# Patient Record
Sex: Male | Born: 1950
Health system: Southern US, Community
[De-identification: ages and names within clinical notes are randomized; demographics above are authoritative.]

## PROBLEM LIST (undated history)

## (undated) DIAGNOSIS — E785 Hyperlipidemia, unspecified: Secondary | ICD-10-CM

## (undated) DIAGNOSIS — I4430 Unspecified atrioventricular block: Secondary | ICD-10-CM

## (undated) DIAGNOSIS — D696 Thrombocytopenia, unspecified: Secondary | ICD-10-CM

## (undated) DIAGNOSIS — Z720 Tobacco use: Secondary | ICD-10-CM

## (undated) DIAGNOSIS — F329 Major depressive disorder, single episode, unspecified: Secondary | ICD-10-CM

## (undated) DIAGNOSIS — G4733 Obstructive sleep apnea (adult) (pediatric): Secondary | ICD-10-CM

## (undated) DIAGNOSIS — F32A Depression, unspecified: Secondary | ICD-10-CM

## (undated) DIAGNOSIS — G4730 Sleep apnea, unspecified: Secondary | ICD-10-CM

## (undated) DIAGNOSIS — R001 Bradycardia, unspecified: Secondary | ICD-10-CM

## (undated) DIAGNOSIS — Z5189 Encounter for other specified aftercare: Secondary | ICD-10-CM

## (undated) DIAGNOSIS — F17200 Nicotine dependence, unspecified, uncomplicated: Secondary | ICD-10-CM

## (undated) DIAGNOSIS — J449 Chronic obstructive pulmonary disease, unspecified: Secondary | ICD-10-CM

## (undated) DIAGNOSIS — R000 Tachycardia, unspecified: Secondary | ICD-10-CM

## (undated) DIAGNOSIS — R6882 Decreased libido: Secondary | ICD-10-CM

## (undated) DIAGNOSIS — I10 Essential (primary) hypertension: Secondary | ICD-10-CM

## (undated) DIAGNOSIS — I2510 Atherosclerotic heart disease of native coronary artery without angina pectoris: Secondary | ICD-10-CM

## (undated) HISTORY — DX: Obstructive sleep apnea (adult) (pediatric): G47.33

## (undated) HISTORY — DX: Unspecified atrioventricular block: I44.30

## (undated) HISTORY — PX: HIP SURGERY: SHX245

## (undated) HISTORY — DX: Tachycardia, unspecified: R00.0

## (undated) HISTORY — DX: Hyperlipidemia, unspecified: E78.5

## (undated) HISTORY — DX: Sleep apnea, unspecified: G47.30

## (undated) HISTORY — DX: Encounter for other specified aftercare: Z51.89

## (undated) HISTORY — DX: Decreased libido: R68.82

## (undated) HISTORY — DX: Nicotine dependence, unspecified, uncomplicated: F17.200

## (undated) HISTORY — DX: Tobacco use: Z72.0

## (undated) HISTORY — DX: Morbid (severe) obesity due to excess calories: E66.01

## (undated) HISTORY — DX: Bradycardia, unspecified: R00.1

## (undated) HISTORY — DX: Essential (primary) hypertension: I10

## (undated) HISTORY — DX: Depression, unspecified: F32.A

## (undated) HISTORY — PX: OTHER SURGICAL HISTORY: SHX169

## (undated) HISTORY — DX: Major depressive disorder, single episode, unspecified: F32.9

---
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
## 2000-10-01 ENCOUNTER — Emergency Department (HOSPITAL_COMMUNITY): Admission: EM | Admit: 2000-10-01 | Discharge: 2000-10-01 | Payer: Self-pay | Admitting: Emergency Medicine

## 2006-05-22 HISTORY — PX: COLONOSCOPY: SHX174

## 2007-03-13 ENCOUNTER — Ambulatory Visit: Payer: Self-pay | Admitting: Oncology

## 2007-03-20 LAB — CBC WITH DIFFERENTIAL/PLATELET
BASO%: 0 % (ref 0.0–2.0)
EOS%: 1.7 % (ref 0.0–7.0)
HGB: 13.7 g/dL (ref 13.0–17.1)
MCH: 32 pg (ref 28.0–33.4)
MCHC: 34.2 g/dL (ref 32.0–35.9)
RBC: 4.28 10*6/uL (ref 4.20–5.71)
RDW: 14.2 % (ref 11.2–14.6)
lymph#: 3.2 10*3/uL (ref 0.9–3.3)

## 2007-03-20 LAB — CHCC SMEAR

## 2007-04-01 LAB — COMPREHENSIVE METABOLIC PANEL
ALT: 35 U/L (ref 0–53)
AST: 22 U/L (ref 0–37)
Albumin: 4.3 g/dL (ref 3.5–5.2)
Alkaline Phosphatase: 43 U/L (ref 39–117)
Calcium: 9.4 mg/dL (ref 8.4–10.5)
Chloride: 104 mEq/L (ref 96–112)
Potassium: 4.2 mEq/L (ref 3.5–5.3)
Sodium: 144 mEq/L (ref 135–145)

## 2007-05-14 ENCOUNTER — Ambulatory Visit: Payer: Self-pay | Admitting: Oncology

## 2007-05-16 ENCOUNTER — Ambulatory Visit (HOSPITAL_COMMUNITY): Admission: RE | Admit: 2007-05-16 | Discharge: 2007-05-16 | Payer: Self-pay | Admitting: Oncology

## 2007-05-16 LAB — CBC WITH DIFFERENTIAL/PLATELET
Basophils Absolute: 0 10*3/uL (ref 0.0–0.1)
Eosinophils Absolute: 0.2 10*3/uL (ref 0.0–0.5)
HCT: 42 % (ref 38.7–49.9)
HGB: 14.3 g/dL (ref 13.0–17.1)
LYMPH%: 47.9 % (ref 14.0–48.0)
MCV: 94.6 fL (ref 81.6–98.0)
MONO%: 5.3 % (ref 0.0–13.0)
NEUT#: 3 10*3/uL (ref 1.5–6.5)
NEUT%: 43.2 % (ref 40.0–75.0)
Platelets: 719 10*3/uL — ABNORMAL HIGH (ref 145–400)

## 2007-05-29 LAB — CBC WITH DIFFERENTIAL/PLATELET
BASO%: 0.5 % (ref 0.0–2.0)
HCT: 41.1 % (ref 38.7–49.9)
HGB: 14.1 g/dL (ref 13.0–17.1)
MCHC: 34.4 g/dL (ref 32.0–35.9)
MONO#: 0.4 10*3/uL (ref 0.1–0.9)
NEUT%: 45.7 % (ref 40.0–75.0)
WBC: 5.5 10*3/uL (ref 4.0–10.0)
lymph#: 2.5 10*3/uL (ref 0.9–3.3)

## 2007-05-29 LAB — COMPREHENSIVE METABOLIC PANEL
ALT: 27 U/L (ref 0–53)
Albumin: 4.6 g/dL (ref 3.5–5.2)
CO2: 25 mEq/L (ref 19–32)
Calcium: 9.7 mg/dL (ref 8.4–10.5)
Chloride: 107 mEq/L (ref 96–112)
Creatinine, Ser: 1.55 mg/dL — ABNORMAL HIGH (ref 0.40–1.50)
Potassium: 4.6 mEq/L (ref 3.5–5.3)
Total Protein: 7 g/dL (ref 6.0–8.3)

## 2007-05-29 LAB — LACTATE DEHYDROGENASE: LDH: 203 U/L (ref 94–250)

## 2007-06-12 LAB — COMPREHENSIVE METABOLIC PANEL
ALT: 32 U/L (ref 0–53)
AST: 19 U/L (ref 0–37)
Albumin: 4.3 g/dL (ref 3.5–5.2)
BUN: 22 mg/dL (ref 6–23)
Calcium: 9.7 mg/dL (ref 8.4–10.5)
Chloride: 107 mEq/L (ref 96–112)
Potassium: 4.5 mEq/L (ref 3.5–5.3)
Total Protein: 6.7 g/dL (ref 6.0–8.3)

## 2007-06-12 LAB — CBC WITH DIFFERENTIAL/PLATELET
BASO%: 0.6 % (ref 0.0–2.0)
Basophils Absolute: 0 10*3/uL (ref 0.0–0.1)
EOS%: 2.2 % (ref 0.0–7.0)
HGB: 13.8 g/dL (ref 13.0–17.1)
MCH: 32.3 pg (ref 28.0–33.4)
NEUT#: 3.4 10*3/uL (ref 1.5–6.5)
RDW: 14.4 % (ref 11.2–14.6)
lymph#: 3.1 10*3/uL (ref 0.9–3.3)

## 2007-06-19 LAB — CBC WITH DIFFERENTIAL/PLATELET
Basophils Absolute: 0 10*3/uL (ref 0.0–0.1)
EOS%: 2.2 % (ref 0.0–7.0)
HCT: 39.4 % (ref 38.7–49.9)
HGB: 13.5 g/dL (ref 13.0–17.1)
MCH: 32.3 pg (ref 28.0–33.4)
MCV: 94.1 fL (ref 81.6–98.0)
MONO%: 6.6 % (ref 0.0–13.0)
NEUT%: 38.5 % — ABNORMAL LOW (ref 40.0–75.0)

## 2007-06-24 ENCOUNTER — Ambulatory Visit: Payer: Self-pay | Admitting: Oncology

## 2007-06-26 LAB — CBC WITH DIFFERENTIAL/PLATELET
Basophils Absolute: 0.1 10*3/uL (ref 0.0–0.1)
EOS%: 2 % (ref 0.0–7.0)
HGB: 14.3 g/dL (ref 13.0–17.1)
LYMPH%: 48.3 % — ABNORMAL HIGH (ref 14.0–48.0)
MCH: 32.9 pg (ref 28.0–33.4)
MCV: 94.8 fL (ref 81.6–98.0)
MONO%: 6 % (ref 0.0–13.0)
RDW: 14.9 % — ABNORMAL HIGH (ref 11.2–14.6)

## 2007-06-26 LAB — COMPREHENSIVE METABOLIC PANEL
AST: 21 U/L (ref 0–37)
Albumin: 4.5 g/dL (ref 3.5–5.2)
Alkaline Phosphatase: 45 U/L (ref 39–117)
BUN: 23 mg/dL (ref 6–23)
Creatinine, Ser: 1.47 mg/dL (ref 0.40–1.50)
Potassium: 4.3 mEq/L (ref 3.5–5.3)
Total Bilirubin: 0.3 mg/dL (ref 0.3–1.2)

## 2007-07-10 LAB — CBC WITH DIFFERENTIAL/PLATELET
Basophils Absolute: 0 10*3/uL (ref 0.0–0.1)
EOS%: 2.1 % (ref 0.0–7.0)
Eosinophils Absolute: 0.1 10*3/uL (ref 0.0–0.5)
HGB: 13.9 g/dL (ref 13.0–17.1)
LYMPH%: 48.2 % — ABNORMAL HIGH (ref 14.0–48.0)
MCH: 33 pg (ref 28.0–33.4)
MCV: 95.9 fL (ref 81.6–98.0)
MONO%: 6.4 % (ref 0.0–13.0)
Platelets: 662 10*3/uL — ABNORMAL HIGH (ref 145–400)
RDW: 15.6 % — ABNORMAL HIGH (ref 11.2–14.6)

## 2007-07-17 LAB — CBC WITH DIFFERENTIAL/PLATELET
BASO%: 0.3 % (ref 0.0–2.0)
EOS%: 1.5 % (ref 0.0–7.0)
Eosinophils Absolute: 0.1 10*3/uL (ref 0.0–0.5)
LYMPH%: 47.1 % (ref 14.0–48.0)
MCH: 33.5 pg — ABNORMAL HIGH (ref 28.0–33.4)
MCHC: 34.8 g/dL (ref 32.0–35.9)
MCV: 96.3 fL (ref 81.6–98.0)
MONO%: 7.2 % (ref 0.0–13.0)
NEUT#: 2.9 10*3/uL (ref 1.5–6.5)
Platelets: 643 10*3/uL — ABNORMAL HIGH (ref 145–400)
RBC: 4.12 10*6/uL — ABNORMAL LOW (ref 4.20–5.71)
RDW: 15.5 % — ABNORMAL HIGH (ref 11.2–14.6)

## 2007-07-24 LAB — CBC WITH DIFFERENTIAL/PLATELET
BASO%: 0.5 % (ref 0.0–2.0)
Eosinophils Absolute: 0.1 10*3/uL (ref 0.0–0.5)
LYMPH%: 44.9 % (ref 14.0–48.0)
MCHC: 35.1 g/dL (ref 32.0–35.9)
MONO#: 0.4 10*3/uL (ref 0.1–0.9)
NEUT#: 2.8 10*3/uL (ref 1.5–6.5)
Platelets: 697 10*3/uL — ABNORMAL HIGH (ref 145–400)
RBC: 4.19 10*6/uL — ABNORMAL LOW (ref 4.20–5.71)
RDW: 15.8 % — ABNORMAL HIGH (ref 11.2–14.6)
WBC: 6.2 10*3/uL (ref 4.0–10.0)
lymph#: 2.8 10*3/uL (ref 0.9–3.3)

## 2007-08-04 ENCOUNTER — Ambulatory Visit: Payer: Self-pay | Admitting: Oncology

## 2007-08-07 LAB — COMPREHENSIVE METABOLIC PANEL
ALT: 27 U/L (ref 0–53)
BUN: 20 mg/dL (ref 6–23)
CO2: 24 mEq/L (ref 19–32)
Calcium: 9.4 mg/dL (ref 8.4–10.5)
Chloride: 107 mEq/L (ref 96–112)
Creatinine, Ser: 1.45 mg/dL (ref 0.40–1.50)
Glucose, Bld: 91 mg/dL (ref 70–99)

## 2007-08-07 LAB — CBC WITH DIFFERENTIAL/PLATELET
Basophils Absolute: 0 10*3/uL (ref 0.0–0.1)
Eosinophils Absolute: 0.1 10*3/uL (ref 0.0–0.5)
HCT: 40.5 % (ref 38.7–49.9)
HGB: 14 g/dL (ref 13.0–17.1)
MONO#: 0.3 10*3/uL (ref 0.1–0.9)
NEUT%: 42.3 % (ref 40.0–75.0)
WBC: 6.5 10*3/uL (ref 4.0–10.0)
lymph#: 3.3 10*3/uL (ref 0.9–3.3)

## 2007-08-07 LAB — LACTATE DEHYDROGENASE: LDH: 186 U/L (ref 94–250)

## 2007-08-14 LAB — CBC WITH DIFFERENTIAL/PLATELET
EOS%: 1.8 % (ref 0.0–7.0)
LYMPH%: 43.4 % (ref 14.0–48.0)
MCH: 33.1 pg (ref 28.0–33.4)
MCHC: 34.7 g/dL (ref 32.0–35.9)
MCV: 95.3 fL (ref 81.6–98.0)
MONO%: 5.4 % (ref 0.0–13.0)
Platelets: 716 10*3/uL — ABNORMAL HIGH (ref 145–400)
RBC: 4.22 10*6/uL (ref 4.20–5.71)
RDW: 15 % — ABNORMAL HIGH (ref 11.2–14.6)

## 2007-09-04 LAB — CBC WITH DIFFERENTIAL/PLATELET
BASO%: 0.3 % (ref 0.0–2.0)
EOS%: 2.6 % (ref 0.0–7.0)
MCH: 32.7 pg (ref 28.0–33.4)
MCHC: 34.1 g/dL (ref 32.0–35.9)
MCV: 95.9 fL (ref 81.6–98.0)
MONO%: 7 % (ref 0.0–13.0)
RBC: 4.09 10*6/uL — ABNORMAL LOW (ref 4.20–5.71)
RDW: 14.4 % (ref 11.2–14.6)
lymph#: 3.2 10*3/uL (ref 0.9–3.3)

## 2007-09-04 LAB — COMPREHENSIVE METABOLIC PANEL
ALT: 28 U/L (ref 0–53)
AST: 25 U/L (ref 0–37)
Albumin: 4.4 g/dL (ref 3.5–5.2)
Alkaline Phosphatase: 45 U/L (ref 39–117)
Calcium: 9.5 mg/dL (ref 8.4–10.5)
Chloride: 107 mEq/L (ref 96–112)
Creatinine, Ser: 1.42 mg/dL (ref 0.40–1.50)
Potassium: 4.1 mEq/L (ref 3.5–5.3)

## 2007-09-11 LAB — CBC WITH DIFFERENTIAL/PLATELET
BASO%: 0.3 % (ref 0.0–2.0)
EOS%: 2.3 % (ref 0.0–7.0)
LYMPH%: 43.7 % (ref 14.0–48.0)
MCHC: 34.1 g/dL (ref 32.0–35.9)
MONO#: 0.3 10*3/uL (ref 0.1–0.9)
Platelets: 614 10*3/uL — ABNORMAL HIGH (ref 145–400)
RBC: 3.99 10*6/uL — ABNORMAL LOW (ref 4.20–5.71)
WBC: 7 10*3/uL (ref 4.0–10.0)
lymph#: 3.1 10*3/uL (ref 0.9–3.3)

## 2007-09-15 ENCOUNTER — Ambulatory Visit: Payer: Self-pay | Admitting: Oncology

## 2007-09-18 LAB — CBC WITH DIFFERENTIAL/PLATELET
BASO%: 1.5 % (ref 0.0–2.0)
HCT: 39 % (ref 38.7–49.9)
MCHC: 34.9 g/dL (ref 32.0–35.9)
MONO#: 0.4 10*3/uL (ref 0.1–0.9)
NEUT%: 41.3 % (ref 40.0–75.0)
RBC: 4.08 10*6/uL — ABNORMAL LOW (ref 4.20–5.71)
RDW: 12.9 % (ref 11.2–14.6)
WBC: 7.5 10*3/uL (ref 4.0–10.0)
lymph#: 3.7 10*3/uL — ABNORMAL HIGH (ref 0.9–3.3)

## 2007-09-25 LAB — CBC WITH DIFFERENTIAL/PLATELET
BASO%: 0.5 % (ref 0.0–2.0)
EOS%: 2.2 % (ref 0.0–7.0)
MCH: 32.9 pg (ref 28.0–33.4)
MCHC: 34.4 g/dL (ref 32.0–35.9)
MONO%: 6.3 % (ref 0.0–13.0)
NEUT%: 46 % (ref 40.0–75.0)
RDW: 14 % (ref 11.2–14.6)
lymph#: 3 10*3/uL (ref 0.9–3.3)

## 2007-10-02 LAB — CBC WITH DIFFERENTIAL/PLATELET
BASO%: 0.3 % (ref 0.0–2.0)
Basophils Absolute: 0 10*3/uL (ref 0.0–0.1)
EOS%: 2.6 % (ref 0.0–7.0)
HGB: 13.8 g/dL (ref 13.0–17.1)
MCH: 32.7 pg (ref 28.0–33.4)
RBC: 4.2 10*6/uL (ref 4.20–5.71)
RDW: 13.8 % (ref 11.2–14.6)
lymph#: 3.3 10*3/uL (ref 0.9–3.3)

## 2007-10-02 LAB — COMPREHENSIVE METABOLIC PANEL
ALT: 22 U/L (ref 0–53)
AST: 18 U/L (ref 0–37)
Albumin: 4.4 g/dL (ref 3.5–5.2)
Alkaline Phosphatase: 47 U/L (ref 39–117)
BUN: 23 mg/dL (ref 6–23)
Calcium: 9.5 mg/dL (ref 8.4–10.5)
Chloride: 108 mEq/L (ref 96–112)
Potassium: 4.1 mEq/L (ref 3.5–5.3)
Sodium: 144 mEq/L (ref 135–145)
Total Protein: 6.9 g/dL (ref 6.0–8.3)

## 2007-10-16 LAB — CBC WITH DIFFERENTIAL/PLATELET
Basophils Absolute: 0 10*3/uL (ref 0.0–0.1)
Eosinophils Absolute: 0.2 10*3/uL (ref 0.0–0.5)
HGB: 13.5 g/dL (ref 13.0–17.1)
LYMPH%: 44.7 % (ref 14.0–48.0)
MCV: 95 fL (ref 81.6–98.0)
MONO%: 7.8 % (ref 0.0–13.0)
NEUT#: 3.8 10*3/uL (ref 1.5–6.5)
Platelets: 590 10*3/uL — ABNORMAL HIGH (ref 145–400)

## 2007-10-27 ENCOUNTER — Ambulatory Visit: Payer: Self-pay | Admitting: Oncology

## 2007-10-30 LAB — CBC WITH DIFFERENTIAL/PLATELET
BASO%: 1 % (ref 0.0–2.0)
LYMPH%: 41.1 % (ref 14.0–48.0)
MCHC: 33.5 g/dL (ref 32.0–35.9)
MCV: 94.9 fL (ref 81.6–98.0)
MONO%: 6.1 % (ref 0.0–13.0)
Platelets: 497 10*3/uL — ABNORMAL HIGH (ref 145–400)
RBC: 4.2 10*6/uL (ref 4.20–5.71)

## 2007-11-20 LAB — CBC WITH DIFFERENTIAL/PLATELET
Basophils Absolute: 0 10*3/uL (ref 0.0–0.1)
Eosinophils Absolute: 0.3 10*3/uL (ref 0.0–0.5)
HCT: 37.9 % — ABNORMAL LOW (ref 38.7–49.9)
HGB: 12.8 g/dL — ABNORMAL LOW (ref 13.0–17.1)
MCV: 95.4 fL (ref 81.6–98.0)
MONO%: 6 % (ref 0.0–13.0)
NEUT#: 3.9 10*3/uL (ref 1.5–6.5)
RDW: 13.9 % (ref 11.2–14.6)
lymph#: 3.3 10*3/uL (ref 0.9–3.3)

## 2007-12-08 ENCOUNTER — Ambulatory Visit: Payer: Self-pay | Admitting: Oncology

## 2008-01-01 LAB — CBC WITH DIFFERENTIAL/PLATELET
Eosinophils Absolute: 0.1 10*3/uL (ref 0.0–0.5)
HCT: 39 % (ref 38.7–49.9)
LYMPH%: 51.2 % — ABNORMAL HIGH (ref 14.0–48.0)
MCHC: 33.9 g/dL (ref 32.0–35.9)
MCV: 94.1 fL (ref 81.6–98.0)
MONO#: 0.5 10*3/uL (ref 0.1–0.9)
MONO%: 6.9 % (ref 0.0–13.0)
NEUT#: 2.7 10*3/uL (ref 1.5–6.5)
NEUT%: 39.6 % — ABNORMAL LOW (ref 40.0–75.0)
Platelets: 563 10*3/uL — ABNORMAL HIGH (ref 145–400)
WBC: 6.7 10*3/uL (ref 4.0–10.0)

## 2008-01-01 LAB — COMPREHENSIVE METABOLIC PANEL
CO2: 26 mEq/L (ref 19–32)
Creatinine, Ser: 1.39 mg/dL (ref 0.40–1.50)
Glucose, Bld: 89 mg/dL (ref 70–99)
Total Bilirubin: 0.4 mg/dL (ref 0.3–1.2)

## 2008-01-22 LAB — CBC WITH DIFFERENTIAL/PLATELET
Basophils Absolute: 0 10*3/uL (ref 0.0–0.1)
Eosinophils Absolute: 0.2 10*3/uL (ref 0.0–0.5)
HGB: 13.3 g/dL (ref 13.0–17.1)
LYMPH%: 46.6 % (ref 14.0–48.0)
MCV: 95.3 fL (ref 81.6–98.0)
MONO#: 0.5 10*3/uL (ref 0.1–0.9)
NEUT#: 3.1 10*3/uL (ref 1.5–6.5)
Platelets: 551 10*3/uL — ABNORMAL HIGH (ref 145–400)
RBC: 4.14 10*6/uL — ABNORMAL LOW (ref 4.20–5.71)
WBC: 7.2 10*3/uL (ref 4.0–10.0)

## 2008-02-10 ENCOUNTER — Ambulatory Visit: Payer: Self-pay | Admitting: Oncology

## 2008-02-12 LAB — CBC WITH DIFFERENTIAL/PLATELET
BASO%: 0.3 % (ref 0.0–2.0)
EOS%: 2.3 % (ref 0.0–7.0)
LYMPH%: 48.5 % — ABNORMAL HIGH (ref 14.0–48.0)
MCH: 32.3 pg (ref 28.0–33.4)
MCHC: 34.2 g/dL (ref 32.0–35.9)
MCV: 94.4 fL (ref 81.6–98.0)
MONO#: 0.4 10*3/uL (ref 0.1–0.9)
MONO%: 5.6 % (ref 0.0–13.0)
NEUT%: 43.3 % (ref 40.0–75.0)
Platelets: 585 10*3/uL — ABNORMAL HIGH (ref 145–400)
RBC: 4.12 10*6/uL — ABNORMAL LOW (ref 4.20–5.71)
WBC: 6.7 10*3/uL (ref 4.0–10.0)

## 2008-03-08 LAB — CBC WITH DIFFERENTIAL/PLATELET
BASO%: 0.6 % (ref 0.0–2.0)
EOS%: 1.7 % (ref 0.0–7.0)
MCH: 32.1 pg (ref 28.0–33.4)
MCHC: 33.8 g/dL (ref 32.0–35.9)
MONO#: 0.5 10*3/uL (ref 0.1–0.9)
RBC: 4.26 10*6/uL (ref 4.20–5.71)
RDW: 14.5 % (ref 11.2–14.6)
WBC: 7.1 10*3/uL (ref 4.0–10.0)
lymph#: 3.1 10*3/uL (ref 0.9–3.3)

## 2008-03-08 LAB — COMPREHENSIVE METABOLIC PANEL
AST: 17 U/L (ref 0–37)
Albumin: 4.4 g/dL (ref 3.5–5.2)
Alkaline Phosphatase: 44 U/L (ref 39–117)
BUN: 20 mg/dL (ref 6–23)
Creatinine, Ser: 1.24 mg/dL (ref 0.40–1.50)
Glucose, Bld: 87 mg/dL (ref 70–99)
Potassium: 4.1 mEq/L (ref 3.5–5.3)
Total Bilirubin: 0.3 mg/dL (ref 0.3–1.2)

## 2008-03-30 ENCOUNTER — Ambulatory Visit: Payer: Self-pay | Admitting: Oncology

## 2008-04-05 LAB — CBC WITH DIFFERENTIAL/PLATELET
Eosinophils Absolute: 0.1 10*3/uL (ref 0.0–0.5)
HCT: 39.8 % (ref 38.7–49.9)
LYMPH%: 48.5 % — ABNORMAL HIGH (ref 14.0–48.0)
MCV: 94.5 fL (ref 81.6–98.0)
MONO%: 5.1 % (ref 0.0–13.0)
NEUT#: 3 10*3/uL (ref 1.5–6.5)
NEUT%: 44.6 % (ref 40.0–75.0)
Platelets: 616 10*3/uL — ABNORMAL HIGH (ref 145–400)
RBC: 4.21 10*6/uL (ref 4.20–5.71)

## 2008-04-14 ENCOUNTER — Encounter: Payer: Self-pay | Admitting: Family Medicine

## 2008-05-05 LAB — CBC WITH DIFFERENTIAL/PLATELET
Basophils Absolute: 0 10*3/uL (ref 0.0–0.1)
Eosinophils Absolute: 0.2 10*3/uL (ref 0.0–0.5)
HGB: 13 g/dL (ref 13.0–17.1)
LYMPH%: 41 % (ref 14.0–48.0)
MCV: 94.6 fL (ref 81.6–98.0)
MONO%: 7.7 % (ref 0.0–13.0)
NEUT#: 3.3 10*3/uL (ref 1.5–6.5)
Platelets: 554 10*3/uL — ABNORMAL HIGH (ref 145–400)
RDW: 14.3 % (ref 11.2–14.6)

## 2008-06-01 ENCOUNTER — Ambulatory Visit: Payer: Self-pay | Admitting: Oncology

## 2008-06-03 ENCOUNTER — Encounter: Payer: Self-pay | Admitting: Family Medicine

## 2008-06-03 LAB — CBC WITH DIFFERENTIAL/PLATELET
Basophils Absolute: 0 10*3/uL (ref 0.0–0.1)
EOS%: 2.5 % (ref 0.0–7.0)
HCT: 40.4 % (ref 38.4–49.9)
HGB: 13.6 g/dL (ref 13.0–17.1)
LYMPH%: 45.6 % (ref 14.0–49.0)
MCH: 30.6 pg (ref 27.2–33.4)
MCV: 91 fL (ref 79.3–98.0)
MONO%: 6.9 % (ref 0.0–14.0)
NEUT%: 44.8 % (ref 39.0–75.0)
Platelets: 497 10*3/uL — ABNORMAL HIGH (ref 140–400)

## 2008-06-03 LAB — COMPREHENSIVE METABOLIC PANEL
Alkaline Phosphatase: 53 U/L (ref 39–117)
BUN: 18 mg/dL (ref 6–23)
Creatinine, Ser: 1.31 mg/dL (ref 0.40–1.50)
Glucose, Bld: 67 mg/dL — ABNORMAL LOW (ref 70–99)
Total Bilirubin: 0.3 mg/dL (ref 0.3–1.2)

## 2008-07-22 ENCOUNTER — Ambulatory Visit: Payer: Self-pay | Admitting: Oncology

## 2008-07-26 LAB — CBC WITH DIFFERENTIAL/PLATELET
Basophils Absolute: 0 10*3/uL (ref 0.0–0.1)
EOS%: 2.7 % (ref 0.0–7.0)
HCT: 39.5 % (ref 38.4–49.9)
HGB: 13.2 g/dL (ref 13.0–17.1)
MCH: 31.4 pg (ref 27.2–33.4)
MONO#: 0.3 10*3/uL (ref 0.1–0.9)
NEUT%: 50 % (ref 39.0–75.0)
lymph#: 2.9 10*3/uL (ref 0.9–3.3)

## 2008-08-31 ENCOUNTER — Ambulatory Visit: Payer: Self-pay | Admitting: Oncology

## 2008-09-02 ENCOUNTER — Encounter: Payer: Self-pay | Admitting: Family Medicine

## 2008-09-02 LAB — COMPREHENSIVE METABOLIC PANEL
ALT: 31 U/L (ref 0–53)
CO2: 26 mEq/L (ref 19–32)
Creatinine, Ser: 1.31 mg/dL (ref 0.40–1.50)
Total Bilirubin: 0.3 mg/dL (ref 0.3–1.2)

## 2008-09-02 LAB — CBC WITH DIFFERENTIAL/PLATELET
BASO%: 0.6 % (ref 0.0–2.0)
EOS%: 1.8 % (ref 0.0–7.0)
HCT: 38.1 % — ABNORMAL LOW (ref 38.4–49.9)
LYMPH%: 40.2 % (ref 14.0–49.0)
MCH: 31.6 pg (ref 27.2–33.4)
MCHC: 33.8 g/dL (ref 32.0–36.0)
MCV: 93.6 fL (ref 79.3–98.0)
MONO%: 4.1 % (ref 0.0–14.0)
NEUT%: 53.3 % (ref 39.0–75.0)
Platelets: 451 10*3/uL — ABNORMAL HIGH (ref 140–400)

## 2008-09-02 LAB — LACTATE DEHYDROGENASE: LDH: 190 U/L (ref 94–250)

## 2008-10-04 LAB — CBC WITH DIFFERENTIAL/PLATELET
BASO%: 3.1 % — ABNORMAL HIGH (ref 0.0–2.0)
EOS%: 1.9 % (ref 0.0–7.0)
HCT: 39.5 % (ref 38.4–49.9)
LYMPH%: 41.3 % (ref 14.0–49.0)
MCH: 32 pg (ref 27.2–33.4)
MCHC: 34.1 g/dL (ref 32.0–36.0)
MCV: 93.8 fL (ref 79.3–98.0)
MONO#: 0.4 10*3/uL (ref 0.1–0.9)
MONO%: 6.1 % (ref 0.0–14.0)
NEUT%: 47.6 % (ref 39.0–75.0)
Platelets: 507 10*3/uL — ABNORMAL HIGH (ref 140–400)
RBC: 4.2 10*6/uL (ref 4.20–5.82)
WBC: 6.8 10*3/uL (ref 4.0–10.3)

## 2008-10-28 ENCOUNTER — Ambulatory Visit: Payer: Self-pay | Admitting: Oncology

## 2008-11-02 LAB — CBC WITH DIFFERENTIAL/PLATELET
Basophils Absolute: 0 10*3/uL (ref 0.0–0.1)
EOS%: 2.1 % (ref 0.0–7.0)
Eosinophils Absolute: 0.1 10*3/uL (ref 0.0–0.5)
HGB: 13.7 g/dL (ref 13.0–17.1)
LYMPH%: 42.9 % (ref 14.0–49.0)
MCH: 31.1 pg (ref 27.2–33.4)
MCV: 89.8 fL (ref 79.3–98.0)
MONO%: 7.1 % (ref 0.0–14.0)
NEUT#: 3.2 10*3/uL (ref 1.5–6.5)
Platelets: 503 10*3/uL — ABNORMAL HIGH (ref 140–400)
RDW: 14.7 % — ABNORMAL HIGH (ref 11.0–14.6)

## 2008-12-02 ENCOUNTER — Ambulatory Visit: Payer: Self-pay | Admitting: Oncology

## 2008-12-08 LAB — CBC WITH DIFFERENTIAL/PLATELET
BASO%: 0.8 % (ref 0.0–2.0)
EOS%: 2.6 % (ref 0.0–7.0)
HCT: 41.3 % (ref 38.4–49.9)
LYMPH%: 42.9 % (ref 14.0–49.0)
MCH: 32 pg (ref 27.2–33.4)
MCHC: 33.7 g/dL (ref 32.0–36.0)
MCV: 94.9 fL (ref 79.3–98.0)
MONO%: 9.1 % (ref 0.0–14.0)
NEUT%: 44.6 % (ref 39.0–75.0)
Platelets: 426 10*3/uL — ABNORMAL HIGH (ref 140–400)

## 2009-01-03 ENCOUNTER — Ambulatory Visit: Payer: Self-pay | Admitting: Oncology

## 2009-01-03 LAB — CBC WITH DIFFERENTIAL/PLATELET
BASO%: 0.3 % (ref 0.0–2.0)
EOS%: 3 % (ref 0.0–7.0)
MCH: 32.1 pg (ref 27.2–33.4)
MCHC: 34 g/dL (ref 32.0–36.0)
MCV: 94.7 fL (ref 79.3–98.0)
MONO%: 7.5 % (ref 0.0–14.0)
RDW: 15 % — ABNORMAL HIGH (ref 11.0–14.6)
lymph#: 1.7 10*3/uL (ref 0.9–3.3)

## 2009-01-03 LAB — COMPREHENSIVE METABOLIC PANEL
ALT: 27 U/L (ref 0–53)
AST: 27 U/L (ref 0–37)
Albumin: 3.8 g/dL (ref 3.5–5.2)
Alkaline Phosphatase: 56 U/L (ref 39–117)
Calcium: 8.9 mg/dL (ref 8.4–10.5)
Chloride: 108 mEq/L (ref 96–112)
Creatinine, Ser: 1.32 mg/dL (ref 0.40–1.50)
Potassium: 3.4 mEq/L — ABNORMAL LOW (ref 3.5–5.3)

## 2009-01-04 ENCOUNTER — Encounter: Payer: Self-pay | Admitting: Family Medicine

## 2009-01-28 ENCOUNTER — Ambulatory Visit: Payer: Self-pay | Admitting: Oncology

## 2009-02-02 LAB — CBC WITH DIFFERENTIAL/PLATELET
BASO%: 0.4 % (ref 0.0–2.0)
Basophils Absolute: 0 10*3/uL (ref 0.0–0.1)
HCT: 41.2 % (ref 38.4–49.9)
MCH: 32 pg (ref 27.2–33.4)
NEUT#: 2.8 10*3/uL (ref 1.5–6.5)
NEUT%: 42.8 % (ref 39.0–75.0)
RBC: 4.29 10*6/uL (ref 4.20–5.82)

## 2009-02-25 ENCOUNTER — Ambulatory Visit: Payer: Self-pay | Admitting: Oncology

## 2009-03-28 ENCOUNTER — Ambulatory Visit: Payer: Self-pay | Admitting: Oncology

## 2009-03-31 LAB — CBC WITH DIFFERENTIAL/PLATELET
EOS%: 2.9 % (ref 0.0–7.0)
HGB: 13.7 g/dL (ref 13.0–17.1)
MCH: 31.8 pg (ref 27.2–33.4)
MCHC: 33.7 g/dL (ref 32.0–36.0)
MONO#: 0.5 10*3/uL (ref 0.1–0.9)
MONO%: 5.9 % (ref 0.0–14.0)
NEUT#: 3.7 10*3/uL (ref 1.5–6.5)
WBC: 7.8 10*3/uL (ref 4.0–10.3)

## 2009-04-29 ENCOUNTER — Ambulatory Visit: Payer: Self-pay | Admitting: Oncology

## 2009-05-03 LAB — CBC WITH DIFFERENTIAL/PLATELET
Basophils Absolute: 0.1 10*3/uL (ref 0.0–0.1)
LYMPH%: 36.4 % (ref 14.0–49.0)
MCV: 96.1 fL (ref 79.3–98.0)
MONO%: 7 % (ref 0.0–14.0)
NEUT%: 54 % (ref 39.0–75.0)
RDW: 14.8 % — ABNORMAL HIGH (ref 11.0–14.6)

## 2009-05-03 LAB — COMPREHENSIVE METABOLIC PANEL
Alkaline Phosphatase: 61 U/L (ref 39–117)
BUN: 17 mg/dL (ref 6–23)
Creatinine, Ser: 1.33 mg/dL (ref 0.40–1.50)
Potassium: 5 mEq/L (ref 3.5–5.3)
Sodium: 148 mEq/L — ABNORMAL HIGH (ref 135–145)
Total Protein: 7 g/dL (ref 6.0–8.3)

## 2009-05-03 LAB — LACTATE DEHYDROGENASE: LDH: 168 U/L (ref 94–250)

## 2009-06-29 ENCOUNTER — Ambulatory Visit: Payer: Self-pay | Admitting: Oncology

## 2009-07-01 LAB — CBC WITH DIFFERENTIAL/PLATELET
Basophils Absolute: 0 10*3/uL (ref 0.0–0.1)
Eosinophils Absolute: 0.2 10*3/uL (ref 0.0–0.5)
HCT: 40.8 % (ref 38.4–49.9)
LYMPH%: 45.2 % (ref 14.0–49.0)
MCH: 31.3 pg (ref 27.2–33.4)
MCHC: 33.8 g/dL (ref 32.0–36.0)
MCV: 92.5 fL (ref 79.3–98.0)
MONO#: 0.5 10*3/uL (ref 0.1–0.9)
RDW: 14.1 % (ref 11.0–14.6)
WBC: 7 10*3/uL (ref 4.0–10.3)
lymph#: 3.2 10*3/uL (ref 0.9–3.3)

## 2009-08-14 IMAGING — US US ABDOMEN COMPLETE
1 series · 13 of 25 positions shown · non-contrast
Comparison: No comparison.

CLINICAL DATA: Thrombocytopenia.
 ABDOMEN ULTRASOUND:
TECHNIQUE: Complete abdominal ultrasound examination was performed including evaluation of the liver, gallbladder, bile ducts, pancreas, kidneys, spleen, IVC, and abdominal aorta.

[Series 1: unknown · 0.33mm/px · 13 of 57 slices shown]
[im 1/57]
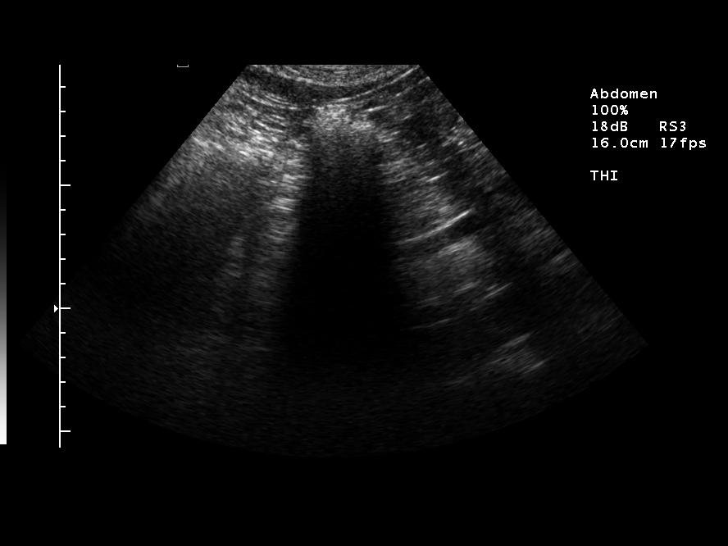
[im 5/57]
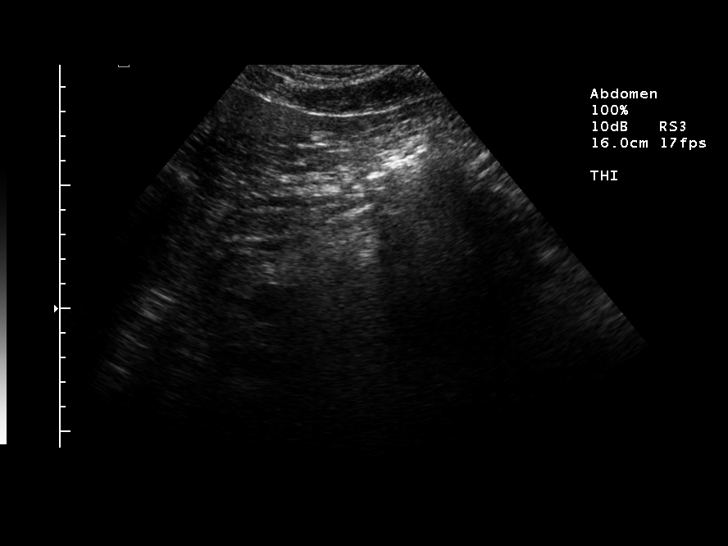
[im 10/57]
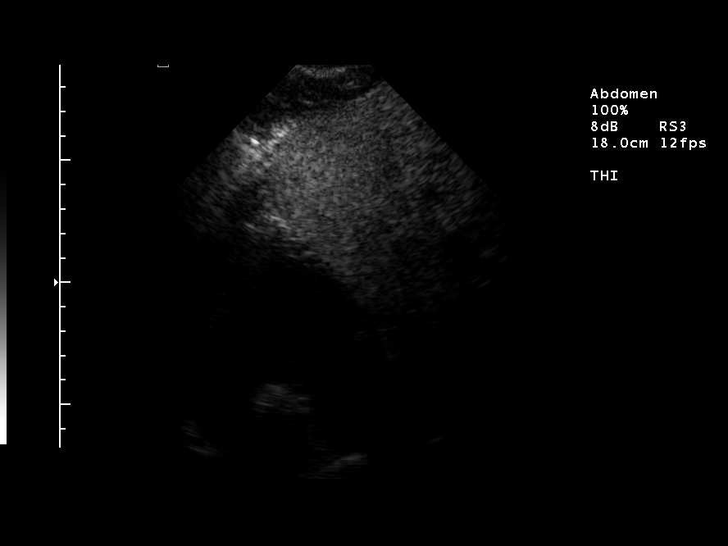
[im 15/57]
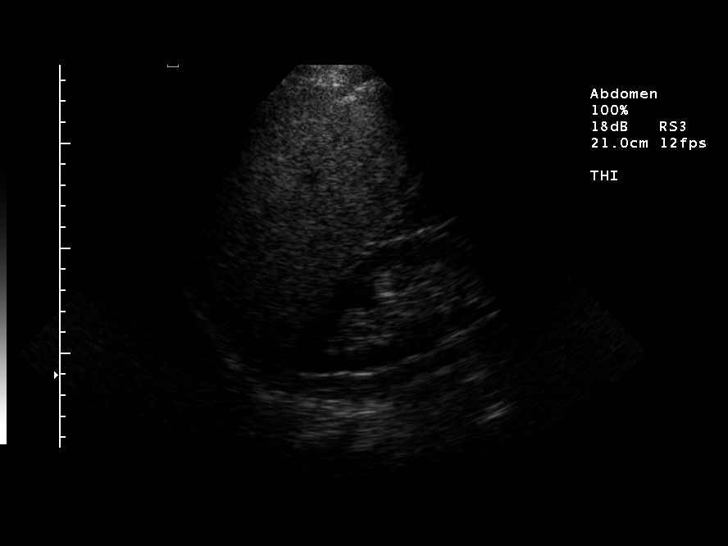
[im 19/57]
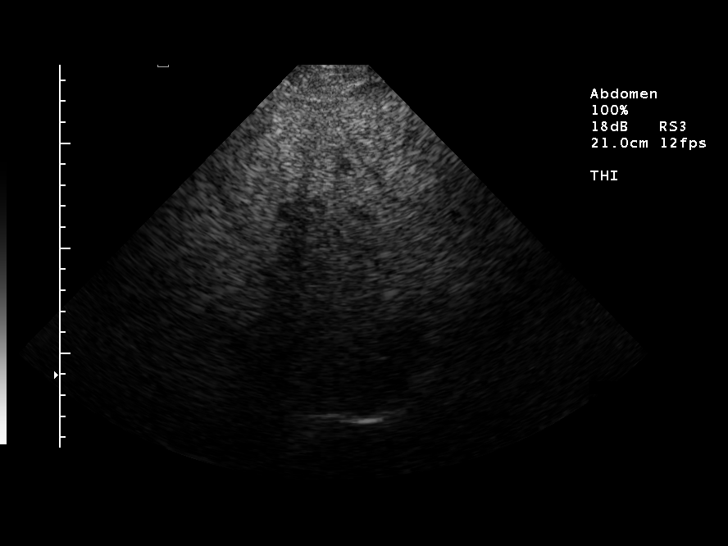
[im 24/57]
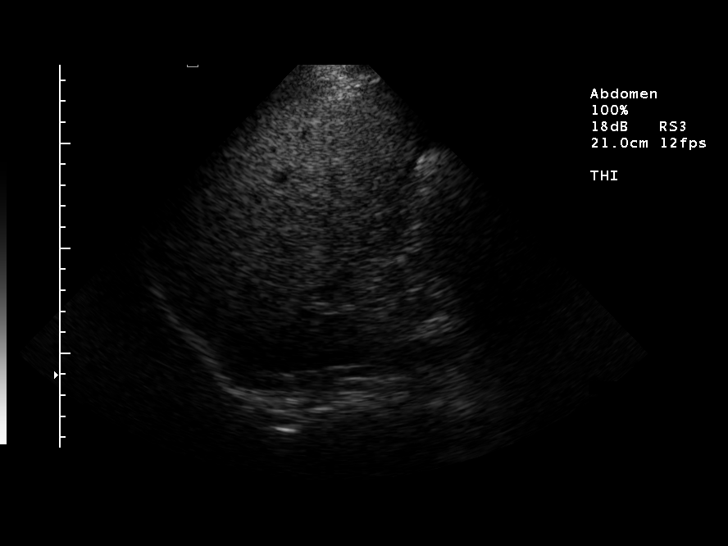
[im 29/57]
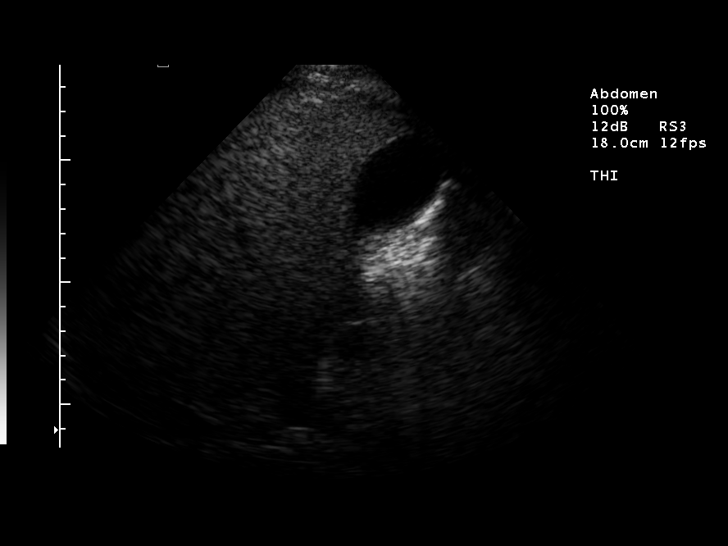
[im 33/57]
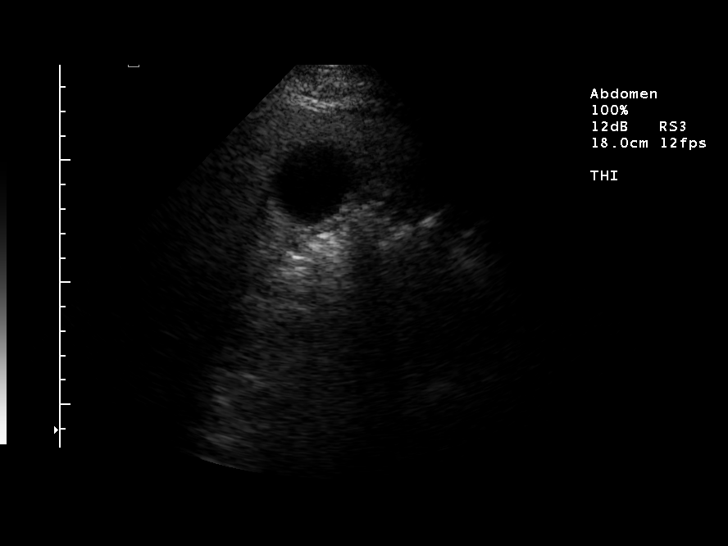
[im 38/57]
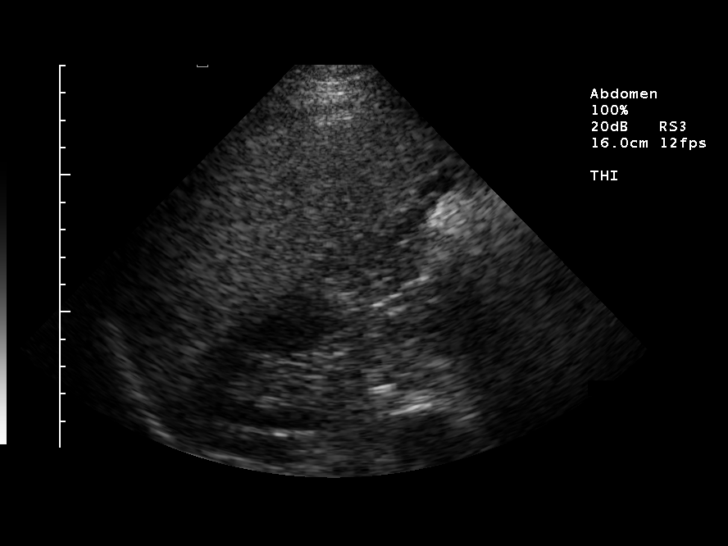
[im 43/57]
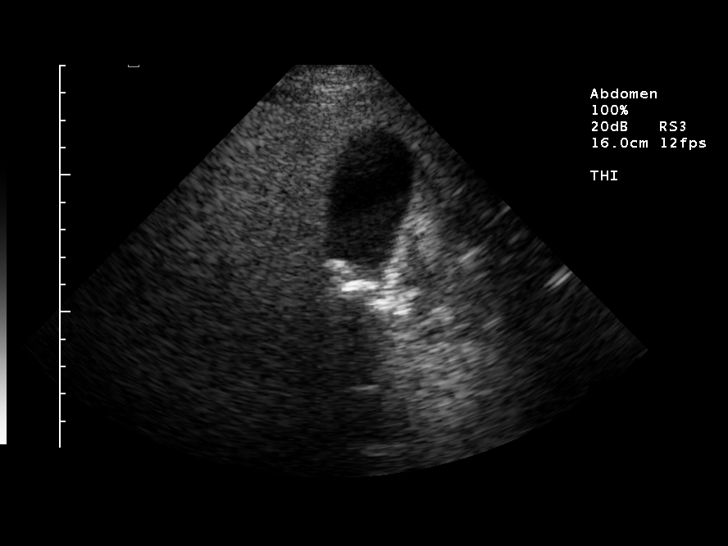
[im 47/57]
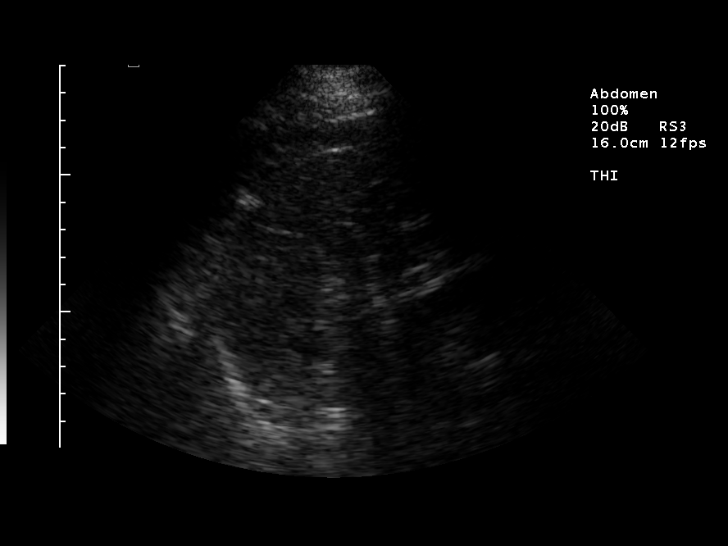
[im 52/57]
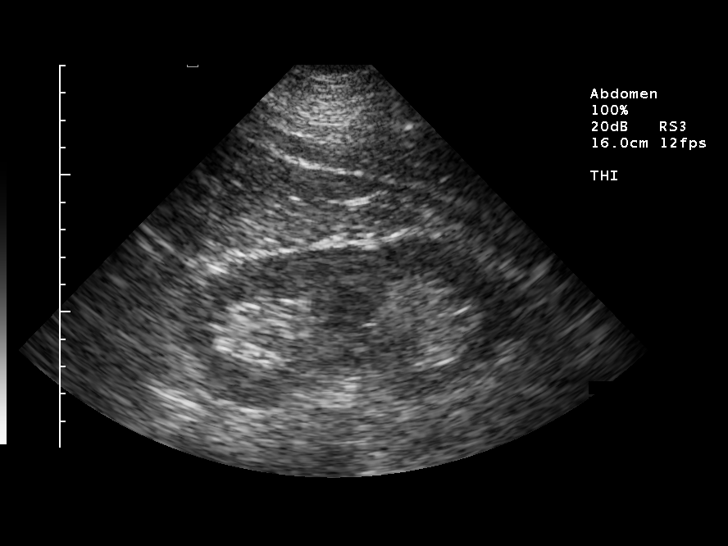
[im 57/57]
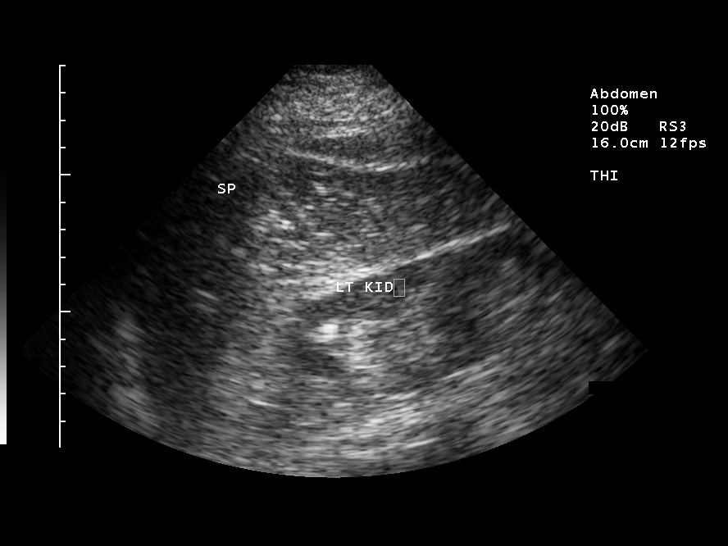

[13 of 25 positions shown; findings below may reference images not displayed]

FINDINGS: The examination is somewhat limited by the patient?s body habitus and intestinal bowel gas.  Small gallstones are present in the gallbladder neck.  These could not be dislodged by turning the patient.  However, the gallbladder does not appear distended and there is no gallbladder wall thickening.  The patient was not tender on imaging the gallbladder.  There is no biliary dilatation.  The liver demonstrates diffusely increased echogenicity without focal abnormality.  This is typically due to fatty infiltration.  The spleen is not enlarged and shows no focal abnormality.  
 The visualized portions of the pancreas, IVC, and abdominal aorta appear normal.  Both kidneys appear normal, measuring 12.5 cm in length on the right and 12.1 cm on the left.
IMPRESSION: 1.  No evidence of splenomegaly. 
 2.  Cholelithiasis.  There are possible stones impacted in the gallbladder neck, although there are no objective or subjective signs of cholecystitis.  Correlate clinically. 
 3.  Probable fatty infiltration of the liver.

## 2009-08-30 ENCOUNTER — Ambulatory Visit: Payer: Self-pay | Admitting: Oncology

## 2009-08-31 LAB — CBC WITH DIFFERENTIAL/PLATELET
EOS%: 2.1 % (ref 0.0–7.0)
LYMPH%: 47.7 % (ref 14.0–49.0)
MONO#: 0.4 10*3/uL (ref 0.1–0.9)
NEUT#: 2.8 10*3/uL (ref 1.5–6.5)
WBC: 6.6 10*3/uL (ref 4.0–10.3)

## 2009-09-21 ENCOUNTER — Emergency Department (HOSPITAL_COMMUNITY): Admission: EM | Admit: 2009-09-21 | Discharge: 2009-09-21 | Payer: Self-pay | Admitting: Emergency Medicine

## 2009-09-29 ENCOUNTER — Ambulatory Visit: Payer: Self-pay | Admitting: Family Medicine

## 2009-09-29 DIAGNOSIS — I10 Essential (primary) hypertension: Secondary | ICD-10-CM

## 2009-09-29 DIAGNOSIS — D759 Disease of blood and blood-forming organs, unspecified: Secondary | ICD-10-CM

## 2009-09-29 DIAGNOSIS — F17200 Nicotine dependence, unspecified, uncomplicated: Secondary | ICD-10-CM | POA: Insufficient documentation

## 2009-09-29 HISTORY — DX: Essential (primary) hypertension: I10

## 2009-09-29 HISTORY — DX: Nicotine dependence, unspecified, uncomplicated: F17.200

## 2009-11-02 ENCOUNTER — Ambulatory Visit: Payer: Self-pay | Admitting: Oncology

## 2009-11-04 ENCOUNTER — Encounter: Payer: Self-pay | Admitting: Family Medicine

## 2009-11-04 LAB — CBC WITH DIFFERENTIAL/PLATELET
BASO%: 0.6 % (ref 0.0–2.0)
HCT: 38.2 % — ABNORMAL LOW (ref 38.4–49.9)
HGB: 13.1 g/dL (ref 13.0–17.1)
MCV: 94.1 fL (ref 79.3–98.0)
MONO%: 6.3 % (ref 0.0–14.0)
Platelets: 428 10*3/uL — ABNORMAL HIGH (ref 140–400)
RBC: 4.06 10*6/uL — ABNORMAL LOW (ref 4.20–5.82)
RDW: 14.5 % (ref 11.0–14.6)

## 2009-11-04 LAB — COMPREHENSIVE METABOLIC PANEL
ALT: 24 U/L (ref 0–53)
AST: 19 U/L (ref 0–37)
Albumin: 4.3 g/dL (ref 3.5–5.2)
BUN: 18 mg/dL (ref 6–23)
CO2: 24 mEq/L (ref 19–32)
Calcium: 9.3 mg/dL (ref 8.4–10.5)
Creatinine, Ser: 1.45 mg/dL (ref 0.40–1.50)
Potassium: 4.2 mEq/L (ref 3.5–5.3)
Sodium: 144 mEq/L (ref 135–145)

## 2009-11-04 LAB — LACTATE DEHYDROGENASE: LDH: 176 U/L (ref 94–250)

## 2009-11-07 ENCOUNTER — Ambulatory Visit: Payer: Self-pay | Admitting: Family Medicine

## 2009-11-07 LAB — CONVERTED CEMR LAB
ALT: 26 units/L (ref 0–53)
AST: 23 units/L (ref 0–37)
Albumin: 4.2 g/dL (ref 3.5–5.2)
Alkaline Phosphatase: 67 units/L (ref 39–117)
BUN: 14 mg/dL (ref 6–23)
Basophils Absolute: 0 10*3/uL (ref 0.0–0.1)
Basophils Relative: 0.3 % (ref 0.0–3.0)
Bilirubin, Direct: 0.1 mg/dL (ref 0.0–0.3)
Blood in Urine, dipstick: NEGATIVE
CO2: 32 meq/L (ref 19–32)
Calcium: 9.3 mg/dL (ref 8.4–10.5)
Cholesterol: 231 mg/dL — ABNORMAL HIGH (ref 0–200)
Direct LDL: 159.3 mg/dL
Eosinophils Relative: 2.2 % (ref 0.0–5.0)
Glucose, Urine, Semiquant: NEGATIVE
HCT: 41 % (ref 39.0–52.0)
Lymphocytes Relative: 40.3 % (ref 12.0–46.0)
Lymphs Abs: 3 10*3/uL (ref 0.7–4.0)
MCV: 96.8 fL (ref 78.0–100.0)
Monocytes Absolute: 0.5 10*3/uL (ref 0.1–1.0)
Monocytes Relative: 6.8 % (ref 3.0–12.0)
Platelets: 445 10*3/uL — ABNORMAL HIGH (ref 150.0–400.0)
Protein, U semiquant: NEGATIVE
RBC: 4.23 M/uL (ref 4.22–5.81)
RDW: 14.9 % — ABNORMAL HIGH (ref 11.5–14.6)
TSH: 1.78 microintl units/mL (ref 0.35–5.50)
Total Bilirubin: 0.5 mg/dL (ref 0.3–1.2)
Total CHOL/HDL Ratio: 5
Urobilinogen, UA: 0.2

## 2009-11-14 ENCOUNTER — Ambulatory Visit: Payer: Self-pay | Admitting: Family Medicine

## 2009-11-14 DIAGNOSIS — R6882 Decreased libido: Secondary | ICD-10-CM | POA: Insufficient documentation

## 2009-11-14 HISTORY — DX: Decreased libido: R68.82

## 2009-12-13 ENCOUNTER — Ambulatory Visit: Payer: Self-pay | Admitting: Family Medicine

## 2009-12-23 ENCOUNTER — Telehealth: Payer: Self-pay | Admitting: Family Medicine

## 2009-12-28 ENCOUNTER — Ambulatory Visit: Payer: Self-pay | Admitting: Family Medicine

## 2010-01-03 ENCOUNTER — Ambulatory Visit: Payer: Self-pay | Admitting: Oncology

## 2010-01-05 LAB — CBC WITH DIFFERENTIAL/PLATELET
EOS%: 2.3 % (ref 0.0–7.0)
LYMPH%: 38.8 % (ref 14.0–49.0)
MCV: 93.2 fL (ref 79.3–98.0)
MONO#: 0.3 10*3/uL (ref 0.1–0.9)
MONO%: 4 % (ref 0.0–14.0)
NEUT%: 54.8 % (ref 39.0–75.0)
RBC: 4.38 10*6/uL (ref 4.20–5.82)
RDW: 14.4 % (ref 11.0–14.6)
WBC: 8.4 10*3/uL (ref 4.0–10.3)
lymph#: 3.3 10*3/uL (ref 0.9–3.3)
nRBC: 0 % (ref 0–0)

## 2010-01-24 ENCOUNTER — Telehealth: Payer: Self-pay | Admitting: Family Medicine

## 2010-01-25 ENCOUNTER — Ambulatory Visit: Payer: Self-pay | Admitting: Family Medicine

## 2010-02-27 ENCOUNTER — Ambulatory Visit: Payer: Self-pay | Admitting: Family Medicine

## 2010-03-03 ENCOUNTER — Ambulatory Visit: Payer: Self-pay | Admitting: Oncology

## 2010-03-27 ENCOUNTER — Ambulatory Visit: Payer: Self-pay | Admitting: Family Medicine

## 2010-03-27 DIAGNOSIS — M67919 Unspecified disorder of synovium and tendon, unspecified shoulder: Secondary | ICD-10-CM | POA: Insufficient documentation

## 2010-03-27 DIAGNOSIS — M719 Bursopathy, unspecified: Secondary | ICD-10-CM

## 2010-04-30 ENCOUNTER — Encounter (HOSPITAL_COMMUNITY): Payer: Self-pay | Admitting: Oncology

## 2010-05-01 ENCOUNTER — Telehealth: Payer: Self-pay | Admitting: Family Medicine

## 2010-05-01 ENCOUNTER — Ambulatory Visit: Admit: 2010-05-01 | Payer: Self-pay | Admitting: Family Medicine

## 2010-05-04 ENCOUNTER — Ambulatory Visit: Payer: Self-pay | Admitting: Oncology

## 2010-05-08 LAB — CBC WITH DIFFERENTIAL/PLATELET
Eosinophils Absolute: 0.2 10*3/uL (ref 0.0–0.5)
HCT: 41.3 % (ref 38.4–49.9)
HGB: 13.8 g/dL (ref 13.0–17.1)
MCH: 31.8 pg (ref 27.2–33.4)
MCV: 95.1 fL (ref 79.3–98.0)
NEUT#: 4 10*3/uL (ref 1.5–6.5)
Platelets: 508 10*3/uL — ABNORMAL HIGH (ref 140–400)
RBC: 4.34 10*6/uL (ref 4.20–5.82)
RDW: 15 % — ABNORMAL HIGH (ref 11.0–14.6)
WBC: 7.6 10*3/uL (ref 4.0–10.3)
lymph#: 2.9 10*3/uL (ref 0.9–3.3)

## 2010-05-08 LAB — COMPREHENSIVE METABOLIC PANEL
Calcium: 9.9 mg/dL (ref 8.4–10.5)
Potassium: 5.5 mEq/L — ABNORMAL HIGH (ref 3.5–5.3)
Total Bilirubin: 0.3 mg/dL (ref 0.3–1.2)

## 2010-05-09 NOTE — Letter (Signed)
Summary: Out of Work  Adult nurse at [facility]  [address]   [place], [place] [address]   Phone: [phone]  Fax: [phone]    December 13, 2009   Employee:  SEQUAN AUXIER    To Whom It May Concern:   For Medical reasons, please excuse the above named employee from work for the following dates:  Start:   12/13/2009  End:   12/13/2009  If you need additional information, please feel free to contact our office.         Sincerely,      Evelena Peat, MD

## 2010-05-09 NOTE — Progress Notes (Signed)
Summary: Testosterone questions  Phone Note Call from Patient   Caller: Patient Call For: Evelena Peat MD Summary of Call: Pt calling about testosterone refill to pharmacy.  i called pt pharmacy, he was disp a 10ml vial in September, so he should have enough for 9 more injections?  LMTCB to discuss further, or just schedule nurse visit fot inj. Initial call taken by: Sid Falcon LPN,  January 24, 2010 12:53 PM  Follow-up for Phone Call        Pt scheduled today for inj Follow-up by: Sid Falcon LPN,  January 25, 2010 8:59 AM

## 2010-05-09 NOTE — Letter (Signed)
Summary: Out of Work  Adult nurse at [facility]  [address]   [place], [place] [address]   Phone: [phone]  Fax: [phone]    January 25, 2010   Employee:  MARQUAVIUS SCAIFE    To Whom It May Concern:   For Medical reasons, please excuse the above named employee from work for the following dates:  Start:   01/25/2010  End:   01/25/2010  If you need additional information, please feel free to contact our office.         Sincerely,       Evelena Peat, MD

## 2010-05-09 NOTE — Letter (Signed)
[facility]   Imported By: Maryln Gottron 11/16/2009 14:55:06  _____________________________________________________________________  External Attachment:    Type:   Image     Comment:   External Document
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
## 2010-05-09 NOTE — Assessment & Plan Note (Signed)
Summary: testosterone inj/njr  Nurse Visit   Allergies: No Known Drug Allergies  Medication Administration  Injection # 1:    Medication: Testosterone Cypionat 200mg  ing    Diagnosis: LIBIDO, DECREASED (ICD-799.81)    Route: IM    Site: LUOQ gluteus    Exp Date: 09/08/2011    Lot #: [id number]    Mfr: Gaylyn Rong    Patient tolerated injection without complications    Given by: Sid Falcon LPN (January 25, 2010 12:33 PM)  Orders Added: 1)  Admin of patients own med IM/SQ [phone]

## 2010-05-09 NOTE — Letter (Signed)
Summary: Out of Work  [facility] at [facility]  [address]   [place], [place] [address]   Phone: [phone]  Fax: [phone]    February 27, 2010   Employee:  JOANDY BURGET    To Whom It May Concern:   For Medical reasons, please excuse the above named employee from work for the following dates:  Start:   02/27/10  End:   02/27/10  If you need additional information, please feel free to contact our office.         Sincerely,       Evelena Peat, MD
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
## 2010-05-09 NOTE — Letter (Signed)
[facility]   Imported By: Maryln Gottron 11/17/2009 12:56:16  _____________________________________________________________________  External Attachment:    Type:   Image     Comment:   External Document
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
## 2010-05-09 NOTE — Assessment & Plan Note (Signed)
Summary: TESTOSTERONE INJ/PER NANCY/CJR  Nurse Visit   Allergies: No Known Drug Allergies  Medication Administration  Injection # 1:    Medication: Testosterone Cypionat 200mg  ing    Diagnosis: LIBIDO, DECREASED (ICD-799.81)    Route: IM    Site: RUOQ gluteus    Exp Date: 07/09/2011    Lot #: [id number]    Mfr: Paddocks    Patient tolerated injection without complications    Given by: Sid Falcon LPN (December 28, 2009 12:15 PM)  Orders Added: 1)  Admin of patients own med IM/SQ [phone]

## 2010-05-09 NOTE — Progress Notes (Signed)
Summary: Testosterone inj questions  Phone Note Call from Patient   Caller: Patient Call For: Evelena Peat MD Summary of Call: Pt calling to clarify testosterone inj process.  He asked me to call [phone] (his work number) and when I called back, "this number is no longer in service".  Left a message on home phone testosterone will not be covered by his insurance, it will cost him $22.00.  Pt informed on home phone to call for nurse visit only. Initial call taken by: Sid Falcon LPN,  December 23, 2009 1:20 PM

## 2010-05-09 NOTE — Letter (Signed)
[facility]   Imported By: Maryln Gottron 11/16/2009 14:56:25  _____________________________________________________________________  External Attachment:    Type:   Image     Comment:   External Document

## 2010-05-09 NOTE — Letter (Signed)
Summary: Out of Work  Adult nurse at [facility] [employer]  [address]   [place], [place] [address]   Phone: [phone]  Fax: [phone]    November 07, 2009   Employee:  James Sawyer    To Whom It May Concern:   For Medical reasons, please excuse the above named employee from work for the following dates:  Start:   11/07/2009  End:   11/07/2009  If you need additional information, please feel free to contact our office.         Sincerely,       Evelena Peat, MD

## 2010-05-11 NOTE — Letter (Signed)
Summary: Out of Work  Adult nurse at [facility]  [address]   [place], [place] [address]   Phone: [phone]  Fax: [phone]    March 27, 2010   Employee:  James Sawyer    To Whom It May Concern:   For Medical reasons, please excuse the above named employee from work for the following dates:  Start:   03/27/2010  End:   03/27/2010  If you need additional information, please feel free to contact our office.         Sincerely,        Evelena Peat, MD
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
## 2010-05-17 NOTE — Progress Notes (Signed)
Summary:  pharmacy out of testoster inj med  Phone Note Call from Patient Call back at [facility] Phone [phone]   Caller: Patient Summary of Call: Pt called and was sch to come in today 12pm for testosterone inj, but pt had to cx because pharmacy in out of testosterone med. Pt is wondering if Dr. Caryl Never could recommend another place that may have this med? pls advise. Leave detailed vm at pts home #.  Initial call taken by: Lucy Antig[place],  May 01, 2010 11:50 AM  Follow-up for Phone Call        VM left on home phone he will need to call around to find who might have med, then call us back and I will call to that pharmacy Follow-up by: Sid Falcon LPN,  May 01, 2010 12:26 PM  Additional Follow-up for Phone Call Additional follow up Details #1::        Left another message for pt on home phone asking him if he still needs our assistance to please call back Additional Follow-up by: Sid Falcon LPN,  May 09, 2010 1:28 PM

## 2010-05-30 ENCOUNTER — Telehealth: Payer: Self-pay | Admitting: *Deleted

## 2010-05-30 ENCOUNTER — Encounter: Payer: Self-pay | Admitting: Family Medicine

## 2010-05-30 DIAGNOSIS — E291 Testicular hypofunction: Secondary | ICD-10-CM

## 2010-05-30 NOTE — Telephone Encounter (Signed)
BP noted.  OK to observe for now.

## 2010-05-30 NOTE — Telephone Encounter (Signed)
Pt requesting refill of Testosterone.  [facility] is out and reports it is on back-order.  I found 10ml testosterone at [facility].  Pt agreed to pay $112.00 out of pocket as his [employer] does not pay.  Med called in, pt returned for injection.  Pt also reporting the increase of Lisinopril to 20 mg "made him dizzy".  He only used it for about 3-4 day, and then he began breaking it in 1/2, so currently taking 1/2 daily.  I took him BP at nurse visit and it was 140/78.  Your advise please  Pt number [phone] at home

## 2010-05-31 NOTE — Telephone Encounter (Signed)
Called and spoke with wife - per dr Caryl Never , ok to monitor BP . Let him know if BP goes up or dizziness returns. KIK

## 2010-06-21 NOTE — Progress Notes (Signed)
This encounter was created in error - please disregard.

## 2010-06-25 LAB — URINALYSIS, ROUTINE W REFLEX MICROSCOPIC
Glucose, UA: NEGATIVE mg/dL
Nitrite: NEGATIVE
Protein, ur: NEGATIVE mg/dL
Specific Gravity, Urine: 1.025 (ref 1.005–1.030)
pH: 6 (ref 5.0–8.0)

## 2010-06-25 LAB — COMPREHENSIVE METABOLIC PANEL
ALT: 29 U/L (ref 0–53)
AST: 25 U/L (ref 0–37)
Alkaline Phosphatase: 62 U/L (ref 39–117)
CO2: 28 mEq/L (ref 19–32)
Creatinine, Ser: 1.27 mg/dL (ref 0.4–1.5)
Total Bilirubin: 0.4 mg/dL (ref 0.3–1.2)
Total Protein: 6.1 g/dL (ref 6.0–8.3)

## 2010-06-25 LAB — CK TOTAL AND CKMB (NOT AT ARMC)
CK, MB: 4.3 ng/mL — ABNORMAL HIGH (ref 0.3–4.0)
Relative Index: 0.8 (ref 0.0–2.5)
Total CK: 566 U/L — ABNORMAL HIGH (ref 7–232)

## 2010-06-25 LAB — POCT CARDIAC MARKERS
CKMB, poc: 3.5 ng/mL (ref 1.0–8.0)
Troponin i, poc: <0.05 ng/mL (ref 0.00–0.09)

## 2010-06-25 LAB — CBC
HCT: 39.8 % (ref 39.0–52.0)
Hemoglobin: 13.5 g/dL (ref 13.0–17.0)
MCHC: 33.9 g/dL (ref 30.0–36.0)
Platelets: 571 10*3/uL — ABNORMAL HIGH (ref 150–400)
RBC: 4.22 MIL/uL (ref 4.22–5.81)
WBC: 6.1 10*3/uL (ref 4.0–10.5)

## 2010-06-28 ENCOUNTER — Other Ambulatory Visit: Payer: Self-pay

## 2010-07-04 ENCOUNTER — Encounter: Payer: Self-pay | Admitting: Family Medicine

## 2010-07-05 ENCOUNTER — Telehealth: Payer: Self-pay | Admitting: *Deleted

## 2010-07-05 ENCOUNTER — Ambulatory Visit: Payer: Self-pay | Admitting: Family Medicine

## 2010-07-05 NOTE — Telephone Encounter (Signed)
No Show for 3 month follow-up.  Pt misplaced the appt card.  Pt will reschedule

## 2010-07-12 ENCOUNTER — Other Ambulatory Visit (INDEPENDENT_AMBULATORY_CARE_PROVIDER_SITE_OTHER): Payer: BC Managed Care – PPO | Admitting: Family Medicine

## 2010-07-12 DIAGNOSIS — R6882 Decreased libido: Secondary | ICD-10-CM

## 2010-07-18 ENCOUNTER — Encounter: Payer: Self-pay | Admitting: Family Medicine

## 2010-07-18 NOTE — Progress Notes (Signed)
Quick Note:  LMTCB on home VM ______

## 2010-07-19 ENCOUNTER — Ambulatory Visit (INDEPENDENT_AMBULATORY_CARE_PROVIDER_SITE_OTHER): Payer: BC Managed Care – PPO | Admitting: Family Medicine

## 2010-07-19 ENCOUNTER — Encounter: Payer: Self-pay | Admitting: Family Medicine

## 2010-07-19 VITALS — BP 148/90 | Temp 98.3°F | Ht 69.5 in | Wt 250.0 lb

## 2010-07-19 DIAGNOSIS — R6882 Decreased libido: Secondary | ICD-10-CM

## 2010-07-19 DIAGNOSIS — I10 Essential (primary) hypertension: Secondary | ICD-10-CM

## 2010-07-19 NOTE — Progress Notes (Signed)
Quick Note:  Pt has OV tomorrow, will review at OV ______

## 2010-07-19 NOTE — Progress Notes (Signed)
  Subjective:    Patient ID: JC VERON, male    DOB: 02-27-1951, 60 y.o.   MRN: [id number]  HPI Patient seen for followup regarding hypertension and low testosterone. Compliant with blood pressure medication. No side effects. No dizziness or orthostasis. Not monitoring blood pressure regularly at home.  Ongoing Fatigue Issues. Recent Total Testosterone 123. Has Been Receiving Intramuscular Injections but Not Consistently Each Month. Recently Lost Job but [employer] through June. No Chest Pains or Shortness of Breath.   Review of Systems  Constitutional: Positive for fatigue. Negative for fever, chills, activity change and unexpected weight change.  Eyes: Negative for visual disturbance.  Respiratory: Negative for cough, shortness of breath and wheezing.   Cardiovascular: Negative for chest pain, palpitations and leg swelling.  Gastrointestinal: Negative for abdominal pain.  Genitourinary: Negative for dysuria.  Neurological: Negative for dizziness, syncope and headaches.       Objective:   Physical Exam  Constitutional: He is oriented to person, place, and time. He appears well-developed and well-nourished. No distress.  Eyes: Pupils are equal, round, and reactive to light.  Cardiovascular: Normal rate, regular rhythm and normal heart sounds.   No murmur heard. Pulmonary/Chest: Effort normal and breath sounds normal. He has no wheezes. He has no rales.  Musculoskeletal: He exhibits no edema.  Neurological: He is alert and oriented to person, place, and time. No cranial nerve deficit.  Psychiatric: He has a normal mood and affect.          Assessment & Plan:  #1 hypertension poor control. Increase lisinopril to 20 mg daily. He had been taking one half tablet daily #2 hypogonadism. Increase testosterone to 200 mg IM every 2 weeks and reassess testosterone level in 2 months

## 2010-07-19 NOTE — Patient Instructions (Addendum)
Go up to one whole lisinopril tablet daily and be in touch if blood pressure not less than 140/90. We will increase your testosterone injections to one every 2 weeks and we need to get followup blood work in 2 months to reassess testosterone level

## 2010-08-02 ENCOUNTER — Ambulatory Visit: Payer: Self-pay | Admitting: Family Medicine

## 2010-08-02 DIAGNOSIS — R6882 Decreased libido: Secondary | ICD-10-CM

## 2010-08-02 MED ORDER — TESTOSTERONE CYPIONATE 200 MG/ML IM SOLN
200.0000 mg | INTRAMUSCULAR | Status: DC
  Administered 2010-08-02: 200 mg via INTRAMUSCULAR

## 2011-03-07 ENCOUNTER — Telehealth: Payer: Self-pay | Admitting: Medical Oncology

## 2011-03-07 NOTE — Telephone Encounter (Signed)
I attempted to call pt about an appt and copay.  He had applied for financial assistance with our office but he did not qualify for assistance. He is concerned about his co-pay.

## 2011-03-29 ENCOUNTER — Observation Stay (HOSPITAL_COMMUNITY)
Admission: EM | Admit: 2011-03-29 | Discharge: 2011-03-30 | Disposition: A | Discharge status: Home / Self Care | Payer: Self-pay | Attending: Internal Medicine | Admitting: Internal Medicine

## 2011-03-29 ENCOUNTER — Emergency Department (HOSPITAL_COMMUNITY): Payer: Self-pay

## 2011-03-29 ENCOUNTER — Encounter (HOSPITAL_COMMUNITY): Payer: Self-pay | Admitting: Emergency Medicine

## 2011-03-29 ENCOUNTER — Other Ambulatory Visit: Payer: Self-pay

## 2011-03-29 DIAGNOSIS — R109 Unspecified abdominal pain: Principal | ICD-10-CM | POA: Insufficient documentation

## 2011-03-29 DIAGNOSIS — R6882 Decreased libido: Secondary | ICD-10-CM

## 2011-03-29 DIAGNOSIS — R0902 Hypoxemia: Secondary | ICD-10-CM | POA: Insufficient documentation

## 2011-03-29 DIAGNOSIS — K5900 Constipation, unspecified: Secondary | ICD-10-CM | POA: Insufficient documentation

## 2011-03-29 DIAGNOSIS — R05 Cough: Secondary | ICD-10-CM | POA: Insufficient documentation

## 2011-03-29 DIAGNOSIS — R059 Cough, unspecified: Secondary | ICD-10-CM | POA: Insufficient documentation

## 2011-03-29 DIAGNOSIS — M719 Bursopathy, unspecified: Secondary | ICD-10-CM

## 2011-03-29 DIAGNOSIS — N179 Acute kidney failure, unspecified: Secondary | ICD-10-CM | POA: Insufficient documentation

## 2011-03-29 DIAGNOSIS — R748 Abnormal levels of other serum enzymes: Secondary | ICD-10-CM | POA: Insufficient documentation

## 2011-03-29 DIAGNOSIS — F17200 Nicotine dependence, unspecified, uncomplicated: Secondary | ICD-10-CM | POA: Insufficient documentation

## 2011-03-29 DIAGNOSIS — Z23 Encounter for immunization: Secondary | ICD-10-CM | POA: Insufficient documentation

## 2011-03-29 DIAGNOSIS — J441 Chronic obstructive pulmonary disease with (acute) exacerbation: Secondary | ICD-10-CM | POA: Diagnosis present

## 2011-03-29 DIAGNOSIS — I10 Essential (primary) hypertension: Secondary | ICD-10-CM | POA: Insufficient documentation

## 2011-03-29 DIAGNOSIS — R0602 Shortness of breath: Secondary | ICD-10-CM | POA: Insufficient documentation

## 2011-03-29 DIAGNOSIS — D759 Disease of blood and blood-forming organs, unspecified: Secondary | ICD-10-CM

## 2011-03-29 HISTORY — DX: Chronic obstructive pulmonary disease, unspecified: J44.9

## 2011-03-29 HISTORY — DX: Thrombocytopenia, unspecified: D69.6

## 2011-03-29 LAB — CBC
HCT: 43.3 % (ref 39.0–52.0)
Hemoglobin: 15 g/dL (ref 13.0–17.0)
MCV: 95.4 fL (ref 78.0–100.0)
Platelets: 468 10*3/uL — ABNORMAL HIGH (ref 150–400)
RBC: 4.54 MIL/uL (ref 4.22–5.81)
WBC: 7.3 10*3/uL (ref 4.0–10.5)

## 2011-03-29 LAB — BASIC METABOLIC PANEL
BUN: 14 mg/dL (ref 6–23)
CO2: 25 mEq/L (ref 19–32)
Calcium: 8.8 mg/dL (ref 8.4–10.5)
Glucose, Bld: 118 mg/dL — ABNORMAL HIGH (ref 70–99)
Sodium: 139 mEq/L (ref 135–145)

## 2011-03-29 LAB — URINALYSIS, ROUTINE W REFLEX MICROSCOPIC
Ketones, ur: NEGATIVE mg/dL
Leukocytes, UA: NEGATIVE
Nitrite: NEGATIVE
Protein, ur: NEGATIVE mg/dL
pH: 5.5 (ref 5.0–8.0)

## 2011-03-29 LAB — CARDIAC PANEL(CRET KIN+CKTOT+MB+TROPI)
CK, MB: 3.8 ng/mL (ref 0.3–4.0)
Relative Index: 0.4 (ref 0.0–2.5)
Total CK: 982 U/L — ABNORMAL HIGH (ref 7–232)

## 2011-03-29 LAB — DIFFERENTIAL
Eosinophils Relative: 1 % (ref 0–5)
Lymphocytes Relative: 10 % — ABNORMAL LOW (ref 12–46)
Lymphs Abs: 0.7 10*3/uL (ref 0.7–4.0)
Monocytes Relative: 10 % (ref 3–12)

## 2011-03-29 MED ORDER — IOHEXOL 350 MG/ML SOLN
100.0000 mL | Freq: Once | INTRAVENOUS | Status: AC | PRN
  Administered 2011-03-29: 100 mL via INTRAVENOUS

## 2011-03-29 MED ORDER — MORPHINE SULFATE 4 MG/ML IJ SOLN
4.0000 mg | Freq: Once | INTRAMUSCULAR | Status: AC
  Administered 2011-03-29: 4 mg via INTRAVENOUS
  Filled 2011-03-29: qty 1

## 2011-03-29 MED ORDER — KETOROLAC TROMETHAMINE 30 MG/ML IJ SOLN
30.0000 mg | Freq: Once | INTRAMUSCULAR | Status: AC
  Administered 2011-03-29: 30 mg via INTRAVENOUS
  Filled 2011-03-29: qty 1

## 2011-03-29 MED ORDER — SODIUM CHLORIDE 0.9 % IV BOLUS (SEPSIS)
1000.0000 mL | Freq: Once | INTRAVENOUS | Status: AC
  Administered 2011-03-29: 1000 mL via INTRAVENOUS

## 2011-03-29 NOTE — ED Notes (Signed)
CALLED CT TO CHECK ON DELAY. THEY ADVISE THERE ARE 3 PEOPLE AHEAD OF THIS PAITENT.

## 2011-03-29 NOTE — ED Notes (Signed)
Went into patient room to get patient up to ambulate.  Patient stated he was not in the mood to ambulate.  Stated he hurts every time he moves.  Stated he will have to talk with the Doctor.  Will let RN know.
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
## 2011-03-29 NOTE — ED Provider Notes (Signed)
4:35 PM Assumed care of patient in the CDU from [facility].  Patient is a 60 year old male who came to the ED today with a chief complaint of abdominal pain and also pain in his chest.  He was seen by Dr. Manus Gunning and resident [facility].   He was hypoxic in the ED.  His O2 sats were 92 on RA and decreased to 87 with walking.  His CXR was negative.  Normal troponin.  Normal EKG.  Normal d-dimer.  However, his CK is elevated.  Plan is for patient to get a CT angiogram of his chest and CT of abdomen and pelvis and be admitted. Reassessed patient.  Patient denies feeling short of breath or any chest pain at this time.  His O2 sats were 90 on RA.  Patient was placed on 2L [place].    6:30 PM Reassessed patient.  He reports that he had a BM and his abdominal pain is improved.  However, he continues to have pleuritic type chest pain.  He is requesting more pain medications.  He denies feeling short of breath.  He took his Frannie oxygen off and his sats dropped to 98.  He has been instructed to leave the oxygen on.    7:26 PM Reassessed patient.  Patient reports that his pain is improving.  Oxygen sat currently 98 on 2L [place].  9:11 PM Reassessed patient.  He reports that his pain has improved.  He is not in any acute distress at this time.     11:00 PM Reassessed patient.  Patient is not in any acute distress.  He reports that his pain is controlled at this time.  12:00 CT scan resulted.  [facility] called and stated they will admit patient.    Pascal Lux [facility] 04/05/11 2031

## 2011-03-29 NOTE — ED Notes (Signed)
Patient given a urinal and made aware of need for urine sample.

## 2011-03-29 NOTE — ED Notes (Signed)
Patient ambulated to bathroom. States had some success with bowel movement. Still has pain in left lower abdomen when he coughs, sneezes or blows his nose.

## 2011-03-29 NOTE — ED Notes (Signed)
Pt undressed and in gown. Bp cuff and pulse ox on.

## 2011-03-29 NOTE — ED Notes (Signed)
Pt c/o left sided abd pain with nausea x several days; pt sts pain worse with cough or deep breath; pt sts no BM x 3 days

## 2011-03-30 ENCOUNTER — Encounter (HOSPITAL_COMMUNITY): Payer: Self-pay | Admitting: Internal Medicine

## 2011-03-30 DIAGNOSIS — J441 Chronic obstructive pulmonary disease with (acute) exacerbation: Secondary | ICD-10-CM | POA: Diagnosis present

## 2011-03-30 DIAGNOSIS — R05 Cough: Secondary | ICD-10-CM

## 2011-03-30 DIAGNOSIS — R748 Abnormal levels of other serum enzymes: Secondary | ICD-10-CM

## 2011-03-30 DIAGNOSIS — R109 Unspecified abdominal pain: Secondary | ICD-10-CM

## 2011-03-30 DIAGNOSIS — R0902 Hypoxemia: Secondary | ICD-10-CM

## 2011-03-30 DIAGNOSIS — N179 Acute kidney failure, unspecified: Secondary | ICD-10-CM | POA: Diagnosis present

## 2011-03-30 DIAGNOSIS — D696 Thrombocytopenia, unspecified: Secondary | ICD-10-CM

## 2011-03-30 DIAGNOSIS — R059 Cough, unspecified: Secondary | ICD-10-CM

## 2011-03-30 HISTORY — DX: Thrombocytopenia, unspecified: D69.6

## 2011-03-30 LAB — TSH: TSH: 3.187 u[IU]/mL (ref 0.350–4.500)

## 2011-03-30 LAB — CBC
Hemoglobin: 15 g/dL (ref 13.0–17.0)
MCH: 32.6 pg (ref 26.0–34.0)
MCV: 96.3 fL (ref 78.0–100.0)
Platelets: 448 10*3/uL — ABNORMAL HIGH (ref 150–400)
RBC: 4.6 MIL/uL (ref 4.22–5.81)

## 2011-03-30 MED ORDER — HYDROCODONE-ACETAMINOPHEN 5-325 MG PO TABS: 1.0000 | ORAL_TABLET | ORAL | Status: DC | PRN

## 2011-03-30 MED ORDER — ENOXAPARIN SODIUM 40 MG/0.4ML ~~LOC~~ SOLN
40.0000 mg | SUBCUTANEOUS | Status: DC
  Administered 2011-03-30: 40 mg via SUBCUTANEOUS
  Filled 2011-03-30: qty 0.4

## 2011-03-30 MED ORDER — INFLUENZA VIRUS VACC SPLIT PF IM SUSP
0.5000 mL | INTRAMUSCULAR | Status: AC | PRN
  Administered 2011-03-30: 0.5 mL via INTRAMUSCULAR
  Filled 2011-03-30: qty 0.5

## 2011-03-30 MED ORDER — PNEUMOCOCCAL VAC POLYVALENT 25 MCG/0.5ML IJ INJ: 0.5000 mL | INJECTION | INTRAMUSCULAR | Status: DC

## 2011-03-30 MED ORDER — SORBITOL 70 % SOLN: 30.0000 mL | Freq: Every day | Status: DC | PRN

## 2011-03-30 MED ORDER — ALBUTEROL SULFATE (5 MG/ML) 0.5% IN NEBU
2.5000 mg | INHALATION_SOLUTION | Freq: Four times a day (QID) | RESPIRATORY_TRACT | Status: DC
  Administered 2011-03-30 (×3): 2.5 mg via RESPIRATORY_TRACT
  Filled 2011-03-30 (×3): qty 0.5

## 2011-03-30 MED ORDER — FLUTICASONE-SALMETEROL 250-50 MCG/DOSE IN AEPB: 1.0000 | INHALATION_SPRAY | Freq: Two times a day (BID) | RESPIRATORY_TRACT | Status: DC

## 2011-03-30 MED ORDER — PREDNISONE 10 MG PO TABS: 10.0000 mg | ORAL_TABLET | Freq: Every day | ORAL | Status: DC

## 2011-03-30 MED ORDER — ALBUTEROL SULFATE (5 MG/ML) 0.5% IN NEBU: 2.5000 mg | INHALATION_SOLUTION | RESPIRATORY_TRACT | Status: DC | PRN

## 2011-03-30 MED ORDER — FLEET ENEMA 7-19 GM/118ML RE ENEM: 1.0000 | ENEMA | Freq: Once | RECTAL | Status: DC | PRN

## 2011-03-30 MED ORDER — INFLUENZA VIRUS VACC SPLIT PF IM SUSP
0.5000 mL | INTRAMUSCULAR | Status: DC
  Filled 2011-03-30: qty 0.5

## 2011-03-30 MED ORDER — LISINOPRIL 20 MG PO TABS
20.0000 mg | ORAL_TABLET | Freq: Every day | ORAL | Status: DC
  Administered 2011-03-30: 20 mg via ORAL
  Filled 2011-03-30: qty 1

## 2011-03-30 MED ORDER — ONDANSETRON HCL 4 MG/2ML IJ SOLN: 4.0000 mg | Freq: Four times a day (QID) | INTRAMUSCULAR | Status: DC | PRN

## 2011-03-30 MED ORDER — POLYETHYLENE GLYCOL 3350 17 G PO PACK: 17.0000 g | PACK | Freq: Every day | ORAL | Status: AC | PRN

## 2011-03-30 MED ORDER — ACETAMINOPHEN 325 MG PO TABS: 650.0000 mg | ORAL_TABLET | Freq: Four times a day (QID) | ORAL | Status: DC | PRN

## 2011-03-30 MED ORDER — SENNOSIDES-DOCUSATE SODIUM 8.6-50 MG PO TABS: 1.0000 | ORAL_TABLET | Freq: Every day | ORAL | Status: AC

## 2011-03-30 MED ORDER — SODIUM CHLORIDE 0.9 % IV SOLN
INTRAVENOUS | Status: DC
  Administered 2011-03-30 (×2): via INTRAVENOUS

## 2011-03-30 MED ORDER — IPRATROPIUM BROMIDE 0.02 % IN SOLN
0.5000 mg | Freq: Four times a day (QID) | RESPIRATORY_TRACT | Status: DC
  Administered 2011-03-30 (×3): 0.5 mg via RESPIRATORY_TRACT
  Filled 2011-03-30 (×3): qty 2.5

## 2011-03-30 MED ORDER — ALBUTEROL SULFATE (2.5 MG/3ML) 0.083% IN NEBU: 2.5000 mg | INHALATION_SOLUTION | Freq: Four times a day (QID) | RESPIRATORY_TRACT | Status: DC | PRN

## 2011-03-30 MED ORDER — ACETAMINOPHEN 650 MG RE SUPP: 650.0000 mg | Freq: Four times a day (QID) | RECTAL | Status: DC | PRN

## 2011-03-30 MED ORDER — ONDANSETRON HCL 4 MG PO TABS: 4.0000 mg | ORAL_TABLET | Freq: Four times a day (QID) | ORAL | Status: DC | PRN

## 2011-03-30 MED ORDER — PREDNISONE 50 MG PO TABS
50.0000 mg | ORAL_TABLET | Freq: Two times a day (BID) | ORAL | Status: DC
  Administered 2011-03-30 (×2): 50 mg via ORAL
  Filled 2011-03-30 (×3): qty 1

## 2011-03-30 MED ORDER — HYDROCODONE-ACETAMINOPHEN 5-325 MG PO TABS: 1.0000 | ORAL_TABLET | ORAL | Status: AC | PRN

## 2011-03-30 MED ORDER — PNEUMOCOCCAL VAC POLYVALENT 25 MCG/0.5ML IJ INJ
0.5000 mL | INJECTION | INTRAMUSCULAR | Status: AC | PRN
  Administered 2011-03-30: 0.5 mL via INTRAMUSCULAR
  Filled 2011-03-30: qty 0.5

## 2011-03-30 MED ORDER — ASPIRIN 81 MG PO CHEW
81.0000 mg | CHEWABLE_TABLET | Freq: Every day | ORAL | Status: DC
  Administered 2011-03-30: 81 mg via ORAL
  Filled 2011-03-30: qty 1

## 2011-03-30 MED ORDER — POLYETHYLENE GLYCOL 3350 17 G PO PACK: 17.0000 g | PACK | Freq: Every day | ORAL | Status: DC | PRN

## 2011-03-30 MED ORDER — HYDROMORPHONE HCL PF 1 MG/ML IJ SOLN
1.0000 mg | INTRAMUSCULAR | Status: DC | PRN
Start: 2011-03-30 — End: 2011-03-30

## 2011-03-30 NOTE — ED Provider Notes (Signed)
I saw and evaluated the patient, reviewed the resident's note and I agree with the findings and plan.  L lateral rib and LUQ pain x 4 days.sharp shooting pain in his left side, with certain movements, deep breaths, bad coughing spells. Tachycardic, hypoxic.  CXR, EKG, d-dimer   Glynn Octave, MD 03/30/11 [phone]
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
## 2011-03-30 NOTE — Progress Notes (Signed)
Pt discharged to home via wheelchair with spouse. Discharge instructions given and pt verbalized understanding. No further questions at this time.

## 2011-03-30 NOTE — Progress Notes (Signed)
Utilization review completed. James Sawyer 03/30/2011 

## 2011-04-06 NOTE — ED Provider Notes (Signed)
Medical screening examination/treatment/procedure(s) were performed by non-physician practitioner and as supervising physician I was immediately available for consultation/collaboration.  Raeford Razor, MD 04/06/11 [phone]

## 2011-04-17 ENCOUNTER — Telehealth: Payer: Self-pay

## 2011-04-17 NOTE — Telephone Encounter (Signed)
Opened in error

## 2011-04-18 ENCOUNTER — Encounter: Payer: Self-pay | Admitting: Family Medicine

## 2011-04-18 ENCOUNTER — Ambulatory Visit (INDEPENDENT_AMBULATORY_CARE_PROVIDER_SITE_OTHER): Payer: 59 | Admitting: Family Medicine

## 2011-04-18 DIAGNOSIS — R748 Abnormal levels of other serum enzymes: Secondary | ICD-10-CM

## 2011-04-18 DIAGNOSIS — N183 Chronic kidney disease, stage 3 unspecified: Secondary | ICD-10-CM | POA: Insufficient documentation

## 2011-04-18 DIAGNOSIS — N189 Chronic kidney disease, unspecified: Secondary | ICD-10-CM

## 2011-04-18 DIAGNOSIS — I10 Essential (primary) hypertension: Secondary | ICD-10-CM

## 2011-04-18 DIAGNOSIS — N1832 Chronic kidney disease, stage 3b: Secondary | ICD-10-CM | POA: Insufficient documentation

## 2011-04-18 MED ORDER — LISINOPRIL-HYDROCHLOROTHIAZIDE 20-12.5 MG PO TABS: 1.0000 | ORAL_TABLET | Freq: Every day | ORAL | Status: DC
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
## 2011-05-16 ENCOUNTER — Telehealth: Payer: Self-pay | Admitting: *Deleted

## 2011-05-16 ENCOUNTER — Ambulatory Visit: Payer: 59 | Admitting: Family Medicine

## 2011-05-16 DIAGNOSIS — Z0289 Encounter for other administrative examinations: Secondary | ICD-10-CM

## 2011-05-16 NOTE — Telephone Encounter (Signed)
Pt was a no show for 1 month 15 min F/U this morning.  I did leave a message on home VM.

## 2011-05-16 NOTE — Telephone Encounter (Signed)
Would go ahead with late charge.

## 2012-08-25 ENCOUNTER — Encounter: Payer: 59 | Admitting: Family Medicine

## 2012-08-26 ENCOUNTER — Other Ambulatory Visit: Payer: 59

## 2012-08-27 ENCOUNTER — Other Ambulatory Visit: Payer: Self-pay | Admitting: Oncology

## 2012-08-27 ENCOUNTER — Telehealth: Payer: Self-pay

## 2012-08-27 ENCOUNTER — Telehealth: Payer: Self-pay | Admitting: Oncology

## 2012-08-27 DIAGNOSIS — D759 Disease of blood and blood-forming organs, unspecified: Secondary | ICD-10-CM

## 2012-08-27 NOTE — Telephone Encounter (Signed)
Returned wife's call but she was not available. Lm w/relative to let wife know I returned her call and she can call office back. Wife lmonvm requesting appt and didn't say why appt was needed. Pt last seen 03/03/10. Message sent to desk nurse.

## 2012-08-27 NOTE — Telephone Encounter (Signed)
S/w son and he asked [place] to call back about 4 pm.

## 2012-08-27 NOTE — Telephone Encounter (Signed)
S/w pt: he wants to get seen again by Dr Arline Asp. He quit coming because he lost his job and could not afford visits and meds. He is now employed and has insurance. He did not state any symptoms, just that he wants his blood work checked. This information forwarded to Dr Arline Asp.

## 2012-08-28 ENCOUNTER — Telehealth: Payer: Self-pay | Admitting: Oncology

## 2012-08-28 ENCOUNTER — Telehealth: Payer: Self-pay | Admitting: Medical Oncology

## 2012-08-28 ENCOUNTER — Other Ambulatory Visit: Payer: Self-pay | Admitting: Medical Oncology

## 2012-08-28 DIAGNOSIS — D759 Disease of blood and blood-forming organs, unspecified: Secondary | ICD-10-CM

## 2012-08-28 NOTE — Telephone Encounter (Signed)
I called pt and left a message that Dr. Arline Asp is going to have him come in for labs next week. He then will set him up for labs and MD appointment in July. I informed him the schedulers will call him with these appointments. I asked him to call if any questions or problems.

## 2012-08-28 NOTE — Telephone Encounter (Signed)
lmonvm for pt re appts for 5/28 lb (per 5/21 pof) and 7/31 lb/DM (per 5/22 pof). Schedule mailed.

## 2012-09-03 ENCOUNTER — Other Ambulatory Visit: Payer: 59

## 2012-09-08 ENCOUNTER — Other Ambulatory Visit (HOSPITAL_BASED_OUTPATIENT_CLINIC_OR_DEPARTMENT_OTHER): Payer: 59

## 2012-09-08 ENCOUNTER — Encounter: Payer: 59 | Admitting: Family Medicine

## 2012-09-08 ENCOUNTER — Telehealth: Payer: Self-pay | Admitting: Oncology

## 2012-09-08 DIAGNOSIS — D759 Disease of blood and blood-forming organs, unspecified: Secondary | ICD-10-CM

## 2012-09-08 LAB — COMPREHENSIVE METABOLIC PANEL (CC13)
ALT: 34 U/L (ref 0–55)
AST: 25 U/L (ref 5–34)
Albumin: 3.8 g/dL (ref 3.5–5.0)
Calcium: 8.8 mg/dL (ref 8.4–10.4)
Chloride: 110 mEq/L — ABNORMAL HIGH (ref 98–107)
Potassium: 3.9 mEq/L (ref 3.5–5.1)

## 2012-09-08 LAB — CBC WITH DIFFERENTIAL/PLATELET
BASO%: 0.4 % (ref 0.0–2.0)
Basophils Absolute: 0 10*3/uL (ref 0.0–0.1)
EOS%: 1.9 % (ref 0.0–7.0)
HGB: 14.6 g/dL (ref 13.0–17.1)
MCH: 32.2 pg (ref 27.2–33.4)
MCHC: 33.7 g/dL (ref 32.0–36.0)
RDW: 14.4 % (ref 11.0–14.6)
WBC: 6.1 10*3/uL (ref 4.0–10.3)
lymph#: 2.8 10*3/uL (ref 0.9–3.3)

## 2012-09-08 NOTE — Telephone Encounter (Signed)
Pt came by and wants appt changed to Monday 11/10/12 lab and MD

## 2012-09-13 ENCOUNTER — Encounter (HOSPITAL_COMMUNITY): Payer: Self-pay | Admitting: *Deleted

## 2012-09-13 ENCOUNTER — Emergency Department (HOSPITAL_COMMUNITY)
Admission: EM | Admit: 2012-09-13 | Discharge: 2012-09-13 | Disposition: A | Discharge status: Home / Self Care | Payer: 59 | Attending: Emergency Medicine | Admitting: Emergency Medicine

## 2012-09-13 DIAGNOSIS — R3919 Other difficulties with micturition: Secondary | ICD-10-CM | POA: Insufficient documentation

## 2012-09-13 DIAGNOSIS — R300 Dysuria: Secondary | ICD-10-CM | POA: Insufficient documentation

## 2012-09-13 DIAGNOSIS — R350 Frequency of micturition: Secondary | ICD-10-CM | POA: Insufficient documentation

## 2012-09-13 DIAGNOSIS — K4020 Bilateral inguinal hernia, without obstruction or gangrene, not specified as recurrent: Secondary | ICD-10-CM | POA: Insufficient documentation

## 2012-09-13 DIAGNOSIS — N390 Urinary tract infection, site not specified: Secondary | ICD-10-CM | POA: Insufficient documentation

## 2012-09-13 DIAGNOSIS — J449 Chronic obstructive pulmonary disease, unspecified: Secondary | ICD-10-CM | POA: Insufficient documentation

## 2012-09-13 DIAGNOSIS — I10 Essential (primary) hypertension: Secondary | ICD-10-CM | POA: Insufficient documentation

## 2012-09-13 DIAGNOSIS — Z862 Personal history of diseases of the blood and blood-forming organs and certain disorders involving the immune mechanism: Secondary | ICD-10-CM | POA: Insufficient documentation

## 2012-09-13 DIAGNOSIS — J4489 Other specified chronic obstructive pulmonary disease: Secondary | ICD-10-CM | POA: Insufficient documentation

## 2012-09-13 DIAGNOSIS — Z8639 Personal history of other endocrine, nutritional and metabolic disease: Secondary | ICD-10-CM | POA: Insufficient documentation

## 2012-09-13 LAB — URINALYSIS, ROUTINE W REFLEX MICROSCOPIC
Bilirubin Urine: NEGATIVE
Ketones, ur: NEGATIVE mg/dL
Protein, ur: 100 mg/dL — AB
Urobilinogen, UA: 1 mg/dL (ref 0.0–1.0)

## 2012-09-13 LAB — URINE MICROSCOPIC-ADD ON

## 2012-09-13 MED ORDER — CEPHALEXIN 500 MG PO CAPS: 500.0000 mg | ORAL_CAPSULE | Freq: Four times a day (QID) | ORAL | Status: DC

## 2012-09-13 MED ORDER — CEPHALEXIN 500 MG PO CAPS
500.0000 mg | ORAL_CAPSULE | Freq: Once | ORAL | Status: AC
  Administered 2012-09-13: 500 mg via ORAL
  Filled 2012-09-13: qty 1

## 2012-09-13 MED ORDER — CEFTRIAXONE SODIUM 1 G IJ SOLR
1.0000 g | Freq: Once | INTRAMUSCULAR | Status: AC
  Administered 2012-09-13: 1 g via INTRAMUSCULAR
  Filled 2012-09-13: qty 10

## 2012-09-13 NOTE — ED Notes (Signed)
Patient is alert and oriented x3.  He was given DC instructions and follow up visit instructions.  Patient gave verbal understanding.  He was DC ambulatory under his own power to home.  V/S stable.  He was not showing any signs of distress on DC 
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
## 2012-09-13 NOTE — ED Notes (Signed)
Pt states he has grown a third scrotum and now he feels like he is developing a fourth scrotum and he has not been sexually active for two years,  The third scrotum came up about 4 to 5 months ago

## 2012-09-13 NOTE — ED Notes (Signed)
Patient is alert and oriented x3.  He is complaining of burning on urination.  He adds that by the middle of last year he began to notice a growth in his scrotum.  He denies any pain in the scrotum but he does in his belly.

## 2012-09-13 NOTE — ED Notes (Signed)
Pt states it burns to urinate and takes longer than usual,  Pt denies nausea, denies painful urination only burns

## 2012-09-16 LAB — URINE CULTURE: Colony Count: >=100000

## 2012-09-17 ENCOUNTER — Telehealth (HOSPITAL_COMMUNITY): Payer: Self-pay | Admitting: Emergency Medicine

## 2012-09-17 NOTE — Progress Notes (Signed)
  ED Antimicrobial Stewardship Positive Culture Follow Up   James Sawyer is an 62 y.o. male who presented to [facility] on 09/13/2012 with a chief complaint of dysuria, urinary frequency.  Chief Complaint  Patient presents with  . Urinary Retention    Recent Results (from the past 720 hour(s))  URINE CULTURE     Status: None   Collection Time    09/13/12  9:20 PM      Result Value Range Status   Specimen Description URINE, RANDOM   Final   Special Requests NONE   Final   Culture  Setup Time 09/14/2012 17:14   Final   Colony Count >=100,000 COLONIES/ML   Final   Culture ENTEROBACTER AEROGENES   Final   Report Status 09/16/2012 FINAL   Final   Organism ID, Bacteria ENTEROBACTER AEROGENES   Final    [x]  Treated with Cephalexin, organism resistant to prescribed antimicrobial []  Patient discharged originally without antimicrobial agent and treatment is now indicated  New antibiotic prescription: Ciprofloxacin 500mg  PO BID x 7 days  ED Provider: Jaynie Crumble, PA-C   Cleon Dew 09/17/2012, 10:48 AM Infectious Diseases Pharmacist Phone# [phone]

## 2012-09-17 NOTE — ED Notes (Addendum)
Post ED Visit - Positive Culture Follow-up: Successful Patient Follow-Up  Culture assessed and recommendations reviewed by: [x]  Wes Dulaney, Pharm.D., BCPS []  Celedonio Miyamoto, Pharm.D., BCPS []  Georgina Pillion, Pharm.D., BCPS []  [place], [address].D., BCPS, AAHIVP []  Estella Husk, Pharm.D., BCPS, AAHIVP  Positive urine culture  []  Patient discharged without antimicrobial prescription and treatment is now indicated [x]  Organism is resistant to prescribed ED discharge antimicrobial []  Patient with positive blood cultures  Changes discussed with ED provider: Lemont Fillers New antibiotic prescription Cipro 500 mg po BID x 7 days     Larena Sox 09/17/2012, 3:27 PM

## 2012-09-17 NOTE — ED Provider Notes (Signed)
Medical screening examination/treatment/procedure(s) were performed by non-physician practitioner and as supervising physician I was immediately available for consultation/collaboration.    Nayan Proch R Elda Dunkerson, MD 09/17/12 1113 

## 2012-09-19 ENCOUNTER — Telehealth (HOSPITAL_COMMUNITY): Payer: Self-pay | Admitting: *Deleted

## 2012-09-20 ENCOUNTER — Telehealth (HOSPITAL_COMMUNITY): Payer: Self-pay | Admitting: Emergency Medicine

## 2012-09-21 ENCOUNTER — Telehealth (HOSPITAL_COMMUNITY): Payer: Self-pay | Admitting: Emergency Medicine

## 2012-09-21 NOTE — ED Notes (Signed)
Unable to contact patient via phone. Sent letter. °

## 2012-10-18 ENCOUNTER — Telehealth (HOSPITAL_COMMUNITY): Payer: Self-pay | Admitting: Emergency Medicine

## 2012-10-18 NOTE — ED Notes (Signed)
No response to letter sent after 30 days. Chart sent to Medical Records. °

## 2012-11-04 ENCOUNTER — Ambulatory Visit (INDEPENDENT_AMBULATORY_CARE_PROVIDER_SITE_OTHER): Payer: 59 | Admitting: Surgery

## 2012-11-04 ENCOUNTER — Encounter (INDEPENDENT_AMBULATORY_CARE_PROVIDER_SITE_OTHER): Payer: Self-pay | Admitting: Surgery

## 2012-11-04 VITALS — BP 150/92 | HR 74 | Resp 16 | Ht 71.0 in | Wt 256.4 lb

## 2012-11-04 DIAGNOSIS — N5089 Other specified disorders of the male genital organs: Secondary | ICD-10-CM

## 2012-11-04 DIAGNOSIS — N508 Other specified disorders of male genital organs: Secondary | ICD-10-CM
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
## 2012-11-06 ENCOUNTER — Ambulatory Visit: Payer: 59 | Admitting: Oncology

## 2012-11-06 ENCOUNTER — Other Ambulatory Visit: Payer: Self-pay | Admitting: Family Medicine

## 2012-11-06 ENCOUNTER — Other Ambulatory Visit: Payer: 59 | Admitting: Lab

## 2012-11-10 ENCOUNTER — Other Ambulatory Visit: Payer: 59

## 2012-11-10 ENCOUNTER — Other Ambulatory Visit (HOSPITAL_BASED_OUTPATIENT_CLINIC_OR_DEPARTMENT_OTHER): Payer: 59 | Admitting: Lab

## 2012-11-10 ENCOUNTER — Ambulatory Visit (HOSPITAL_BASED_OUTPATIENT_CLINIC_OR_DEPARTMENT_OTHER): Payer: 59 | Admitting: Hematology and Oncology

## 2012-11-10 ENCOUNTER — Telehealth: Payer: Self-pay | Admitting: Oncology

## 2012-11-10 VITALS — BP 139/80 | HR 76 | Temp 98.2°F | Resp 20 | Ht 71.0 in | Wt 259.8 lb

## 2012-11-10 DIAGNOSIS — I10 Essential (primary) hypertension: Secondary | ICD-10-CM

## 2012-11-10 DIAGNOSIS — D759 Disease of blood and blood-forming organs, unspecified: Secondary | ICD-10-CM

## 2012-11-10 DIAGNOSIS — J449 Chronic obstructive pulmonary disease, unspecified: Secondary | ICD-10-CM

## 2012-11-10 DIAGNOSIS — D473 Essential (hemorrhagic) thrombocythemia: Secondary | ICD-10-CM

## 2012-11-10 LAB — CBC WITH DIFFERENTIAL/PLATELET
BASO%: 0.5 % (ref 0.0–2.0)
EOS%: 1.9 % (ref 0.0–7.0)
HCT: 44.2 % (ref 38.4–49.9)
MCH: 32.2 pg (ref 27.2–33.4)
MCHC: 33.6 g/dL (ref 32.0–36.0)
NEUT%: 41.3 % (ref 39.0–75.0)
RBC: 4.62 10*6/uL (ref 4.20–5.82)
WBC: 5.7 10*3/uL (ref 4.0–10.3)
lymph#: 2.9 10*3/uL (ref 0.9–3.3)

## 2012-11-10 LAB — COMPREHENSIVE METABOLIC PANEL (CC13)
ALT: 49 U/L (ref 0–55)
AST: 25 U/L (ref 5–34)
Albumin: 3.9 g/dL (ref 3.5–5.0)
Alkaline Phosphatase: 71 U/L (ref 40–150)
BUN: 13.9 mg/dL (ref 7.0–26.0)
Creatinine: 1.4 mg/dL — ABNORMAL HIGH (ref 0.7–1.3)
Potassium: 3.8 mEq/L (ref 3.5–5.1)

## 2012-11-10 LAB — LACTATE DEHYDROGENASE (CC13): LDH: 227 U/L (ref 125–245)

## 2012-11-10 NOTE — Telephone Encounter (Signed)
, °
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
## 2012-11-11 ENCOUNTER — Ambulatory Visit
Admission: RE | Admit: 2012-11-11 | Discharge: 2012-11-11 | Disposition: A | Discharge status: Home / Self Care | Payer: 59 | Source: Ambulatory Visit | Attending: Surgery | Admitting: Surgery

## 2012-11-11 DIAGNOSIS — N5089 Other specified disorders of the male genital organs: Secondary | ICD-10-CM

## 2012-11-16 NOTE — Progress Notes (Signed)
Quick Note:  Please call the patient and let them know that the ultrasound showed a cyst above his left testicle. Please refer to Urology for "left epididmyal cyst". No need for follow-up with me.  ______

## 2012-11-17 ENCOUNTER — Telehealth (INDEPENDENT_AMBULATORY_CARE_PROVIDER_SITE_OTHER): Payer: Self-pay | Admitting: General Surgery

## 2012-11-17 ENCOUNTER — Other Ambulatory Visit (INDEPENDENT_AMBULATORY_CARE_PROVIDER_SITE_OTHER): Payer: Self-pay | Admitting: Surgery

## 2012-11-17 DIAGNOSIS — N5089 Other specified disorders of the male genital organs: Secondary | ICD-10-CM

## 2012-11-17 NOTE — Telephone Encounter (Signed)
LMOM for patient to call back and ask for [facility]. He needs a apt to see the Urology group and not see Dr Corliss Skains on 8/25 and his apt has been cancel for that day. I just need to talk to patient when he calls back
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
## 2012-11-19 ENCOUNTER — Telehealth (INDEPENDENT_AMBULATORY_CARE_PROVIDER_SITE_OTHER): Payer: Self-pay | Admitting: General Surgery

## 2012-11-19 NOTE — Telephone Encounter (Signed)
Called patient to let him know that he has a apt with Dr Kathrynn Running on 12-23-12 @ 10:00 and he does not need to come back to see Dr Corliss Skains on 8-25. I gave all this information to his wife. The patient was not feeling well.

## 2012-12-01 ENCOUNTER — Encounter (INDEPENDENT_AMBULATORY_CARE_PROVIDER_SITE_OTHER): Payer: 59 | Admitting: Surgery

## 2013-01-19 ENCOUNTER — Other Ambulatory Visit: Payer: Self-pay | Admitting: Family Medicine

## 2013-02-16 ENCOUNTER — Other Ambulatory Visit: Payer: Self-pay

## 2013-02-16 DIAGNOSIS — D759 Disease of blood and blood-forming organs, unspecified: Secondary | ICD-10-CM

## 2013-02-17 ENCOUNTER — Ambulatory Visit (HOSPITAL_BASED_OUTPATIENT_CLINIC_OR_DEPARTMENT_OTHER): Payer: 59 | Admitting: Internal Medicine

## 2013-02-17 ENCOUNTER — Encounter (INDEPENDENT_AMBULATORY_CARE_PROVIDER_SITE_OTHER): Payer: Self-pay

## 2013-02-17 ENCOUNTER — Other Ambulatory Visit (HOSPITAL_BASED_OUTPATIENT_CLINIC_OR_DEPARTMENT_OTHER): Payer: 59

## 2013-02-17 ENCOUNTER — Encounter: Payer: Self-pay | Admitting: Internal Medicine

## 2013-02-17 ENCOUNTER — Telehealth: Payer: Self-pay | Admitting: Internal Medicine

## 2013-02-17 VITALS — BP 157/97 | HR 68 | Temp 98.4°F | Resp 18 | Ht 71.0 in | Wt 251.7 lb

## 2013-02-17 DIAGNOSIS — D759 Disease of blood and blood-forming organs, unspecified: Secondary | ICD-10-CM

## 2013-02-17 DIAGNOSIS — F17200 Nicotine dependence, unspecified, uncomplicated: Secondary | ICD-10-CM

## 2013-02-17 LAB — COMPREHENSIVE METABOLIC PANEL (CC13)
AST: 19 U/L (ref 5–34)
Alkaline Phosphatase: 67 U/L (ref 40–150)
BUN: 10.9 mg/dL (ref 7.0–26.0)
Calcium: 9.3 mg/dL (ref 8.4–10.4)
Creatinine: 1.3 mg/dL (ref 0.7–1.3)
Total Bilirubin: 0.51 mg/dL (ref 0.20–1.20)

## 2013-02-17 LAB — CBC WITH DIFFERENTIAL/PLATELET
Basophils Absolute: 0 10*3/uL (ref 0.0–0.1)
EOS%: 2.8 % (ref 0.0–7.0)
HCT: 44.7 % (ref 38.4–49.9)
HGB: 15 g/dL (ref 13.0–17.1)
MCH: 32 pg (ref 27.2–33.4)
MCV: 95.4 fL (ref 79.3–98.0)
MONO%: 6.7 % (ref 0.0–14.0)
NEUT%: 40.3 % (ref 39.0–75.0)

## 2013-02-17 NOTE — Telephone Encounter (Signed)
Gave pt appt for lab every month , pt want lab appt on Mondays and Tuesdays and see MD on February 2014

## 2013-02-17 NOTE — Patient Instructions (Signed)
Smoking Cessation Quitting smoking is important to your health and has many advantages. However, it is not always easy to quit since nicotine is a very addictive drug. Often times, people try 3 times or more before being able to quit. This document explains the best ways for you to prepare to quit smoking. Quitting takes hard work and a lot of effort, but you can do it. ADVANTAGES OF QUITTING SMOKING  You will live longer, feel better, and live better.  Your body will feel the impact of quitting smoking almost immediately.  Within 20 minutes, blood pressure decreases. Your pulse returns to its normal level.  After 8 hours, carbon monoxide levels in the blood return to normal. Your oxygen level increases.  After 24 hours, the chance of having a heart attack starts to decrease. Your breath, hair, and body stop smelling like smoke.  After 48 hours, damaged nerve endings begin to recover. Your sense of taste and smell improve.  After 72 hours, the body is virtually free of nicotine. Your bronchial tubes relax and breathing becomes easier.  After 2 to 12 weeks, lungs can hold more air. Exercise becomes easier and circulation improves.  The risk of having a heart attack, stroke, cancer, or lung disease is greatly reduced.  After 1 year, the risk of coronary heart disease is cut in half.  After 5 years, the risk of stroke falls to the same as a nonsmoker.  After 10 years, the risk of lung cancer is cut in half and the risk of other cancers decreases significantly.  After 15 years, the risk of coronary heart disease drops, usually to the level of a nonsmoker.  If you are pregnant, quitting smoking will improve your chances of having a healthy baby.  The people you live with, especially any children, will be healthier.  You will have extra money to spend on things other than cigarettes. QUESTIONS TO THINK ABOUT BEFORE ATTEMPTING TO QUIT You may want to talk about your answers with your  caregiver.  Why do you want to quit?  If you tried to quit in the past, what helped and what did not?  What will be the most difficult situations for you after you quit? How will you plan to handle them?  Who can help you through the tough times? Your family? Friends? A caregiver?  What pleasures do you get from smoking? What ways can you still get pleasure if you quit? Here are some questions to ask your caregiver:  How can you help me to be successful at quitting?  What medicine do you think would be best for me and how should I take it?  What should I do if I need more help?  What is smoking withdrawal like? How can I get information on withdrawal? GET READY  Set a quit date.  Change your environment by getting rid of all cigarettes, ashtrays, matches, and lighters in your home, car, or work. Do not let people smoke in your home.  Review your past attempts to quit. Think about what worked and what did not. GET SUPPORT AND ENCOURAGEMENT You have a better chance of being successful if you have help. You can get support in many ways.  Tell your family, friends, and co-workers that you are going to quit and need their support. Ask them not to smoke around you.  Get individual, group, or telephone counseling and support. Programs are available at local hospitals and health centers. Call your local health department for   information about programs in your area.  Spiritual beliefs and practices may help some smokers quit.  Download a "quit meter" on your computer to keep track of quit statistics, such as how long you have gone without smoking, cigarettes not smoked, and money saved.  Get a self-help book about quitting smoking and staying off of tobacco. LEARN NEW SKILLS AND BEHAVIORS  Distract yourself from urges to smoke. Talk to someone, go for a walk, or occupy your time with a task.  Change your normal routine. Take a different route to work. Drink tea instead of coffee.  Eat breakfast in a different place.  Reduce your stress. Take a hot bath, exercise, or read a book.  Plan something enjoyable to do every day. Reward yourself for not smoking.  Explore interactive web-based programs that specialize in helping you quit. GET MEDICINE AND USE IT CORRECTLY Medicines can help you stop smoking and decrease the urge to smoke. Combining medicine with the above behavioral methods and support can greatly increase your chances of successfully quitting smoking.  Nicotine replacement therapy helps deliver nicotine to your body without the negative effects and risks of smoking. Nicotine replacement therapy includes nicotine gum, lozenges, inhalers, nasal sprays, and skin patches. Some may be available over-the-counter and others require a prescription.  Antidepressant medicine helps people abstain from smoking, but how this works is unknown. This medicine is available by prescription.  Nicotinic receptor partial agonist medicine simulates the effect of nicotine in your brain. This medicine is available by prescription. Ask your caregiver for advice about which medicines to use and how to use them based on your health history. Your caregiver will tell you what side effects to look out for if you choose to be on a medicine or therapy. Carefully read the information on the package. Do not use any other product containing nicotine while using a nicotine replacement product.  RELAPSE OR DIFFICULT SITUATIONS Most relapses occur within the first 3 months after quitting. Do not be discouraged if you start smoking again. Remember, most people try several times before finally quitting. You may have symptoms of withdrawal because your body is used to nicotine. You may crave cigarettes, be irritable, feel very hungry, cough often, get headaches, or have difficulty concentrating. The withdrawal symptoms are only temporary. They are strongest when you first quit, but they will go away within  10 14 days. To reduce the chances of relapse, try to:  Avoid drinking alcohol. Drinking lowers your chances of successfully quitting.  Reduce the amount of caffeine you consume. Once you quit smoking, the amount of caffeine in your body increases and can give you symptoms, such as a rapid heartbeat, sweating, and anxiety.  Avoid smokers because they can make you want to smoke.  Do not let weight gain distract you. Many smokers will gain weight when they quit, usually less than 10 pounds. Eat a healthy diet and stay active. You can always lose the weight gained after you quit.  Find ways to improve your mood other than smoking. FOR MORE INFORMATION  www.smokefree.gov  Document Released: 03/20/2001 Document Revised: 09/25/2011 Document Reviewed: 07/05/2011 [employer] Patient Information 2014 [employer].  
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
## 2013-03-04 ENCOUNTER — Other Ambulatory Visit: Payer: Self-pay | Admitting: Family Medicine

## 2013-03-17 ENCOUNTER — Other Ambulatory Visit (HOSPITAL_BASED_OUTPATIENT_CLINIC_OR_DEPARTMENT_OTHER): Payer: 59 | Admitting: Lab

## 2013-03-17 DIAGNOSIS — D759 Disease of blood and blood-forming organs, unspecified: Secondary | ICD-10-CM

## 2013-03-17 LAB — CBC WITH DIFFERENTIAL/PLATELET
Basophils Absolute: 0 10*3/uL (ref 0.0–0.1)
Eosinophils Absolute: 0.1 10*3/uL (ref 0.0–0.5)
HGB: 15.8 g/dL (ref 13.0–17.1)
MCV: 96.5 fL (ref 79.3–98.0)
MONO%: 5.6 % (ref 0.0–14.0)
NEUT#: 3.6 10*3/uL (ref 1.5–6.5)
Platelets: 524 10*3/uL — ABNORMAL HIGH (ref 140–400)
RDW: 14.1 % (ref 11.0–14.6)

## 2013-04-14 ENCOUNTER — Other Ambulatory Visit (HOSPITAL_BASED_OUTPATIENT_CLINIC_OR_DEPARTMENT_OTHER): Payer: 59

## 2013-04-14 DIAGNOSIS — D759 Disease of blood and blood-forming organs, unspecified: Secondary | ICD-10-CM

## 2013-04-14 LAB — CBC WITH DIFFERENTIAL/PLATELET
BASO%: 0.9 % (ref 0.0–2.0)
Basophils Absolute: 0.1 10*3/uL (ref 0.0–0.1)
EOS%: 2.7 % (ref 0.0–7.0)
Eosinophils Absolute: 0.2 10*3/uL (ref 0.0–0.5)
HEMATOCRIT: 44.7 % (ref 38.4–49.9)
HEMOGLOBIN: 14.9 g/dL (ref 13.0–17.1)
LYMPH#: 2.9 10*3/uL (ref 0.9–3.3)
LYMPH%: 42.2 % (ref 14.0–49.0)
MCH: 32.2 pg (ref 27.2–33.4)
MCHC: 33.4 g/dL (ref 32.0–36.0)
MCV: 96.5 fL (ref 79.3–98.0)
MONO#: 0.5 10*3/uL (ref 0.1–0.9)
MONO%: 7.2 % (ref 0.0–14.0)
NEUT#: 3.3 10*3/uL (ref 1.5–6.5)
NEUT%: 47 % (ref 39.0–75.0)
Platelets: 474 10*3/uL — ABNORMAL HIGH (ref 140–400)
RBC: 4.63 10*6/uL (ref 4.20–5.82)
RDW: 14.8 % — AB (ref 11.0–14.6)
WBC: 7 10*3/uL (ref 4.0–10.3)

## 2013-05-18 ENCOUNTER — Ambulatory Visit: Payer: 59

## 2013-05-18 ENCOUNTER — Telehealth: Payer: Self-pay | Admitting: Internal Medicine

## 2013-05-18 ENCOUNTER — Other Ambulatory Visit: Payer: 59

## 2013-05-18 NOTE — Telephone Encounter (Signed)
pt called and r/s lab and MD today to 2/24 nurse notified

## 2013-06-02 ENCOUNTER — Ambulatory Visit (HOSPITAL_BASED_OUTPATIENT_CLINIC_OR_DEPARTMENT_OTHER): Payer: 59 | Admitting: Internal Medicine

## 2013-06-02 ENCOUNTER — Encounter: Payer: Self-pay | Admitting: Internal Medicine

## 2013-06-02 ENCOUNTER — Telehealth: Payer: Self-pay | Admitting: *Deleted

## 2013-06-02 ENCOUNTER — Other Ambulatory Visit (HOSPITAL_BASED_OUTPATIENT_CLINIC_OR_DEPARTMENT_OTHER): Payer: 59

## 2013-06-02 VITALS — BP 176/94 | HR 86 | Temp 98.9°F | Resp 20 | Ht 71.0 in | Wt 257.8 lb

## 2013-06-02 DIAGNOSIS — R2241 Localized swelling, mass and lump, right lower limb: Secondary | ICD-10-CM

## 2013-06-02 DIAGNOSIS — D759 Disease of blood and blood-forming organs, unspecified: Secondary | ICD-10-CM

## 2013-06-02 DIAGNOSIS — F17200 Nicotine dependence, unspecified, uncomplicated: Secondary | ICD-10-CM

## 2013-06-02 DIAGNOSIS — R229 Localized swelling, mass and lump, unspecified: Secondary | ICD-10-CM

## 2013-06-02 DIAGNOSIS — D473 Essential (hemorrhagic) thrombocythemia: Secondary | ICD-10-CM

## 2013-06-02 LAB — CBC WITH DIFFERENTIAL/PLATELET
BASO%: 0.7 % (ref 0.0–2.0)
BASOS ABS: 0.1 10*3/uL (ref 0.0–0.1)
EOS%: 2.5 % (ref 0.0–7.0)
Eosinophils Absolute: 0.2 10*3/uL (ref 0.0–0.5)
HCT: 45.3 % (ref 38.4–49.9)
HGB: 15.2 g/dL (ref 13.0–17.1)
LYMPH%: 50 % — ABNORMAL HIGH (ref 14.0–49.0)
MCH: 32.5 pg (ref 27.2–33.4)
MCHC: 33.6 g/dL (ref 32.0–36.0)
MCV: 96.8 fL (ref 79.3–98.0)
MONO#: 0.5 10*3/uL (ref 0.1–0.9)
MONO%: 6.9 % (ref 0.0–14.0)
NEUT#: 3 10*3/uL (ref 1.5–6.5)
NEUT%: 39.9 % (ref 39.0–75.0)
Platelets: 492 10*3/uL — ABNORMAL HIGH (ref 140–400)
RBC: 4.68 10*6/uL (ref 4.20–5.82)
RDW: 14.1 % (ref 11.0–14.6)
WBC: 7.5 10*3/uL (ref 4.0–10.3)
lymph#: 3.8 10*3/uL — ABNORMAL HIGH (ref 0.9–3.3)

## 2013-06-02 LAB — COMPREHENSIVE METABOLIC PANEL (CC13)
ALBUMIN: 4 g/dL (ref 3.5–5.0)
ALT: 26 U/L (ref 0–55)
ANION GAP: 10 meq/L (ref 3–11)
AST: 23 U/L (ref 5–34)
Alkaline Phosphatase: 69 U/L (ref 40–150)
BUN: 14.1 mg/dL (ref 7.0–26.0)
CALCIUM: 9.2 mg/dL (ref 8.4–10.4)
CHLORIDE: 109 meq/L (ref 98–109)
CO2: 26 meq/L (ref 22–29)
Creatinine: 1.5 mg/dL — ABNORMAL HIGH (ref 0.7–1.3)
Glucose: 109 mg/dl (ref 70–140)
POTASSIUM: 3.6 meq/L (ref 3.5–5.1)
Sodium: 145 mEq/L (ref 136–145)
TOTAL PROTEIN: 7.2 g/dL (ref 6.4–8.3)
Total Bilirubin: 0.28 mg/dL (ref 0.20–1.20)

## 2013-06-02 MED ORDER — LISINOPRIL-HYDROCHLOROTHIAZIDE 20-12.5 MG PO TABS: 1.0000 | ORAL_TABLET | Freq: Every day | ORAL | Status: DC

## 2013-06-02 NOTE — Telephone Encounter (Signed)
James Sawyer appts for monthly lab and ov along w/ FIN appt. pof for the Sawyer never came thru i will staff message Dr. Juliann Mule and make him aware...td
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
## 2013-06-08 ENCOUNTER — Ambulatory Visit: Payer: 59

## 2013-06-30 ENCOUNTER — Other Ambulatory Visit (HOSPITAL_BASED_OUTPATIENT_CLINIC_OR_DEPARTMENT_OTHER): Payer: 59

## 2013-06-30 DIAGNOSIS — D759 Disease of blood and blood-forming organs, unspecified: Secondary | ICD-10-CM

## 2013-06-30 DIAGNOSIS — F17200 Nicotine dependence, unspecified, uncomplicated: Secondary | ICD-10-CM

## 2013-06-30 DIAGNOSIS — D473 Essential (hemorrhagic) thrombocythemia: Secondary | ICD-10-CM

## 2013-06-30 LAB — CBC WITH DIFFERENTIAL/PLATELET
BASO%: 0.1 % (ref 0.0–2.0)
Basophils Absolute: 0 10*3/uL (ref 0.0–0.1)
EOS ABS: 0.1 10*3/uL (ref 0.0–0.5)
EOS%: 1.4 % (ref 0.0–7.0)
HCT: 45.9 % (ref 38.4–49.9)
HGB: 15.3 g/dL (ref 13.0–17.1)
LYMPH#: 3.1 10*3/uL (ref 0.9–3.3)
LYMPH%: 45 % (ref 14.0–49.0)
MCH: 31.9 pg (ref 27.2–33.4)
MCHC: 33.3 g/dL (ref 32.0–36.0)
MCV: 95.8 fL (ref 79.3–98.0)
MONO#: 0.6 10*3/uL (ref 0.1–0.9)
MONO%: 9.2 % (ref 0.0–14.0)
NEUT%: 44.3 % (ref 39.0–75.0)
NEUTROS ABS: 3.1 10*3/uL (ref 1.5–6.5)
Platelets: 471 10*3/uL — ABNORMAL HIGH (ref 140–400)
RBC: 4.79 10*6/uL (ref 4.20–5.82)
RDW: 14.1 % (ref 11.0–14.6)
WBC: 6.9 10*3/uL (ref 4.0–10.3)

## 2013-08-04 ENCOUNTER — Other Ambulatory Visit (HOSPITAL_BASED_OUTPATIENT_CLINIC_OR_DEPARTMENT_OTHER): Payer: 59

## 2013-08-04 DIAGNOSIS — F17200 Nicotine dependence, unspecified, uncomplicated: Secondary | ICD-10-CM

## 2013-08-04 DIAGNOSIS — D473 Essential (hemorrhagic) thrombocythemia: Secondary | ICD-10-CM

## 2013-08-04 DIAGNOSIS — D759 Disease of blood and blood-forming organs, unspecified: Secondary | ICD-10-CM

## 2013-08-04 LAB — CBC WITH DIFFERENTIAL/PLATELET
BASO%: 0.2 % (ref 0.0–2.0)
Basophils Absolute: 0 10*3/uL (ref 0.0–0.1)
EOS ABS: 0.1 10*3/uL (ref 0.0–0.5)
EOS%: 2 % (ref 0.0–7.0)
HCT: 44.9 % (ref 38.4–49.9)
HEMOGLOBIN: 15.1 g/dL (ref 13.0–17.1)
LYMPH%: 46.2 % (ref 14.0–49.0)
MCH: 31.7 pg (ref 27.2–33.4)
MCHC: 33.6 g/dL (ref 32.0–36.0)
MCV: 94.3 fL (ref 79.3–98.0)
MONO#: 0.5 10*3/uL (ref 0.1–0.9)
MONO%: 7.5 % (ref 0.0–14.0)
NEUT%: 44.1 % (ref 39.0–75.0)
NEUTROS ABS: 2.8 10*3/uL (ref 1.5–6.5)
PLATELETS: 508 10*3/uL — AB (ref 140–400)
RBC: 4.76 10*6/uL (ref 4.20–5.82)
RDW: 14 % (ref 11.0–14.6)
WBC: 6.4 10*3/uL (ref 4.0–10.3)
lymph#: 3 10*3/uL (ref 0.9–3.3)

## 2013-09-01 ENCOUNTER — Telehealth: Payer: Self-pay | Admitting: Internal Medicine

## 2013-09-01 ENCOUNTER — Other Ambulatory Visit (HOSPITAL_BASED_OUTPATIENT_CLINIC_OR_DEPARTMENT_OTHER): Payer: 59

## 2013-09-01 ENCOUNTER — Ambulatory Visit (HOSPITAL_BASED_OUTPATIENT_CLINIC_OR_DEPARTMENT_OTHER): Payer: 59 | Admitting: Internal Medicine

## 2013-09-01 VITALS — BP 153/103 | HR 69 | Temp 98.8°F | Resp 18 | Ht 71.0 in | Wt 254.1 lb

## 2013-09-01 DIAGNOSIS — D759 Disease of blood and blood-forming organs, unspecified: Secondary | ICD-10-CM

## 2013-09-01 DIAGNOSIS — F17200 Nicotine dependence, unspecified, uncomplicated: Secondary | ICD-10-CM

## 2013-09-01 DIAGNOSIS — I129 Hypertensive chronic kidney disease with stage 1 through stage 4 chronic kidney disease, or unspecified chronic kidney disease: Secondary | ICD-10-CM

## 2013-09-01 DIAGNOSIS — N179 Acute kidney failure, unspecified: Secondary | ICD-10-CM

## 2013-09-01 DIAGNOSIS — I10 Essential (primary) hypertension: Secondary | ICD-10-CM

## 2013-09-01 DIAGNOSIS — N189 Chronic kidney disease, unspecified: Secondary | ICD-10-CM

## 2013-09-01 DIAGNOSIS — D492 Neoplasm of unspecified behavior of bone, soft tissue, and skin: Secondary | ICD-10-CM

## 2013-09-01 DIAGNOSIS — D473 Essential (hemorrhagic) thrombocythemia: Secondary | ICD-10-CM

## 2013-09-01 DIAGNOSIS — D4989 Neoplasm of unspecified behavior of other specified sites: Secondary | ICD-10-CM

## 2013-09-01 DIAGNOSIS — H9209 Otalgia, unspecified ear: Secondary | ICD-10-CM

## 2013-09-01 LAB — CBC WITH DIFFERENTIAL/PLATELET
BASO%: 0.3 % (ref 0.0–2.0)
BASOS ABS: 0 10*3/uL (ref 0.0–0.1)
EOS ABS: 0.1 10*3/uL (ref 0.0–0.5)
EOS%: 1.4 % (ref 0.0–7.0)
HEMATOCRIT: 44.8 % (ref 38.4–49.9)
HGB: 15.2 g/dL (ref 13.0–17.1)
LYMPH%: 39.6 % (ref 14.0–49.0)
MCH: 32.2 pg (ref 27.2–33.4)
MCHC: 33.9 g/dL (ref 32.0–36.0)
MCV: 94.9 fL (ref 79.3–98.0)
MONO#: 0.4 10*3/uL (ref 0.1–0.9)
MONO%: 6.1 % (ref 0.0–14.0)
NEUT%: 52.6 % (ref 39.0–75.0)
NEUTROS ABS: 3.3 10*3/uL (ref 1.5–6.5)
PLATELETS: 483 10*3/uL — AB (ref 140–400)
RBC: 4.72 10*6/uL (ref 4.20–5.82)
RDW: 14.3 % (ref 11.0–14.6)
WBC: 6.3 10*3/uL (ref 4.0–10.3)
lymph#: 2.5 10*3/uL (ref 0.9–3.3)

## 2013-09-01 LAB — BASIC METABOLIC PANEL (CC13)
Anion Gap: 9 mEq/L (ref 3–11)
BUN: 16.2 mg/dL (ref 7.0–26.0)
CALCIUM: 9.1 mg/dL (ref 8.4–10.4)
CO2: 25 meq/L (ref 22–29)
CREATININE: 1.4 mg/dL — AB (ref 0.7–1.3)
Chloride: 109 mEq/L (ref 98–109)
GLUCOSE: 111 mg/dL (ref 70–140)
Potassium: 4 mEq/L (ref 3.5–5.1)
Sodium: 144 mEq/L (ref 136–145)

## 2013-09-01 MED ORDER — LISINOPRIL-HYDROCHLOROTHIAZIDE 20-12.5 MG PO TABS: 1.0000 | ORAL_TABLET | Freq: Every day | ORAL | Status: DC
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
## 2013-09-01 NOTE — Telephone Encounter (Signed)
gv adn printed appt sched and avs for pt for aug...lv for [facility]

## 2013-11-30 ENCOUNTER — Telehealth: Payer: Self-pay | Admitting: Internal Medicine

## 2013-11-30 ENCOUNTER — Ambulatory Visit (HOSPITAL_BASED_OUTPATIENT_CLINIC_OR_DEPARTMENT_OTHER): Payer: 59 | Admitting: Internal Medicine

## 2013-11-30 ENCOUNTER — Encounter: Payer: Self-pay | Admitting: Internal Medicine

## 2013-11-30 ENCOUNTER — Other Ambulatory Visit (HOSPITAL_BASED_OUTPATIENT_CLINIC_OR_DEPARTMENT_OTHER): Payer: 59

## 2013-11-30 VITALS — BP 157/97 | HR 64 | Temp 98.5°F | Resp 18 | Ht 71.0 in | Wt 255.5 lb

## 2013-11-30 DIAGNOSIS — F17200 Nicotine dependence, unspecified, uncomplicated: Secondary | ICD-10-CM

## 2013-11-30 DIAGNOSIS — I10 Essential (primary) hypertension: Secondary | ICD-10-CM

## 2013-11-30 DIAGNOSIS — D759 Disease of blood and blood-forming organs, unspecified: Secondary | ICD-10-CM

## 2013-11-30 DIAGNOSIS — D473 Essential (hemorrhagic) thrombocythemia: Secondary | ICD-10-CM

## 2013-11-30 LAB — CBC WITH DIFFERENTIAL/PLATELET
BASO%: 0.4 % (ref 0.0–2.0)
BASOS ABS: 0 10*3/uL (ref 0.0–0.1)
EOS%: 2.4 % (ref 0.0–7.0)
Eosinophils Absolute: 0.2 10*3/uL (ref 0.0–0.5)
HCT: 42.4 % (ref 38.4–49.9)
HEMOGLOBIN: 14 g/dL (ref 13.0–17.1)
LYMPH%: 51.7 % — ABNORMAL HIGH (ref 14.0–49.0)
MCH: 31.8 pg (ref 27.2–33.4)
MCHC: 33.1 g/dL (ref 32.0–36.0)
MCV: 96.1 fL (ref 79.3–98.0)
MONO#: 0.4 10*3/uL (ref 0.1–0.9)
MONO%: 6.1 % (ref 0.0–14.0)
NEUT#: 2.6 10*3/uL (ref 1.5–6.5)
NEUT%: 39.4 % (ref 39.0–75.0)
Platelets: 506 10*3/uL — ABNORMAL HIGH (ref 140–400)
RBC: 4.41 10*6/uL (ref 4.20–5.82)
RDW: 14.2 % (ref 11.0–14.6)
WBC: 6.6 10*3/uL (ref 4.0–10.3)
lymph#: 3.4 10*3/uL — ABNORMAL HIGH (ref 0.9–3.3)

## 2013-11-30 LAB — BASIC METABOLIC PANEL (CC13)
ANION GAP: 9 meq/L (ref 3–11)
BUN: 11.6 mg/dL (ref 7.0–26.0)
CALCIUM: 8.8 mg/dL (ref 8.4–10.4)
CO2: 25 mEq/L (ref 22–29)
CREATININE: 1.3 mg/dL (ref 0.7–1.3)
Chloride: 109 mEq/L (ref 98–109)
Glucose: 96 mg/dl (ref 70–140)
POTASSIUM: 3.5 meq/L (ref 3.5–5.1)
Sodium: 143 mEq/L (ref 136–145)

## 2013-11-30 LAB — LACTATE DEHYDROGENASE (CC13): LDH: 197 U/L (ref 125–245)
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
## 2013-11-30 NOTE — Telephone Encounter (Signed)
gv adn printed appt sched and avs for pt fro Feb 2016

## 2014-03-02 ENCOUNTER — Telehealth: Payer: Self-pay | Admitting: *Deleted

## 2014-03-02 NOTE — Telephone Encounter (Signed)
Walk in form received reads here to get a prescription refill.  Does not know the name of the drug.  Printed medication list.  Reports it was a medication for his platelets.  No platelet medication (Hydrea) being used per August office note and medication list.  Reports he was given a prescription last June.  Prescription for lisinopril given 09-01-2013 with two additional refills noted in EMR.  Was advised to obtain a PCP for HTN for this medicine to be filled by a Primary Care Provider.   Denies having a PCP at this time and has been out of the medication a few months now.  Denies headaches, dizziness but does say he has visual changes.  This nurse suggested he contact a provider of his choice, [facility] or [facility].

## 2014-04-25 ENCOUNTER — Encounter (HOSPITAL_COMMUNITY): Payer: Self-pay | Admitting: Emergency Medicine

## 2014-04-25 ENCOUNTER — Emergency Department (HOSPITAL_COMMUNITY)
Admission: EM | Admit: 2014-04-25 | Discharge: 2014-04-25 | Disposition: A | Discharge status: Home / Self Care | Payer: 59 | Attending: Emergency Medicine | Admitting: Emergency Medicine

## 2014-04-25 ENCOUNTER — Emergency Department (HOSPITAL_COMMUNITY): Payer: 59

## 2014-04-25 DIAGNOSIS — Z79899 Other long term (current) drug therapy: Secondary | ICD-10-CM | POA: Diagnosis not present

## 2014-04-25 DIAGNOSIS — N200 Calculus of kidney: Secondary | ICD-10-CM | POA: Diagnosis not present

## 2014-04-25 DIAGNOSIS — Z7982 Long term (current) use of aspirin: Secondary | ICD-10-CM | POA: Diagnosis not present

## 2014-04-25 DIAGNOSIS — E785 Hyperlipidemia, unspecified: Secondary | ICD-10-CM | POA: Insufficient documentation

## 2014-04-25 DIAGNOSIS — Z862 Personal history of diseases of the blood and blood-forming organs and certain disorders involving the immune mechanism: Secondary | ICD-10-CM | POA: Insufficient documentation

## 2014-04-25 DIAGNOSIS — R1031 Right lower quadrant pain: Secondary | ICD-10-CM

## 2014-04-25 DIAGNOSIS — Z720 Tobacco use: Secondary | ICD-10-CM | POA: Insufficient documentation

## 2014-04-25 DIAGNOSIS — J449 Chronic obstructive pulmonary disease, unspecified: Secondary | ICD-10-CM | POA: Insufficient documentation

## 2014-04-25 DIAGNOSIS — E669 Obesity, unspecified: Secondary | ICD-10-CM | POA: Diagnosis not present

## 2014-04-25 DIAGNOSIS — N390 Urinary tract infection, site not specified: Secondary | ICD-10-CM | POA: Diagnosis not present

## 2014-04-25 DIAGNOSIS — I10 Essential (primary) hypertension: Secondary | ICD-10-CM | POA: Insufficient documentation

## 2014-04-25 LAB — COMPREHENSIVE METABOLIC PANEL
ALT: 37 U/L (ref 0–53)
ANION GAP: 12 (ref 5–15)
AST: 30 U/L (ref 0–37)
Albumin: 4.7 g/dL (ref 3.5–5.2)
Alkaline Phosphatase: 73 U/L (ref 39–117)
BILIRUBIN TOTAL: 0.6 mg/dL (ref 0.3–1.2)
BUN: 10 mg/dL (ref 6–23)
CALCIUM: 9 mg/dL (ref 8.4–10.5)
CHLORIDE: 99 meq/L (ref 96–112)
CO2: 29 mmol/L (ref 19–32)
Creatinine, Ser: 1.35 mg/dL (ref 0.50–1.35)
GFR calc Af Amer: 63 mL/min — ABNORMAL LOW (ref >90)
GFR calc non Af Amer: 54 mL/min — ABNORMAL LOW (ref >90)
GLUCOSE: 130 mg/dL — AB (ref 70–99)
Potassium: 3.7 mmol/L (ref 3.5–5.1)
Sodium: 140 mmol/L (ref 135–145)
Total Protein: 8.2 g/dL (ref 6.0–8.3)

## 2014-04-25 LAB — CBC WITH DIFFERENTIAL/PLATELET
BASOS ABS: 0 10*3/uL (ref 0.0–0.1)
BASOS PCT: 0 % (ref 0–1)
Eosinophils Absolute: 0 10*3/uL (ref 0.0–0.7)
Eosinophils Relative: 0 % (ref 0–5)
HEMATOCRIT: 47.7 % (ref 39.0–52.0)
HEMOGLOBIN: 16.1 g/dL (ref 13.0–17.0)
Lymphocytes Relative: 15 % (ref 12–46)
Lymphs Abs: 1.1 10*3/uL (ref 0.7–4.0)
MCH: 32.5 pg (ref 26.0–34.0)
MCHC: 33.8 g/dL (ref 30.0–36.0)
MCV: 96.2 fL (ref 78.0–100.0)
Monocytes Absolute: 0.2 10*3/uL (ref 0.1–1.0)
Monocytes Relative: 3 % (ref 3–12)
NEUTROS PCT: 82 % — AB (ref 43–77)
Neutro Abs: 5.9 10*3/uL (ref 1.7–7.7)
PLATELETS: 533 10*3/uL — AB (ref 150–400)
RBC: 4.96 MIL/uL (ref 4.22–5.81)
RDW: 13.9 % (ref 11.5–15.5)
WBC: 7.2 10*3/uL (ref 4.0–10.5)

## 2014-04-25 LAB — URINALYSIS, ROUTINE W REFLEX MICROSCOPIC
Bilirubin Urine: NEGATIVE
Glucose, UA: NEGATIVE mg/dL
HGB URINE DIPSTICK: NEGATIVE
Ketones, ur: NEGATIVE mg/dL
Leukocytes, UA: NEGATIVE
NITRITE: NEGATIVE
Protein, ur: 30 mg/dL — AB
Specific Gravity, Urine: 1.017 (ref 1.005–1.030)
Urobilinogen, UA: 0.2 mg/dL (ref 0.0–1.0)
pH: 8 (ref 5.0–8.0)

## 2014-04-25 LAB — I-STAT CHEM 8, ED
BUN: 9 mg/dL (ref 6–23)
CHLORIDE: 101 meq/L (ref 96–112)
Calcium, Ion: 1.13 mmol/L (ref 1.13–1.30)
Creatinine, Ser: 1.3 mg/dL (ref 0.50–1.35)
Glucose, Bld: 133 mg/dL — ABNORMAL HIGH (ref 70–99)
HCT: 54 % — ABNORMAL HIGH (ref 39.0–52.0)
Hemoglobin: 18.4 g/dL — ABNORMAL HIGH (ref 13.0–17.0)
Potassium: 3.7 mmol/L (ref 3.5–5.1)
Sodium: 143 mmol/L (ref 135–145)
TCO2: 25 mmol/L (ref 0–100)

## 2014-04-25 LAB — URINE MICROSCOPIC-ADD ON

## 2014-04-25 LAB — LIPASE, BLOOD: Lipase: 21 U/L (ref 11–59)

## 2014-04-25 MED ORDER — IOHEXOL 300 MG/ML  SOLN
50.0000 mL | Freq: Once | INTRAMUSCULAR | Status: AC | PRN
  Administered 2014-04-25: 50 mL via ORAL

## 2014-04-25 MED ORDER — LISINOPRIL-HYDROCHLOROTHIAZIDE 20-12.5 MG PO TABS: 1.0000 | ORAL_TABLET | Freq: Every day | ORAL | Status: DC

## 2014-04-25 MED ORDER — HYDROCHLOROTHIAZIDE 12.5 MG PO CAPS
25.0000 mg | ORAL_CAPSULE | Freq: Once | ORAL | Status: AC
Start: 2014-04-25 — End: 2014-04-25
  Administered 2014-04-25: 25 mg via ORAL
  Filled 2014-04-25: qty 2

## 2014-04-25 MED ORDER — HYDROCODONE-ACETAMINOPHEN 5-325 MG PO TABS: 1.0000 | ORAL_TABLET | Freq: Two times a day (BID) | ORAL | Status: DC | PRN

## 2014-04-25 MED ORDER — LEVOFLOXACIN 750 MG PO TABS: 750.0000 mg | ORAL_TABLET | Freq: Every day | ORAL | Status: DC

## 2014-04-25 MED ORDER — IOHEXOL 300 MG/ML  SOLN
100.0000 mL | Freq: Once | INTRAMUSCULAR | Status: AC | PRN
  Administered 2014-04-25: 100 mL via INTRAVENOUS

## 2014-04-25 MED ORDER — LISINOPRIL 20 MG PO TABS
20.0000 mg | ORAL_TABLET | Freq: Once | ORAL | Status: AC
  Administered 2014-04-25: 20 mg via ORAL
  Filled 2014-04-25 (×2): qty 1

## 2014-04-25 MED ORDER — MORPHINE SULFATE 4 MG/ML IJ SOLN
6.0000 mg | Freq: Once | INTRAMUSCULAR | Status: AC
  Administered 2014-04-25: 6 mg via INTRAVENOUS
  Filled 2014-04-25: qty 2

## 2014-04-25 MED ORDER — LEVOFLOXACIN 750 MG PO TABS
750.0000 mg | ORAL_TABLET | Freq: Once | ORAL | Status: AC
  Administered 2014-04-25: 750 mg via ORAL
  Filled 2014-04-25: qty 1

## 2014-04-25 MED ORDER — SODIUM CHLORIDE 0.9 % IV BOLUS (SEPSIS)
1000.0000 mL | Freq: Once | INTRAVENOUS | Status: AC
  Administered 2014-04-25: 1000 mL via INTRAVENOUS
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
## 2014-04-25 NOTE — Discharge Instructions (Signed)
Kidney Stones [name], your CT scan results are below. Your pain was likely from kidney stones. You also have a urine infection, take antibiotics as prescribed. Follow-up with urology within 3 days for continued management. Your blood pressure was very high, your medications refilled, please take this every day and follow-up with your primary care physician for repeat blood pressure check within 3 days. If symptoms worsen come back to emergency department immediately. Thank you.  IMPRESSION: 1. Minimal nonspecific gallbladder wall thickening, without significant pericholecystic fluid. Cholelithiasis noted. Very mild cholecystitis cannot be entirely excluded; would correlate for associated symptoms. 2. Diffuse fatty infiltration within the liver. 3. Nonobstructing right renal stones measure up to 5 mm in size. Small right renal cyst seen. 4. Mild right basilar atelectasis noted. Kidney stones (urolithiasis) are solid masses that form inside your kidneys. The intense pain is caused by the stone moving through the kidney, ureter, bladder, and urethra (urinary tract). When the stone moves, the ureter starts to spasm around the stone. The stone is usually passed in your pee (urine).  HOME CARE  Drink enough fluids to keep your pee clear or pale yellow. This helps to get the stone out.  Strain all pee through the provided strainer. Do not pee without peeing through the strainer, not even once. If you pee the stone out, catch it in the strainer. The stone may be as small as a grain of salt. Take this to your doctor. This will help your doctor figure out what you can do to try to prevent more kidney stones.  Only take medicine as told by your doctor.  Follow up with your doctor as told.  Get follow-up X-rays as told by your doctor. GET HELP IF: You have pain that gets worse even if you have been taking pain medicine. GET HELP RIGHT AWAY IF:   Your pain does not get better with medicine.  You  have a fever or shaking chills.  Your pain increases and gets worse over 18 hours.  You have new belly (abdominal) pain.  You feel faint or pass out.  You are unable to pee. MAKE SURE YOU:   Understand these instructions.  Will watch your condition.  Will get help right away if you are not doing well or get worse. Document Released: 09/12/2007 Document Revised: 11/26/2012 Document Reviewed: 08/27/2012 [facility] James Sawyer Information 2015 [place], [place]. This information is not intended to replace advice given to you by your health care provider. Make sure you discuss any questions you have with your health care provider. Urinary Tract Infection A urinary tract infection (UTI) can occur any place along the urinary tract. The tract includes the kidneys, ureters, bladder, and urethra. A type of germ called bacteria often causes a UTI. UTIs are often helped with antibiotic medicine.  HOME CARE   If given, take antibiotics as told by your doctor. Finish them even if you start to feel better.  Drink enough fluids to keep your pee (urine) clear or pale yellow.  Avoid tea, drinks with caffeine, and bubbly (carbonated) drinks.  Pee often. Avoid holding your pee in for a long time.  Pee before and after having sex (intercourse).  Wipe from front to back after you poop (bowel movement) if you are a woman. Use each tissue only once. GET HELP RIGHT AWAY IF:   You have back pain.  You have lower belly (abdominal) pain.  You have chills.  You feel sick to your stomach (nauseous).  You throw up (  vomit).  Your burning or discomfort with peeing does not go away.  You have a fever.  Your symptoms are not better in 3 days. MAKE SURE YOU:   Understand these instructions.  Will watch your condition.  Will get help right away if you are not doing well or get worse. Document Released: 09/12/2007 Document Revised: 12/19/2011 Document Reviewed: 10/25/2011 [facility] James Sawyer Information  2015 [place], [place]. This information is not intended to replace advice given to you by your health care provider. Make sure you discuss any questions you have with your health care provider. Hypertension Hypertension is another name for high blood pressure. High blood pressure forces your heart to work harder to pump blood. A blood pressure reading has two numbers, which includes a higher number over a lower number (example: 110/72). HOME CARE   Have your blood pressure rechecked by your doctor.  Only take medicine as told by your doctor. Follow the directions carefully. The medicine does not work as well if you skip doses. Skipping doses also puts you at risk for problems.  Do not smoke.  Monitor your blood pressure at home as told by your doctor. GET HELP IF:  You think you are having a reaction to the medicine you are taking.  You have repeat headaches or feel dizzy.  You have puffiness (swelling) in your ankles.  You have trouble with your vision. GET HELP RIGHT AWAY IF:   You get a very bad headache and are confused.  You feel weak, numb, or faint.  You get chest or belly (abdominal) pain.  You throw up (vomit).  You cannot breathe very well. MAKE SURE YOU:   Understand these instructions.  Will watch your condition.  Will get help right away if you are not doing well or get worse. Document Released: 09/12/2007 Document Revised: 03/31/2013 Document Reviewed: 01/16/2013 [facility] James Sawyer Information 2015 [place], [place]. This information is not intended to replace advice given to you by your health care provider. Make sure you discuss any questions you have with your health care provider.

## 2014-04-25 NOTE — ED Notes (Signed)
Waiting till after CT scan to give BP meds

## 2014-04-25 NOTE — ED Notes (Signed)
Gone to CT scan.

## 2014-04-25 NOTE — ED Notes (Signed)
Pt has N/V x last 6 hours.  Denies Diarrhea.

## 2014-04-25 NOTE — ED Notes (Signed)
Pt was discharged home at 06:57 with wife driving him home.

## 2014-04-25 NOTE — ED Notes (Signed)
Pt c/o low abd pain w/ NV since 2100 tonight.

## 2014-05-04 ENCOUNTER — Telehealth: Payer: Self-pay | Admitting: Oncology

## 2014-05-04 NOTE — Telephone Encounter (Signed)
, °

## 2014-05-31 ENCOUNTER — Other Ambulatory Visit: Payer: 59

## 2014-05-31 ENCOUNTER — Ambulatory Visit: Payer: 59

## 2014-08-11 ENCOUNTER — Telehealth: Payer: Self-pay | Admitting: Internal Medicine

## 2014-08-11 NOTE — Telephone Encounter (Signed)
yes

## 2014-08-11 NOTE — Telephone Encounter (Signed)
Patient use to see Dr. Elease Hashimoto.  Patient is requesting to establish care with Dr. Ronnald Ramp.  Please advise.

## 2014-08-11 NOTE — Telephone Encounter (Signed)
Got scheduled  °

## 2014-08-26 ENCOUNTER — Ambulatory Visit (INDEPENDENT_AMBULATORY_CARE_PROVIDER_SITE_OTHER)
Admission: RE | Admit: 2014-08-26 | Discharge: 2014-08-26 | Disposition: A | Discharge status: Home / Self Care | Payer: 59 | Source: Ambulatory Visit | Attending: Internal Medicine | Admitting: Internal Medicine

## 2014-08-26 ENCOUNTER — Ambulatory Visit (INDEPENDENT_AMBULATORY_CARE_PROVIDER_SITE_OTHER): Payer: 59 | Admitting: Internal Medicine

## 2014-08-26 ENCOUNTER — Other Ambulatory Visit (INDEPENDENT_AMBULATORY_CARE_PROVIDER_SITE_OTHER): Payer: 59

## 2014-08-26 ENCOUNTER — Encounter: Payer: Self-pay | Admitting: Internal Medicine

## 2014-08-26 VITALS — BP 148/90 | HR 88 | Temp 98.6°F | Resp 16 | Ht 71.0 in | Wt 273.0 lb

## 2014-08-26 DIAGNOSIS — D759 Disease of blood and blood-forming organs, unspecified: Secondary | ICD-10-CM | POA: Diagnosis not present

## 2014-08-26 DIAGNOSIS — R05 Cough: Secondary | ICD-10-CM

## 2014-08-26 DIAGNOSIS — Z0000 Encounter for general adult medical examination without abnormal findings: Secondary | ICD-10-CM | POA: Diagnosis not present

## 2014-08-26 DIAGNOSIS — R7989 Other specified abnormal findings of blood chemistry: Secondary | ICD-10-CM | POA: Diagnosis not present

## 2014-08-26 DIAGNOSIS — R0609 Other forms of dyspnea: Secondary | ICD-10-CM | POA: Insufficient documentation

## 2014-08-26 DIAGNOSIS — N181 Chronic kidney disease, stage 1: Secondary | ICD-10-CM

## 2014-08-26 DIAGNOSIS — R059 Cough, unspecified: Secondary | ICD-10-CM

## 2014-08-26 DIAGNOSIS — I10 Essential (primary) hypertension: Secondary | ICD-10-CM | POA: Diagnosis not present

## 2014-08-26 DIAGNOSIS — I119 Hypertensive heart disease without heart failure: Secondary | ICD-10-CM | POA: Diagnosis not present

## 2014-08-26 DIAGNOSIS — R2241 Localized swelling, mass and lump, right lower limb: Secondary | ICD-10-CM

## 2014-08-26 DIAGNOSIS — R0683 Snoring: Secondary | ICD-10-CM

## 2014-08-26 DIAGNOSIS — Z1211 Encounter for screening for malignant neoplasm of colon: Secondary | ICD-10-CM

## 2014-08-26 DIAGNOSIS — R0602 Shortness of breath: Secondary | ICD-10-CM

## 2014-08-26 LAB — COMPREHENSIVE METABOLIC PANEL
ALT: 34 U/L (ref 0–53)
AST: 22 U/L (ref 0–37)
Albumin: 4.2 g/dL (ref 3.5–5.2)
Alkaline Phosphatase: 73 U/L (ref 39–117)
BILIRUBIN TOTAL: 0.3 mg/dL (ref 0.2–1.2)
BUN: 12 mg/dL (ref 6–23)
CALCIUM: 9.6 mg/dL (ref 8.4–10.5)
CO2: 31 mEq/L (ref 19–32)
Chloride: 109 mEq/L (ref 96–112)
Creatinine, Ser: 1.39 mg/dL (ref 0.40–1.50)
GFR: 66.21 mL/min (ref >60.00)
GLUCOSE: 87 mg/dL (ref 70–99)
Potassium: 3.6 mEq/L (ref 3.5–5.1)
Sodium: 145 mEq/L (ref 135–145)
Total Protein: 6.9 g/dL (ref 6.0–8.3)

## 2014-08-26 LAB — PSA: PSA: 0.45 ng/mL (ref 0.10–4.00)

## 2014-08-26 LAB — CBC WITH DIFFERENTIAL/PLATELET
BASOS PCT: 0.4 % (ref 0.0–3.0)
Basophils Absolute: 0 10*3/uL (ref 0.0–0.1)
EOS ABS: 0.1 10*3/uL (ref 0.0–0.7)
Eosinophils Relative: 2.1 % (ref 0.0–5.0)
HEMATOCRIT: 43.8 % (ref 39.0–52.0)
HEMOGLOBIN: 14.9 g/dL (ref 13.0–17.0)
LYMPHS ABS: 2.4 10*3/uL (ref 0.7–4.0)
LYMPHS PCT: 39.5 % (ref 12.0–46.0)
MCHC: 33.9 g/dL (ref 30.0–36.0)
MCV: 94.1 fl (ref 78.0–100.0)
Monocytes Absolute: 0.5 10*3/uL (ref 0.1–1.0)
Monocytes Relative: 8.2 % (ref 3.0–12.0)
NEUTROS ABS: 3 10*3/uL (ref 1.4–7.7)
Neutrophils Relative %: 49.8 % (ref 43.0–77.0)
Platelets: 442 10*3/uL — ABNORMAL HIGH (ref 150.0–400.0)
RBC: 4.66 Mil/uL (ref 4.22–5.81)
RDW: 15.6 % — ABNORMAL HIGH (ref 11.5–15.5)
WBC: 6 10*3/uL (ref 4.0–10.5)

## 2014-08-26 LAB — URINALYSIS, ROUTINE W REFLEX MICROSCOPIC
Bilirubin Urine: NEGATIVE
Leukocytes, UA: NEGATIVE
Nitrite: NEGATIVE
RBC / HPF: NONE SEEN (ref 0–?)
Specific Gravity, Urine: 1.025 (ref 1.000–1.030)
URINE GLUCOSE: NEGATIVE
Urobilinogen, UA: 0.2 (ref 0.0–1.0)
WBC, UA: NONE SEEN (ref 0–?)
pH: 6 (ref 5.0–8.0)

## 2014-08-26 LAB — HEPATITIS C ANTIBODY: HCV Ab: NEGATIVE

## 2014-08-26 LAB — LIPID PANEL
CHOL/HDL RATIO: 4
CHOLESTEROL: 220 mg/dL — AB (ref 0–200)
HDL: 49.6 mg/dL (ref >39.00)
NonHDL: 170.4
TRIGLYCERIDES: 224 mg/dL — AB (ref 0.0–149.0)
VLDL: 44.8 mg/dL — AB (ref 0.0–40.0)

## 2014-08-26 LAB — TSH: TSH: 2.19 u[IU]/mL (ref 0.35–4.50)

## 2014-08-26 LAB — FECAL OCCULT BLOOD, GUAIAC: Fecal Occult Blood: NEGATIVE

## 2014-08-26 LAB — LDL CHOLESTEROL, DIRECT: Direct LDL: 145 mg/dL

## 2014-08-26 MED ORDER — AZILSARTAN-CHLORTHALIDONE 40-12.5 MG PO TABS: 1.0000 | ORAL_TABLET | Freq: Every day | ORAL | Status: DC

## 2014-08-26 NOTE — Progress Notes (Signed)
Pre visit review using our clinic review tool, if applicable. No additional management support is needed unless otherwise documented below in the visit note. 

## 2014-08-26 NOTE — Patient Instructions (Signed)
Cough, Adult  A cough is a reflex that helps clear your throat and airways. It can help heal the body or may be a reaction to an irritated airway. A cough may only last 2 or 3 weeks (acute) or may last more than 8 weeks (chronic).  CAUSES Acute cough:  Viral or bacterial infections. Chronic cough:  Infections.  Allergies.  Asthma.  Post-nasal drip.  Smoking.  Heartburn or acid reflux.  Some medicines.  Chronic lung problems (COPD).  Cancer. SYMPTOMS   Cough.  Fever.  Chest pain.  Increased breathing rate.  High-pitched whistling sound when breathing (wheezing).  Colored mucus that you cough up (sputum). TREATMENT   A bacterial cough may be treated with antibiotic medicine.  A viral cough must run its course and will not respond to antibiotics.  Your caregiver may recommend other treatments if you have a chronic cough. HOME CARE INSTRUCTIONS   Only take over-the-counter or prescription medicines for pain, discomfort, or fever as directed by your caregiver. Use cough suppressants only as directed by your caregiver.  Use a cold steam vaporizer or humidifier in your bedroom or home to help loosen secretions.  Sleep in a semi-upright position if your cough is worse at night.  Rest as needed.  Stop smoking if you smoke. SEEK IMMEDIATE MEDICAL CARE IF:   You have pus in your sputum.  Your cough starts to worsen.  You cannot control your cough with suppressants and are losing sleep.  You begin coughing up blood.  You have difficulty breathing.  You develop pain which is getting worse or is uncontrolled with medicine.  You have a fever. MAKE SURE YOU:   Understand these instructions.  Will watch your condition.  Will get help right away if you are not doing well or get worse. Document Released: 09/22/2010 Document Revised: 06/18/2011 Document Reviewed: 09/22/2010 [employer] Patient Information 2015 [employer]. This information is not intended  to replace advice given to you by your health care provider. Make sure you discuss any questions you have with your health care provider. Health Maintenance A healthy lifestyle and preventative care can promote health and wellness.  Maintain regular health, dental, and eye exams.  Eat a healthy diet. Foods like vegetables, fruits, whole grains, low-fat dairy products, and lean protein foods contain the nutrients you need and are low in calories. Decrease your intake of foods high in solid fats, added sugars, and salt. Get information about a proper diet from your health care provider, if necessary.  Regular physical exercise is one of the most important things you can do for your health. Most adults should get at least 150 minutes of moderate-intensity exercise (any activity that increases your heart rate and causes you to sweat) each week. In addition, most adults need muscle-strengthening exercises on 2 or more days a week.   Maintain a healthy weight. The body mass index (BMI) is a screening tool to identify possible weight problems. It provides an estimate of body fat based on height and weight. Your health care provider can find your BMI and can help you achieve or maintain a healthy weight. For males 20 years and older:  A BMI below 18.5 is considered underweight.  A BMI of 18.5 to 24.9 is normal.  A BMI of 25 to 29.9 is considered overweight.  A BMI of 30 and above is considered obese.  Maintain normal blood lipids and cholesterol by exercising and minimizing your intake of saturated fat. Eat a balanced diet with   plenty of fruits and vegetables. Blood tests for lipids and cholesterol should begin at age 20 and be repeated every 5 years. If your lipid or cholesterol levels are high, you are over age 50, or you are at high risk for heart disease, you may need your cholesterol levels checked more frequently.Ongoing high lipid and cholesterol levels should be treated with medicines if diet  and exercise are not working.  If you smoke, find out from your health care provider how to quit. If you do not use tobacco, do not start.  Lung cancer screening is recommended for adults aged 55-80 years who are at high risk for developing lung cancer because of a history of smoking. A yearly low-dose CT scan of the lungs is recommended for people who have at least a 30-pack-year history of smoking and are current smokers or have quit within the past 15 years. A pack year of smoking is smoking an average of 1 pack of cigarettes a day for 1 year (for example, a 30-pack-year history of smoking could mean smoking 1 pack a day for 30 years or 2 packs a day for 15 years). Yearly screening should continue until the smoker has stopped smoking for at least 15 years. Yearly screening should be stopped for people who develop a health problem that would prevent them from having lung cancer treatment.  If you choose to drink alcohol, do not have more than 2 drinks per day. One drink is considered to be 12 oz (360 mL) of beer, 5 oz (150 mL) of wine, or 1.5 oz (45 mL) of liquor.  Avoid the use of street drugs. Do not share needles with anyone. Ask for help if you need support or instructions about stopping the use of drugs.  High blood pressure causes heart disease and increases the risk of stroke. Blood pressure should be checked at least every 1-2 years. Ongoing high blood pressure should be treated with medicines if weight loss and exercise are not effective.  If you are 45-79 years old, ask your health care provider if you should take aspirin to prevent heart disease.  Diabetes screening involves taking a blood sample to check your fasting blood sugar level. This should be done once every 3 years after age 45 if you are at a normal weight and without risk factors for diabetes. Testing should be considered at a younger age or be carried out more frequently if you are overweight and have at least 1 risk factor  for diabetes.  Colorectal cancer can be detected and often prevented. Most routine colorectal cancer screening begins at the age of 50 and continues through age 75. However, your health care provider may recommend screening at an earlier age if you have risk factors for colon cancer. On a yearly basis, your health care provider may provide home test kits to check for hidden blood in the stool. A small camera at the end of a tube may be used to directly examine the colon (sigmoidoscopy or colonoscopy) to detect the earliest forms of colorectal cancer. Talk to your health care provider about this at age 50 when routine screening begins. A direct exam of the colon should be repeated every 5-10 years through age 75, unless early forms of precancerous polyps or small growths are found.  People who are at an increased risk for hepatitis B should be screened for this virus. You are considered at high risk for hepatitis B if:  You were born in a country where   hepatitis B occurs often. Talk with your health care provider about which countries are considered high risk.  Your parents were born in a high-risk country and you have not received a shot to protect against hepatitis B (hepatitis B vaccine).  You have HIV or AIDS.  You use needles to inject street drugs.  You live with, or have sex with, someone who has hepatitis B.  You are a man who has sex with other men (MSM).  You get hemodialysis treatment.  You take certain medicines for conditions like cancer, organ transplantation, and autoimmune conditions.  Hepatitis C blood testing is recommended for all people born from 1945 through 1965 and any individual with known risk factors for hepatitis C.  Healthy men should no longer receive prostate-specific antigen (PSA) blood tests as part of routine cancer screening. Talk to your health care provider about prostate cancer screening.  Testicular cancer screening is not recommended for adolescents or  adult males who have no symptoms. Screening includes self-exam, a health care provider exam, and other screening tests. Consult with your health care provider about any symptoms you have or any concerns you have about testicular cancer.  Practice safe sex. Use condoms and avoid high-risk sexual practices to reduce the spread of sexually transmitted infections (STIs).  You should be screened for STIs, including gonorrhea and chlamydia if:  You are sexually active and are younger than 24 years.  You are older than 24 years, and your health care provider tells you that you are at risk for this type of infection.  Your sexual activity has changed since you were last screened, and you are at an increased risk for chlamydia or gonorrhea. Ask your health care provider if you are at risk.  If you are at risk of being infected with HIV, it is recommended that you take a prescription medicine daily to prevent HIV infection. This is called pre-exposure prophylaxis (PrEP). You are considered at risk if:  You are a man who has sex with other men (MSM).  You are a heterosexual man who is sexually active with multiple partners.  You take drugs by injection.  You are sexually active with a partner who has HIV.  Talk with your health care provider about whether you are at high risk of being infected with HIV. If you choose to begin PrEP, you should first be tested for HIV. You should then be tested every 3 months for as long as you are taking PrEP.  Use sunscreen. Apply sunscreen liberally and repeatedly throughout the day. You should seek shade when your shadow is shorter than you. Protect yourself by wearing long sleeves, pants, a wide-brimmed hat, and sunglasses year round whenever you are outdoors.  Tell your health care provider of new moles or changes in moles, especially if there is a change in shape or color. Also, tell your health care provider if a mole is larger than the size of a pencil  eraser.  A one-time screening for abdominal aortic aneurysm (AAA) and surgical repair of large AAAs by ultrasound is recommended for men aged 65-75 years who are current or former smokers.  Stay current with your vaccines (immunizations). Document Released: 09/22/2007 Document Revised: 03/31/2013 Document Reviewed: 08/21/2010 [employer] Patient Information 2015 [employer]. This information is not intended to replace advice given to you by your health care provider. Make sure you discuss any questions you have with your health care provider.  
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
## 2014-09-15 ENCOUNTER — Ambulatory Visit (INDEPENDENT_AMBULATORY_CARE_PROVIDER_SITE_OTHER): Payer: 59 | Admitting: Internal Medicine

## 2014-09-15 ENCOUNTER — Encounter: Payer: Self-pay | Admitting: Internal Medicine

## 2014-09-15 VITALS — BP 138/80 | HR 70 | Temp 98.1°F | Resp 16 | Ht 71.0 in | Wt 276.0 lb

## 2014-09-15 DIAGNOSIS — E785 Hyperlipidemia, unspecified: Secondary | ICD-10-CM | POA: Insufficient documentation

## 2014-09-15 DIAGNOSIS — N181 Chronic kidney disease, stage 1: Secondary | ICD-10-CM

## 2014-09-15 DIAGNOSIS — D759 Disease of blood and blood-forming organs, unspecified: Secondary | ICD-10-CM

## 2014-09-15 DIAGNOSIS — Z0000 Encounter for general adult medical examination without abnormal findings: Secondary | ICD-10-CM

## 2014-09-15 DIAGNOSIS — I10 Essential (primary) hypertension: Secondary | ICD-10-CM

## 2014-09-15 DIAGNOSIS — Z23 Encounter for immunization: Secondary | ICD-10-CM

## 2014-09-15 MED ORDER — ROSUVASTATIN CALCIUM 20 MG PO TABS: 20.0000 mg | ORAL_TABLET | Freq: Every day | ORAL | Status: DC

## 2014-09-15 NOTE — Progress Notes (Signed)
Subjective:  Patient ID: James Sawyer, male    DOB: [birth year]  Age: 65 y.o. MRN: [id number]  CC: Hypertension and Hyperlipidemia   HPI James Sawyer presents for BP check and to discuss his cholesterol level.  Outpatient Prescriptions Prior to Visit  Medication Sig Dispense Refill  . aspirin 81 MG tablet Take 81 mg by mouth daily.    . Azilsartan-Chlorthalidone (EDARBYCLOR) 40-12.5 MG TABS Take 1 tablet by mouth daily. 35 tablet 0  . Multiple Vitamin (MULTIVITAMIN) tablet Take 1 tablet by mouth daily.    . Omega-3 Fatty Acids (FISH OIL) 1000 MG CAPS Take 1,000 mg by mouth daily.      No facility-administered medications prior to visit.    ROS Review of Systems  Constitutional: Negative.  Negative for fever, chills, diaphoresis, appetite change and fatigue.  HENT: Negative.   Eyes: Negative.   Respiratory: Negative.  Negative for cough, choking, chest tightness, shortness of breath and stridor.   Cardiovascular: Negative.  Negative for chest pain, palpitations and leg swelling.  Gastrointestinal: Negative.  Negative for nausea, vomiting, abdominal pain, diarrhea, constipation and blood in stool.  Endocrine: Negative.   Genitourinary: Negative.  Negative for difficulty urinating.  Musculoskeletal: Negative.  Negative for myalgias, back pain, joint swelling and arthralgias.  Skin: Negative.   Allergic/Immunologic: Negative.   Neurological: Negative.  Negative for dizziness, seizures, syncope, speech difficulty, light-headedness, numbness and headaches.  Hematological: Negative.  Negative for adenopathy. Does not bruise/bleed easily.  Psychiatric/Behavioral: Negative.     Objective:  BP 138/80 mmHg  Pulse 70  Temp(Src) 98.1 F (36.7 C) (Oral)  Resp 16  Ht 5\' 11"  (1.803 m)  Wt 276 lb (125.193 kg)  BMI 38.51 kg/m2  SpO2 94%  BP Readings from Last 3 Encounters:  09/15/14 138/80  08/26/14 148/90  04/25/14 183/89    Wt Readings from Last 3 Encounters:  09/15/14 276 lb  (125.193 kg)  08/26/14 273 lb (123.832 kg)  11/30/13 255 lb 8 oz (115.894 kg)    Physical Exam  Constitutional: He is oriented to person, place, and time. He appears well-developed and well-nourished. No distress.  HENT:  Head: Normocephalic and atraumatic.  Mouth/Throat: No oropharyngeal exudate.  Eyes: Conjunctivae are normal. Right eye exhibits no discharge. Left eye exhibits no discharge. No scleral icterus.  Neck: Normal range of motion. Neck supple. No JVD present. No tracheal deviation present. No thyromegaly present.  Cardiovascular: Normal rate, regular rhythm, normal heart sounds and intact distal pulses.  Exam reveals no gallop and no friction rub.   No murmur heard. Pulmonary/Chest: Effort normal and breath sounds normal. No stridor. No respiratory distress. He has no wheezes. He has no rales. He exhibits no tenderness.  Abdominal: Soft. Bowel sounds are normal. He exhibits no distension and no mass. There is no tenderness. There is no rebound and no guarding.  Musculoskeletal: Normal range of motion. He exhibits edema (trace pitting edema in BLE). He exhibits no tenderness.  Lymphadenopathy:    He has no cervical adenopathy.  Neurological: He is oriented to person, place, and time.  Skin: Skin is warm and dry. No rash noted. He is not diaphoretic. No erythema. No pallor.  Psychiatric: He has a normal mood and affect. His behavior is normal. Judgment and thought content normal.  Vitals reviewed.   Lab Results  Component Value Date   WBC 6.0 08/26/2014   HGB 14.9 08/26/2014   HCT 43.8 08/26/2014   PLT 442.0* 08/26/2014   GLUCOSE 87 08/26/2014  CHOL 220* 08/26/2014   TRIG 224.0* 08/26/2014   HDL 49.60 08/26/2014   LDLDIRECT 145.0 08/26/2014   ALT 34 08/26/2014   AST 22 08/26/2014   NA 145 08/26/2014   K 3.6 08/26/2014   CL 109 08/26/2014   CREATININE 1.39 08/26/2014   BUN 12 08/26/2014   CO2 31 08/26/2014   TSH 2.19 08/26/2014   PSA 0.45 08/26/2014    Dg  Chest 2 View  08/26/2014   CLINICAL DATA:  New patient, COPD, hypertension, smoker, shortness of breath, cough  EXAM: CHEST  2 VIEW  COMPARISON:  03/29/2011  FINDINGS: Upper normal heart size.  Tortuous aorta.  Mediastinal contours and pulmonary vascularity normal.  Minimal RIGHT basilar atelectasis decreased and previous exam.  Lungs otherwise clear.  No pleural effusion or pneumothorax.  Osseous structures unremarkable.  IMPRESSION: Decreased RIGHT basilar atelectasis versus prior study.  No acute abnormalities.   Electronically Signed   By: Lavonia Dana M.D.   On: 08/26/2014 17:14    Assessment & Plan:   James Sawyer was seen today for hypertension and hyperlipidemia.  Diagnoses and all orders for this visit:  Routine general medical examination at a health care facility  Hyperlipidemia with target LDL less than 100 - Framingham risk score is 20% therefore will start a statin Orders: -     Discontinue: rosuvastatin (CRESTOR) 20 MG tablet; Take 1 tablet (20 mg total) by mouth daily. -     CK; Future -     atorvastatin (LIPITOR) 40 MG tablet; Take 1 tablet (40 mg total) by mouth daily.  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 rosuvastatin. I am also having him start on atorvastatin. Additionally, I am having him maintain his aspirin, multivitamin, Fish Oil, and Azilsartan-Chlorthalidone.  Meds ordered this encounter  Medications  . DISCONTD: rosuvastatin (CRESTOR) 20 MG tablet    Sig: Take 1 tablet (20 mg total) by mouth daily.    Dispense:  90 tablet    Refill:  3  . atorvastatin (LIPITOR) 40 MG tablet    Sig: Take 1 tablet  (40 mg total) by mouth daily.    Dispense:  90 tablet    Refill:  3     Follow-up: Return in about 4 months (around 01/15/2015).  Scarlette Calico, MD

## 2014-09-15 NOTE — Progress Notes (Signed)
Pre visit review using our clinic review tool, if applicable. No additional management support is needed unless otherwise documented below in the visit note. 

## 2014-09-15 NOTE — Patient Instructions (Signed)

## 2014-09-16 ENCOUNTER — Telehealth: Payer: Self-pay | Admitting: Internal Medicine

## 2014-09-16 MED ORDER — ATORVASTATIN CALCIUM 40 MG PO TABS: 40.0000 mg | ORAL_TABLET | Freq: Every day | ORAL | Status: DC

## 2014-09-16 NOTE — Telephone Encounter (Signed)
Pt called and said the meds that was given yesterday are to expensive.  They are $230.00 and he can not afford this med.  Is there anything else he can take for his cholesterol    Best number [phone]

## 2014-09-16 NOTE — Telephone Encounter (Signed)
Changed to a generic 

## 2014-09-17 NOTE — Telephone Encounter (Signed)
Notified pt of med change.Marland KitchenJohny Sawyer

## 2014-09-21 ENCOUNTER — Encounter: Payer: Self-pay | Admitting: Internal Medicine

## 2014-10-08 ENCOUNTER — Telehealth: Payer: Self-pay | Admitting: Internal Medicine

## 2014-10-08 ENCOUNTER — Encounter: Payer: Self-pay | Admitting: Internal Medicine

## 2014-10-08 ENCOUNTER — Ambulatory Visit (INDEPENDENT_AMBULATORY_CARE_PROVIDER_SITE_OTHER): Payer: 59 | Admitting: Internal Medicine

## 2014-10-08 VITALS — BP 134/82 | HR 61 | Ht 71.0 in | Wt 277.0 lb

## 2014-10-08 DIAGNOSIS — R0600 Dyspnea, unspecified: Secondary | ICD-10-CM

## 2014-10-08 DIAGNOSIS — I10 Essential (primary) hypertension: Secondary | ICD-10-CM

## 2014-10-08 DIAGNOSIS — I119 Hypertensive heart disease without heart failure: Secondary | ICD-10-CM

## 2014-10-08 MED ORDER — VALSARTAN-HYDROCHLOROTHIAZIDE 160-12.5 MG PO TABS: 1.0000 | ORAL_TABLET | Freq: Every day | ORAL | Status: DC

## 2014-10-08 MED ORDER — AZILSARTAN-CHLORTHALIDONE 40-12.5 MG PO TABS: 1.0000 | ORAL_TABLET | Freq: Every day | ORAL | Status: DC

## 2014-10-08 NOTE — Telephone Encounter (Signed)
Approved and sent via e-script.

## 2014-10-08 NOTE — Patient Instructions (Addendum)
Your physician has recommended you make the following change in your medication:  1.) STOP EDARBYCLOR 2.) START VALSARTAN-HYDROCHLOROTHIAZIDE 160-12.5 MG (DIOVAN HCT)  Your physician has requested that you have an echocardiogram. Echocardiography is a painless test that uses sound waves to create images of your heart. It provides your doctor with information about the size and shape of your heart and how well your heart's chambers and valves are working. This procedure takes approximately one hour. There are no restrictions for this procedure.  Your physician has requested that you have en exercise stress myoview. For further information please visit [email or web address]. Please follow instruction sheet, as given.  Your physician wants you to follow-up in: December 2016 WITH DR ROSS.  You will receive a reminder letter in the mail two months in advance. If you don't receive a letter, please call our office to schedule the follow-up appointment.
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
## 2014-10-08 NOTE — Telephone Encounter (Signed)
Patient need a refill of Azilsartan-Chlorthalidone (EDARBYCLOR) 40-12.5 MG TABS.

## 2014-10-20 ENCOUNTER — Telehealth (HOSPITAL_COMMUNITY): Payer: Self-pay | Admitting: *Deleted

## 2014-10-20 NOTE — Telephone Encounter (Signed)
Left message on voicemail in reference to upcoming appointment scheduled for 10/22/14. Phone number given for a call back so details instructions can be given. Rubby Barbary J Quentin Strebel, RN 

## 2014-10-20 NOTE — Telephone Encounter (Signed)
Patient given detailed instructions per Myocardial Perfusion Study Information Sheet for test on 10/22/14 at 11:45. Patient Notified to arrive 15 minutes early, and that it is imperative to arrive on time for appointment to keep from having the test rescheduled. Patient verbalized understanding. Hubbard Robinson, RN

## 2014-10-22 ENCOUNTER — Other Ambulatory Visit: Payer: Self-pay

## 2014-10-22 ENCOUNTER — Ambulatory Visit (HOSPITAL_COMMUNITY): Payer: 59 | Attending: Cardiology

## 2014-10-22 ENCOUNTER — Ambulatory Visit (HOSPITAL_COMMUNITY): Payer: 59

## 2014-10-22 ENCOUNTER — Ambulatory Visit (HOSPITAL_BASED_OUTPATIENT_CLINIC_OR_DEPARTMENT_OTHER): Payer: 59

## 2014-10-22 DIAGNOSIS — I10 Essential (primary) hypertension: Secondary | ICD-10-CM | POA: Insufficient documentation

## 2014-10-22 DIAGNOSIS — E785 Hyperlipidemia, unspecified: Secondary | ICD-10-CM | POA: Diagnosis not present

## 2014-10-22 DIAGNOSIS — J449 Chronic obstructive pulmonary disease, unspecified: Secondary | ICD-10-CM | POA: Insufficient documentation

## 2014-10-22 DIAGNOSIS — R0600 Dyspnea, unspecified: Secondary | ICD-10-CM

## 2014-10-22 DIAGNOSIS — R9439 Abnormal result of other cardiovascular function study: Secondary | ICD-10-CM | POA: Diagnosis not present

## 2014-10-22 DIAGNOSIS — R079 Chest pain, unspecified: Secondary | ICD-10-CM | POA: Insufficient documentation

## 2014-10-22 DIAGNOSIS — Z720 Tobacco use: Secondary | ICD-10-CM | POA: Insufficient documentation

## 2014-10-22 LAB — MYOCARDIAL PERFUSION IMAGING
CHL CUP NUCLEAR SRS: 6
CHL CUP NUCLEAR SSS: 10
LVDIAVOL: 139 mL
LVSYSVOL: 75 mL
NUC STRESS TID: 0.87
Peak HR: 100 {beats}/min
RATE: 0.23
Rest HR: 88 {beats}/min
SDS: 4

## 2014-10-22 MED ORDER — REGADENOSON 0.4 MG/5ML IV SOLN
0.4000 mg | Freq: Once | INTRAVENOUS | Status: AC
  Administered 2014-10-22: 0.4 mg via INTRAVENOUS

## 2014-10-22 MED ORDER — TECHNETIUM TC 99M SESTAMIBI GENERIC - CARDIOLITE
10.7000 | Freq: Once | INTRAVENOUS | Status: AC | PRN
  Administered 2014-10-22: 11 via INTRAVENOUS

## 2014-10-22 MED ORDER — TECHNETIUM TC 99M SESTAMIBI GENERIC - CARDIOLITE
31.7000 | Freq: Once | INTRAVENOUS | Status: AC | PRN
  Administered 2014-10-22: 31.7 via INTRAVENOUS

## 2014-10-26 ENCOUNTER — Telehealth: Payer: Self-pay | Admitting: Internal Medicine

## 2014-10-26 DIAGNOSIS — R9439 Abnormal result of other cardiovascular function study: Secondary | ICD-10-CM

## 2014-10-26 DIAGNOSIS — Z01812 Encounter for preprocedural laboratory examination: Secondary | ICD-10-CM

## 2014-10-26 NOTE — Telephone Encounter (Signed)
Discussed results and scheduling cardiac catheterization. Pt will call back once he decides what day he would like to have cath done.

## 2014-10-26 NOTE — Telephone Encounter (Signed)
Follow Up  Pt returning Kim's phone call

## 2014-10-26 NOTE — Telephone Encounter (Signed)
Left message to call back.   Per Results: Myoview  Left msg on machine for pt to call back for results    Stress test is not normal It suggests that theie is a blood flow problem to the inferior and lateral sides of the heart    Based on this I would recomm a left heart catheterization to defne Vessels Possibly perform angioplast    Would need precath labs to be done.

## 2014-10-26 NOTE — Telephone Encounter (Signed)
New message     Pt returning call regarding test results. Please call to discuss

## 2014-10-27 NOTE — Telephone Encounter (Signed)
Informed patient of results and verbal understanding expressed.  Reviewed catheterization procedure and risks and benefits. Patient agrees to L heart cath. Procedure scheduled 7/27 at 1300 with Dr. Angelena Form. Patient to have pre-cath lab work and pick up instruction letter on Tuesday, 7/26. Patient agrees with treatment plan.

## 2014-10-27 NOTE — Telephone Encounter (Signed)
-----   Message from Dorris Carnes V, MD sent at 10/25/2014 11:11 AM EDT ----- Left msg on machine for pt to call back for results Stress test is not normal  It suggests that theie is a blood flow problem to the inferior and lateral sides of the heart Based on this I would recomm a left heart catheterization to defne  Vessels  Possibly perform angioplast Would need precath labs to be done.

## 2014-10-28 ENCOUNTER — Encounter (HOSPITAL_COMMUNITY): Payer: Self-pay | Admitting: Pharmacy Technician

## 2014-11-02 ENCOUNTER — Telehealth: Payer: Self-pay

## 2014-11-02 ENCOUNTER — Other Ambulatory Visit (INDEPENDENT_AMBULATORY_CARE_PROVIDER_SITE_OTHER): Payer: 59 | Admitting: *Deleted

## 2014-11-02 ENCOUNTER — Telehealth: Payer: Self-pay | Admitting: *Deleted

## 2014-11-02 DIAGNOSIS — E876 Hypokalemia: Secondary | ICD-10-CM

## 2014-11-02 DIAGNOSIS — R931 Abnormal findings on diagnostic imaging of heart and coronary circulation: Secondary | ICD-10-CM | POA: Diagnosis not present

## 2014-11-02 DIAGNOSIS — R9439 Abnormal result of other cardiovascular function study: Secondary | ICD-10-CM

## 2014-11-02 DIAGNOSIS — Z01812 Encounter for preprocedural laboratory examination: Secondary | ICD-10-CM | POA: Diagnosis not present

## 2014-11-02 LAB — CBC WITH DIFFERENTIAL/PLATELET
BASOS PCT: 0.3 % (ref 0.0–3.0)
Basophils Absolute: 0 10*3/uL (ref 0.0–0.1)
Eosinophils Absolute: 0.1 10*3/uL (ref 0.0–0.7)
Eosinophils Relative: 1.9 % (ref 0.0–5.0)
HCT: 43.3 % (ref 39.0–52.0)
Hemoglobin: 14.5 g/dL (ref 13.0–17.0)
LYMPHS PCT: 47.2 % — AB (ref 12.0–46.0)
Lymphs Abs: 2.9 10*3/uL (ref 0.7–4.0)
MCHC: 33.4 g/dL (ref 30.0–36.0)
MCV: 96 fl (ref 78.0–100.0)
MONOS PCT: 5.4 % (ref 3.0–12.0)
Monocytes Absolute: 0.3 10*3/uL (ref 0.1–1.0)
NEUTROS ABS: 2.8 10*3/uL (ref 1.4–7.7)
Neutrophils Relative %: 45.2 % (ref 43.0–77.0)
Platelets: 462 10*3/uL — ABNORMAL HIGH (ref 150.0–400.0)
RBC: 4.51 Mil/uL (ref 4.22–5.81)
RDW: 15.2 % (ref 11.5–15.5)
WBC: 6.2 10*3/uL (ref 4.0–10.5)

## 2014-11-02 LAB — PROTIME-INR
INR: 1 ratio (ref 0.8–1.0)
PROTHROMBIN TIME: 10.6 s (ref 9.6–13.1)

## 2014-11-02 LAB — BASIC METABOLIC PANEL
BUN: 17 mg/dL (ref 6–23)
CHLORIDE: 106 meq/L (ref 96–112)
CO2: 29 mEq/L (ref 19–32)
CREATININE: 1.5 mg/dL (ref 0.40–1.50)
Calcium: 9.3 mg/dL (ref 8.4–10.5)
GFR: 60.6 mL/min (ref >60.00)
Glucose, Bld: 173 mg/dL — ABNORMAL HIGH (ref 70–99)
Potassium: 3.2 mEq/L — ABNORMAL LOW (ref 3.5–5.1)
Sodium: 144 mEq/L (ref 135–145)

## 2014-11-02 LAB — APTT: aPTT: 36.3 s — ABNORMAL HIGH (ref 23.4–32.7)

## 2014-11-02 MED ORDER — POTASSIUM CHLORIDE ER 20 MEQ PO TBCR: EXTENDED_RELEASE_TABLET | ORAL | Status: DC

## 2014-11-02 NOTE — Telephone Encounter (Signed)
Pt called to get directions for his Cath scheduled for tomorrow.   Pt provided information as directed in letter, pt expressed understanding, no additional questions at this time.

## 2014-11-02 NOTE — Telephone Encounter (Signed)
Pt's potassium level todey is 3.2. Richardson Dopp recommend for pt to take Potassium 40 mEq tablet today only , then start 20 mEq once daily thereafter. Pt to have BMET tomorrow AM and again in one week. Pt is aware of PA's recommendations. A prescription was send to [facility]. An appointment for labs (BMET)for tomorrow at 10:00 AM and in a week Pt is aware.

## 2014-11-03 ENCOUNTER — Encounter (HOSPITAL_COMMUNITY)
Admission: RE | Disposition: A | Discharge status: Home / Self Care | Payer: Self-pay | Source: Ambulatory Visit | Attending: Cardiovascular Disease

## 2014-11-03 ENCOUNTER — Other Ambulatory Visit: Payer: Self-pay

## 2014-11-03 ENCOUNTER — Encounter (HOSPITAL_COMMUNITY): Payer: Self-pay | Admitting: *Deleted

## 2014-11-03 ENCOUNTER — Other Ambulatory Visit: Payer: 59 | Admitting: *Deleted

## 2014-11-03 ENCOUNTER — Ambulatory Visit (HOSPITAL_COMMUNITY)
Admission: RE | Admit: 2014-11-03 | Discharge: 2014-11-04 | Disposition: A | Discharge status: Home / Self Care | Payer: 59 | Source: Ambulatory Visit | Attending: Cardiovascular Disease | Admitting: Cardiovascular Disease

## 2014-11-03 DIAGNOSIS — F17210 Nicotine dependence, cigarettes, uncomplicated: Secondary | ICD-10-CM | POA: Insufficient documentation

## 2014-11-03 DIAGNOSIS — N183 Chronic kidney disease, stage 3 unspecified: Secondary | ICD-10-CM | POA: Diagnosis present

## 2014-11-03 DIAGNOSIS — N1832 Chronic kidney disease, stage 3b: Secondary | ICD-10-CM | POA: Diagnosis present

## 2014-11-03 DIAGNOSIS — J449 Chronic obstructive pulmonary disease, unspecified: Secondary | ICD-10-CM | POA: Insufficient documentation

## 2014-11-03 DIAGNOSIS — I208 Other forms of angina pectoris: Secondary | ICD-10-CM | POA: Diagnosis present

## 2014-11-03 DIAGNOSIS — N189 Chronic kidney disease, unspecified: Secondary | ICD-10-CM | POA: Diagnosis not present

## 2014-11-03 DIAGNOSIS — R0683 Snoring: Secondary | ICD-10-CM | POA: Diagnosis present

## 2014-11-03 DIAGNOSIS — R55 Syncope and collapse: Secondary | ICD-10-CM | POA: Diagnosis not present

## 2014-11-03 DIAGNOSIS — E785 Hyperlipidemia, unspecified: Secondary | ICD-10-CM | POA: Diagnosis present

## 2014-11-03 DIAGNOSIS — Z7982 Long term (current) use of aspirin: Secondary | ICD-10-CM | POA: Diagnosis not present

## 2014-11-03 DIAGNOSIS — I25119 Atherosclerotic heart disease of native coronary artery with unspecified angina pectoris: Secondary | ICD-10-CM | POA: Diagnosis not present

## 2014-11-03 DIAGNOSIS — I25118 Atherosclerotic heart disease of native coronary artery with other forms of angina pectoris: Secondary | ICD-10-CM | POA: Diagnosis not present

## 2014-11-03 DIAGNOSIS — R9439 Abnormal result of other cardiovascular function study: Secondary | ICD-10-CM

## 2014-11-03 DIAGNOSIS — I2089 Other forms of angina pectoris: Secondary | ICD-10-CM | POA: Diagnosis present

## 2014-11-03 DIAGNOSIS — I129 Hypertensive chronic kidney disease with stage 1 through stage 4 chronic kidney disease, or unspecified chronic kidney disease: Secondary | ICD-10-CM | POA: Diagnosis not present

## 2014-11-03 DIAGNOSIS — R0602 Shortness of breath: Secondary | ICD-10-CM | POA: Diagnosis present

## 2014-11-03 DIAGNOSIS — I441 Atrioventricular block, second degree: Secondary | ICD-10-CM | POA: Diagnosis not present

## 2014-11-03 DIAGNOSIS — R0609 Other forms of dyspnea: Secondary | ICD-10-CM | POA: Diagnosis present

## 2014-11-03 DIAGNOSIS — F17200 Nicotine dependence, unspecified, uncomplicated: Secondary | ICD-10-CM | POA: Diagnosis present

## 2014-11-03 DIAGNOSIS — I10 Essential (primary) hypertension: Secondary | ICD-10-CM | POA: Diagnosis present

## 2014-11-03 HISTORY — PX: CARDIAC CATHETERIZATION: SHX172

## 2014-11-03 HISTORY — DX: Atherosclerotic heart disease of native coronary artery without angina pectoris: I25.10

## 2014-11-03 LAB — CREATININE, SERUM
CREATININE: 1.47 mg/dL — AB (ref 0.61–1.24)
GFR calc Af Amer: 57 mL/min — ABNORMAL LOW (ref >60)
GFR calc non Af Amer: 49 mL/min — ABNORMAL LOW (ref >60)

## 2014-11-03 LAB — CBC
HCT: 41 % (ref 39.0–52.0)
Hemoglobin: 13.8 g/dL (ref 13.0–17.0)
MCH: 32.3 pg (ref 26.0–34.0)
MCHC: 33.7 g/dL (ref 30.0–36.0)
MCV: 96 fL (ref 78.0–100.0)
PLATELETS: 397 10*3/uL (ref 150–400)
RBC: 4.27 MIL/uL (ref 4.22–5.81)
RDW: 14.6 % (ref 11.5–15.5)
WBC: 5.8 10*3/uL (ref 4.0–10.5)

## 2014-11-03 LAB — POCT ACTIVATED CLOTTING TIME: ACTIVATED CLOTTING TIME: 497 s

## 2014-11-03 LAB — POTASSIUM: POTASSIUM: 3.3 mmol/L — AB (ref 3.5–5.1)

## 2014-11-03 SURGERY — LEFT HEART CATH AND CORONARY ANGIOGRAPHY

## 2014-11-03 MED ORDER — SODIUM CHLORIDE 0.9 % IJ SOLN: 3.0000 mL | INTRAMUSCULAR | Status: DC | PRN

## 2014-11-03 MED ORDER — ASPIRIN 81 MG PO CHEW
CHEWABLE_TABLET | ORAL | Status: DC | PRN
  Administered 2014-11-03: 243 mg via ORAL

## 2014-11-03 MED ORDER — SODIUM CHLORIDE 0.9 % IV SOLN
INTRAVENOUS | Status: DC
  Administered 2014-11-03: 100 mL/h via INTRAVENOUS

## 2014-11-03 MED ORDER — VERAPAMIL HCL 2.5 MG/ML IV SOLN
INTRAVENOUS | Status: AC
  Filled 2014-11-03: qty 2

## 2014-11-03 MED ORDER — ASPIRIN 81 MG PO CHEW
CHEWABLE_TABLET | ORAL | Status: AC
  Filled 2014-11-03: qty 1

## 2014-11-03 MED ORDER — HYDROCHLOROTHIAZIDE 12.5 MG PO CAPS
12.5000 mg | ORAL_CAPSULE | Freq: Every day | ORAL | Status: DC
  Administered 2014-11-03 – 2014-11-04 (×2): 12.5 mg via ORAL
  Filled 2014-11-03 (×2): qty 1

## 2014-11-03 MED ORDER — LIDOCAINE HCL (PF) 1 % IJ SOLN
INTRAMUSCULAR | Status: DC | PRN
  Administered 2014-11-03: 5 mL via SUBCUTANEOUS

## 2014-11-03 MED ORDER — SODIUM CHLORIDE 0.9 % IV SOLN
250.0000 mg | INTRAVENOUS | Status: DC | PRN
  Administered 2014-11-03: 1.75 mg/kg/h via INTRAVENOUS

## 2014-11-03 MED ORDER — SODIUM CHLORIDE 0.9 % IJ SOLN: 3.0000 mL | Freq: Two times a day (BID) | INTRAMUSCULAR | Status: DC

## 2014-11-03 MED ORDER — SODIUM CHLORIDE 0.9 % IV SOLN: 250.0000 mL | INTRAVENOUS | Status: DC | PRN

## 2014-11-03 MED ORDER — HEPARIN (PORCINE) IN NACL 2-0.9 UNIT/ML-% IJ SOLN
INTRAMUSCULAR | Status: AC
  Filled 2014-11-03: qty 1000

## 2014-11-03 MED ORDER — LABETALOL HCL 5 MG/ML IV SOLN
20.0000 mg | Freq: Once | INTRAVENOUS | Status: AC
  Administered 2014-11-03: 20 mg via INTRAVENOUS

## 2014-11-03 MED ORDER — BIVALIRUDIN BOLUS VIA INFUSION - CUPID
INTRAVENOUS | Status: DC | PRN
  Administered 2014-11-03: 92.85 mg via INTRAVENOUS

## 2014-11-03 MED ORDER — VERAPAMIL HCL 2.5 MG/ML IV SOLN
INTRAVENOUS | Status: DC | PRN
  Administered 2014-11-03: 14:00:00 via INTRA_ARTERIAL

## 2014-11-03 MED ORDER — ONDANSETRON HCL 4 MG/2ML IJ SOLN: 4.0000 mg | Freq: Four times a day (QID) | INTRAMUSCULAR | Status: DC | PRN

## 2014-11-03 MED ORDER — HEPARIN (PORCINE) IN NACL 2-0.9 UNIT/ML-% IJ SOLN
INTRAMUSCULAR | Status: DC | PRN
  Administered 2014-11-03: 14:00:00
  Administered 2014-11-03: 6000 mL

## 2014-11-03 MED ORDER — VALSARTAN-HYDROCHLOROTHIAZIDE 160-12.5 MG PO TABS: 2.0000 | ORAL_TABLET | Freq: Every day | ORAL | Status: DC

## 2014-11-03 MED ORDER — FENTANYL CITRATE (PF) 100 MCG/2ML IJ SOLN
INTRAMUSCULAR | Status: AC
  Filled 2014-11-03: qty 2

## 2014-11-03 MED ORDER — SODIUM CHLORIDE 0.9 % WEIGHT BASED INFUSION: 1.0000 mL/kg/h | INTRAVENOUS | Status: DC

## 2014-11-03 MED ORDER — POTASSIUM CHLORIDE CRYS ER 20 MEQ PO TBCR
EXTENDED_RELEASE_TABLET | ORAL | Status: AC
  Filled 2014-11-03: qty 2

## 2014-11-03 MED ORDER — ATORVASTATIN CALCIUM 40 MG PO TABS
40.0000 mg | ORAL_TABLET | Freq: Every day | ORAL | Status: DC
  Administered 2014-11-03: 40 mg via ORAL
  Filled 2014-11-03: qty 1

## 2014-11-03 MED ORDER — CLOPIDOGREL BISULFATE 300 MG PO TABS
ORAL_TABLET | ORAL | Status: AC
  Filled 2014-11-03: qty 1

## 2014-11-03 MED ORDER — FENTANYL CITRATE (PF) 100 MCG/2ML IJ SOLN
INTRAMUSCULAR | Status: DC | PRN
  Administered 2014-11-03: 25 ug via INTRAVENOUS

## 2014-11-03 MED ORDER — CLOPIDOGREL BISULFATE 75 MG PO TABS
75.0000 mg | ORAL_TABLET | Freq: Every day | ORAL | Status: DC
  Administered 2014-11-04: 75 mg via ORAL
  Filled 2014-11-03: qty 1

## 2014-11-03 MED ORDER — MIDAZOLAM HCL 2 MG/2ML IJ SOLN
INTRAMUSCULAR | Status: AC
Start: 2014-11-03 — End: 2014-11-03
  Filled 2014-11-03: qty 2

## 2014-11-03 MED ORDER — IOHEXOL 350 MG/ML SOLN
INTRAVENOUS | Status: DC | PRN
  Administered 2014-11-03: 110 mL via INTRA_ARTERIAL

## 2014-11-03 MED ORDER — IRBESARTAN 75 MG PO TABS
150.0000 mg | ORAL_TABLET | Freq: Every day | ORAL | Status: DC
  Administered 2014-11-03 – 2014-11-04 (×2): 150 mg via ORAL
  Filled 2014-11-03 (×2): qty 2

## 2014-11-03 MED ORDER — SODIUM CHLORIDE 0.9 % WEIGHT BASED INFUSION
3.0000 mL/kg/h | INTRAVENOUS | Status: DC
  Administered 2014-11-03: 3 mL/kg/h via INTRAVENOUS

## 2014-11-03 MED ORDER — ENOXAPARIN SODIUM 40 MG/0.4ML ~~LOC~~ SOLN
40.0000 mg | SUBCUTANEOUS | Status: DC
  Filled 2014-11-03 (×2): qty 0.4

## 2014-11-03 MED ORDER — ASPIRIN 81 MG PO CHEW
81.0000 mg | CHEWABLE_TABLET | Freq: Every day | ORAL | Status: DC
  Administered 2014-11-03 – 2014-11-04 (×2): 81 mg via ORAL
  Filled 2014-11-03 (×3): qty 1

## 2014-11-03 MED ORDER — MIDAZOLAM HCL 2 MG/2ML IJ SOLN
INTRAMUSCULAR | Status: DC | PRN
  Administered 2014-11-03: 1 mg via INTRAVENOUS

## 2014-11-03 MED ORDER — CLOPIDOGREL BISULFATE 300 MG PO TABS
ORAL_TABLET | ORAL | Status: DC | PRN
  Administered 2014-11-03: 600 mg via ORAL

## 2014-11-03 MED ORDER — ACETAMINOPHEN 325 MG PO TABS: 650.0000 mg | ORAL_TABLET | ORAL | Status: DC | PRN

## 2014-11-03 MED ORDER — HEPARIN SODIUM (PORCINE) 1000 UNIT/ML IJ SOLN
INTRAMUSCULAR | Status: AC
  Filled 2014-11-03: qty 1

## 2014-11-03 MED ORDER — BIVALIRUDIN 250 MG IV SOLR
INTRAVENOUS | Status: AC
  Filled 2014-11-03: qty 250

## 2014-11-03 MED ORDER — ASPIRIN 81 MG PO CHEW
81.0000 mg | CHEWABLE_TABLET | ORAL | Status: AC
  Administered 2014-11-03: 81 mg via ORAL

## 2014-11-03 MED ORDER — POTASSIUM CHLORIDE CRYS ER 20 MEQ PO TBCR
40.0000 meq | EXTENDED_RELEASE_TABLET | Freq: Once | ORAL | Status: AC
  Administered 2014-11-03: 40 meq via ORAL

## 2014-11-03 MED ORDER — LABETALOL HCL 5 MG/ML IV SOLN
INTRAVENOUS | Status: AC
  Filled 2014-11-03: qty 4

## 2014-11-03 MED ORDER — LIDOCAINE HCL (PF) 1 % IJ SOLN
INTRAMUSCULAR | Status: AC
  Filled 2014-11-03: qty 30

## 2014-11-03 SURGICAL SUPPLY — 17 items
BALLN EUPHORA RX 3.0X12 (BALLOONS) ×3
BALLN ~~LOC~~ EUPHORA RX 4.5X12 (BALLOONS) ×3
BALLOON EUPHORA RX 3.0X12 (BALLOONS) ×1 IMPLANT
BALLOON ~~LOC~~ EUPHORA RX 4.5X12 (BALLOONS) ×1 IMPLANT
CATH OPTITORQUE JACKY 4.0 5F (CATHETERS) ×3 IMPLANT
DEVICE RAD COMP TR BAND LRG (VASCULAR PRODUCTS) ×3 IMPLANT
GLIDESHEATH SLEND SS 6F .021 (SHEATH) ×3 IMPLANT
GUIDE CATH RUNWAY 6FR FR4 (CATHETERS) ×3 IMPLANT
KIT ENCORE 26 ADVANTAGE (KITS) ×3 IMPLANT
KIT HEART LEFT (KITS) ×3 IMPLANT
PACK CARDIAC CATHETERIZATION (CUSTOM PROCEDURE TRAY) ×3 IMPLANT
STENT RESOLUTE INTEG 4.0X15 (Permanent Stent) ×3 IMPLANT
TRANSDUCER W/STOPCOCK (MISCELLANEOUS) ×3 IMPLANT
TUBING CIL FLEX 10 FLL-RA (TUBING) ×3 IMPLANT
WIRE INTUITION PROPEL ST 180CM (WIRE) ×3 IMPLANT
WIRE RUNTHROUGH .014X180CM (WIRE) ×3 IMPLANT
WIRE SAFE-T 1.5MM-J .035X260CM (WIRE) ×3 IMPLANT

## 2014-11-03 NOTE — Research (Signed)
CAD LAD Informed Consent   Subject Name: James Sawyer  Subject met inclusion and exclusion criteria.  The informed consent form, study requirements and expectations were reviewed with the subject and questions and concerns were addressed prior to the signing of the consent form.  The subject verbalized understanding of the trail requirements.  The subject agreed to participate in the CAD LAD trial and signed the informed consent.  The informed consent was obtained prior to performance of any protocol-specific procedures for the subject.  A copy of the signed informed consent was given to the subject and a copy was placed in the subject's medical record.  Veida Spira 11/03/2014, 12:05 PM
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
## 2014-11-03 NOTE — Addendum Note (Signed)
Addended by: Eulis Foster on: 11/03/2014 10:06 AM   Modules accepted: Orders

## 2014-11-03 NOTE — Interval H&P Note (Signed)
History and Physical Interval Note:  11/03/2014 1:58 PM  James Sawyer  has presented today for surgery, with the diagnosis of abnormal stress test  The various methods of treatment have been discussed with the patient and family. After consideration of risks, benefits and other options for treatment, the patient has consented to  Procedure(s): Left Heart Cath and Coronary Angiography (N/A) as a surgical intervention .  The patient's history has been reviewed, patient examined, no change in status, stable for surgery.  I have reviewed the patient's chart and labs.  Questions were answered to the patient's satisfaction.     [name]

## 2014-11-04 ENCOUNTER — Encounter (HOSPITAL_COMMUNITY): Payer: Self-pay | Admitting: Cardiovascular Disease

## 2014-11-04 DIAGNOSIS — I208 Other forms of angina pectoris: Secondary | ICD-10-CM

## 2014-11-04 DIAGNOSIS — N189 Chronic kidney disease, unspecified: Secondary | ICD-10-CM | POA: Diagnosis not present

## 2014-11-04 DIAGNOSIS — E785 Hyperlipidemia, unspecified: Secondary | ICD-10-CM | POA: Diagnosis not present

## 2014-11-04 DIAGNOSIS — I25119 Atherosclerotic heart disease of native coronary artery with unspecified angina pectoris: Secondary | ICD-10-CM | POA: Diagnosis not present

## 2014-11-04 DIAGNOSIS — I441 Atrioventricular block, second degree: Secondary | ICD-10-CM

## 2014-11-04 DIAGNOSIS — R9439 Abnormal result of other cardiovascular function study: Secondary | ICD-10-CM

## 2014-11-04 DIAGNOSIS — I129 Hypertensive chronic kidney disease with stage 1 through stage 4 chronic kidney disease, or unspecified chronic kidney disease: Secondary | ICD-10-CM | POA: Diagnosis not present

## 2014-11-04 LAB — CBC
HEMATOCRIT: 40.4 % (ref 39.0–52.0)
Hemoglobin: 13.4 g/dL (ref 13.0–17.0)
MCH: 32.1 pg (ref 26.0–34.0)
MCHC: 33.2 g/dL (ref 30.0–36.0)
MCV: 96.9 fL (ref 78.0–100.0)
Platelets: 393 10*3/uL (ref 150–400)
RBC: 4.17 MIL/uL — ABNORMAL LOW (ref 4.22–5.81)
RDW: 14.6 % (ref 11.5–15.5)
WBC: 5.8 10*3/uL (ref 4.0–10.5)

## 2014-11-04 LAB — BASIC METABOLIC PANEL
ANION GAP: 6 (ref 5–15)
BUN: 14 mg/dL (ref 6–20)
CO2: 27 mmol/L (ref 22–32)
Calcium: 8.6 mg/dL — ABNORMAL LOW (ref 8.9–10.3)
Chloride: 110 mmol/L (ref 101–111)
Creatinine, Ser: 1.34 mg/dL — ABNORMAL HIGH (ref 0.61–1.24)
GFR calc non Af Amer: 55 mL/min — ABNORMAL LOW (ref >60)
Glucose, Bld: 106 mg/dL — ABNORMAL HIGH (ref 65–99)
POTASSIUM: 3.4 mmol/L — AB (ref 3.5–5.1)
Sodium: 143 mmol/L (ref 135–145)

## 2014-11-04 MED ORDER — CLOPIDOGREL BISULFATE 75 MG PO TABS: 75.0000 mg | ORAL_TABLET | Freq: Every day | ORAL | Status: DC

## 2014-11-04 NOTE — Discharge Instructions (Signed)
No driving for 24 hours. No lifting over 5 lbs for 1 week. No sexual activity for 1 week. Keep procedure site clean & dry. If you notice increased pain, swelling, bleeding or pus, call/return!  You may shower, but no soaking baths/hot tubs/pools for 1 week.  ° ° °

## 2014-11-04 NOTE — Progress Notes (Signed)
CARDIAC REHAB PHASE I   PRE:  Rate/Rhythm: 65 SR  BP:  Sitting: 155/76        SaO2: 97 RA  MODE:  Ambulation: 1000 ft   POST:  Rate/Rhythm: 88 SR  BP:  Sitting: 185/98         SaO2: 98 RA  Pt ambulated 1000 ft on RA, independent, steady gait, tolerated well.  Pt denies DOE, cp, dizziness, declined rest stop. Completed PCI/stent education.  Reviewed risk factors, tobacco cessation (gave pt fake cigarette), anti-platelet therapy, stent card, activity restrictions, ntg, exercise, heart healthy diet, portion control, and phase 2 cardiac rehab. Pt verbalized understanding, but not very receptive to education. Pt declines phase 2 cardiac rehab states he does not have transportation and cannot afford it. Pt does report recent drug use (does not specify type of drug), denies alcohol, does not plan to quit smoking at this time. Pt counseled on tobacco cessation and substance abuse as risk factors for heart disease. Pt verbalized understanding, declines to speak to Education officer, museum. Pt also reports medication non-compliance in the past and states he cannot take cholesterol medication because it made him pass out. Pt to bed after walk, call bell within reach.   [phone]      Lenna Sciara, RN, BSN 11/04/2014 10:36 AM

## 2014-11-04 NOTE — Progress Notes (Signed)
SUBJECTIVE:  The patient came in yesterday for catheterization after an abnormal stress test. Catheterization revealed high-grade disease of the right coronary artery. PCI was done. By history there was good LV function by echo. LVEDP was elevated. The patient's meds were increased. He is stable to go home on dual antiplatelets therapy. During the night he had very limited 2-1 AV block. This was while he was sleeping. Sleep apnea needs to be ruled out.   Filed Vitals:   11/04/14 0000 11/04/14 0246 11/04/14 0649 11/04/14 0825  BP: 161/85 122/51 148/58 158/76  Pulse: 67 59 56 62  Temp: 98 F (36.7 C)  98.1 F (36.7 C) 98 F (36.7 C)  TempSrc: Oral  Oral Oral  Resp: 16 18 20 18   Height:      Weight: 279 lb 12.2 oz (126.9 kg)     SpO2: 94% 95% 97% 97%     Intake/Output Summary (Last 24 hours) at 11/04/14 0903 Last data filed at 11/04/14 0659  Gross per 24 hour  Intake    235 ml  Output   2000 ml  Net  -1765 ml    LABS: Basic Metabolic Panel:  Recent Labs  11/02/14 1054 11/03/14 1146 11/03/14 1944 11/04/14 0252  NA 144  --   --  143  K 3.2* 3.3*  --  3.4*  CL 106  --   --  110  CO2 29  --   --  27  GLUCOSE 173*  --   --  106*  BUN 17  --   --  14  CREATININE 1.50  --  1.47* 1.34*  CALCIUM 9.3  --   --  8.6*   Liver Function Tests: No results for input(s): AST, ALT, ALKPHOS, BILITOT, PROT, ALBUMIN in the last 72 hours. No results for input(s): LIPASE, AMYLASE in the last 72 hours. CBC:  Recent Labs  11/02/14 1054 11/03/14 1944 11/04/14 0252  WBC 6.2 5.8 5.8  NEUTROABS 2.8  --   --   HGB 14.5 13.8 13.4  HCT 43.3 41.0 40.4  MCV 96.0 96.0 96.9  PLT 462.0* 397 393   Cardiac Enzymes: No results for input(s): CKTOTAL, CKMB, CKMBINDEX, TROPONINI in the last 72 hours. BNP: Invalid input(s): POCBNP D-Dimer: No results for input(s): DDIMER in the last 72 hours. Hemoglobin A1C: No results for input(s): HGBA1C in the last 72 hours. Fasting Lipid Panel: No  results for input(s): CHOL, HDL, LDLCALC, TRIG, CHOLHDL, LDLDIRECT in the last 72 hours. Thyroid Function Tests: No results for input(s): TSH, T4TOTAL, T3FREE, THYROIDAB in the last 72 hours.  Invalid input(s): FREET3  RADIOLOGY: No results found.  PHYSICAL EXAM   The patient is oriented to person time and place. Affect is normal. Lungs are clear. Respiratory effort is not labored. Cardiac exam reveals S1 and S2. The site of his cath at the right radial artery is stable. He has no peripheral edema.   TELEMETRY: I have reviewed telemetry today November 04, 2014. There is normal sinus rhythm. He had very limited 2-1 AV block during the night while resting.   ASSESSMENT AND PLAN:  Coronary artery disease of the native coronary arteries   The patient had PCI to the right coronary artery yesterday. He will require dual antiplatelets therapy. His LVEDP was elevated. The dose of his combination medicine with an ARB and diuretics was increased. He is stable for discharge home.  2:1 AV block   Patient had limited to one AV block during the  evening while resting. He has no symptoms. He may have sleep apnea. Recommendation would be for further evaluation to rule out sleep apnea.   The patient is stable and ready for discharge home.     Dola Argyle 11/04/2014 9:03 AM

## 2014-11-04 NOTE — Discharge Summary (Signed)
Discharge Summary   Patient ID: COEN MIYASATO,  MRN: [id number], DOB/AGE: 06/17/1950 64 y.o.  Admit date: 11/03/2014 Discharge date: 11/04/2014  Primary Care Provider: Scarlette Calico Primary Cardiologist: Dr. Harrington Challenger  Discharge Diagnoses Principal Problem:   Abnormal nuclear stress test Active Problems:   TOBACCO ABUSE   Essential hypertension   Chronic kidney disease   SOB (shortness of breath)   Snoring   Hyperlipidemia with target LDL less than 100   Effort angina   Heart block AV second degree   Allergies No Known Allergies  Procedures  Cardiac catheterization 11/04/2014 Conclusion     Mid RCA lesion, 30% stenosed.  Mid Cx to Dist Cx lesion, 40% stenosed.  Prox RCA-1 lesion, 30% stenosed.  Prox RCA-2 lesion, 90% stenosed. There is a 0% residual stenosis post intervention.  A drug-eluting stent was placed.  1. Severe one-vessel coronary artery disease involving the proximal right coronary artery. Mild to Moderate mid left circumflex disease. 2. Normal LV systolic function by echocardiogram. Left ventricular angiography was not performed. 3. Moderately elevated left ventricular end-diastolic pressure likely to due to diastolic dysfunction and possible underlying sleep apnea. 4. Successful angioplasty and drug-eluting stent placement to the proximal right coronary artery.  Recommendations: Dual antiplatelets therapy for at least one year. Aggressive control of risk factors is recommended. I doubled the dose of Diovan-hydrochlorothiazide due to elevated blood pressure. Considers screening for sleep apnea if not already done.       Hospital Course  64 yo male with PMH of HTN and HLD was seen by Dr. Harrington Challenger in the office on 10/08/2014, at which time she complained of dyspnea which is new. Outpatient echocardiogram and Myoview were set up. Echo obtained on 10/22/2014 showed EF 60-65%, mild LVH, grade 2 diastolic dysfunction, akinesis of apical myocardium. Lexiscan Myoview  obtained on the same day showed EF 46%, medium defect of moderate severity and a partially reversible inferolateral defect. Deep T wave inversion noted inferolaterally at rest. Given abnormal Myoview, he was scheduled for outpatient cardiac catheterization.  He underwent outpatient cardiac cath on 11/03/2014, which showed 30% mid RCA lesion, 40% mid left circumflex lesion, 30% proximal RCA lesion followed by 90% stenosis which was treated with drug-eluting stent. Overall, he had severe one vessel coronary artery disease involving proximal RCA. He was placed on plavix along with ASA.  He was seen in the morning of 11/04/2014, his renal function appears to be stable at this time. Overnight, he did have very limited 2:1 AV block while he was sleeping. Sleep apnea maybe need to be ruled out as outpatient. Otherwise, he is doing well without further discomfort. He is deemed stable for discharge from cardiology perspective. Repeat emphasis has been placed on compliance with aspirin and Plavix given the stent. I have arranged 2-4 weeks follow-up with Dr. Alan Ripper clinic.   Of note, per cardiac rehabilitation note, patient does report recent drug use however he did not specify the type of drug. I have discussed with the patient and conseled him on tobacco cessation and avoidance of substance abuse given his coronary artery history. He states he could not take cholesterol medication because it made him pass out in the past, however upon further discussion with the patient, he may have had this confused with another blood pressure medication in the past. He is on cholesterol medication and does not seems have significant side effect from it. This should be further reassessed in the future.   Discharge Vitals Blood pressure 158/76, pulse 62, temperature  20 F (36.7 C), temperature source Oral, resp. rate 18, height 5\' 11"  (1.803 m), weight 279 lb 12.2 oz (126.9 kg), SpO2 97 %.  Filed Weights   11/03/14 1033 11/04/14  0000  Weight: 273 lb (123.832 kg) 279 lb 12.2 oz (126.9 kg)    Labs  CBC  Recent Labs  11/02/14 1054 11/03/14 1944 11/04/14 0252  WBC 6.2 5.8 5.8  NEUTROABS 2.8  --   --   HGB 14.5 13.8 13.4  HCT 43.3 41.0 40.4  MCV 96.0 96.0 96.9  PLT 462.0* 397 633   Basic Metabolic Panel  Recent Labs  11/02/14 1054 11/03/14 1146 11/03/14 1944 11/04/14 0252  NA 144  --   --  143  K 3.2* 3.3*  --  3.4*  CL 106  --   --  110  CO2 29  --   --  27  GLUCOSE 173*  --   --  106*  BUN 17  --   --  14  CREATININE 1.50  --  1.47* 1.34*  CALCIUM 9.3  --   --  8.6*    Disposition  Pt is being discharged home today in good condition.  Follow-up Plans & Appointments      Follow-up Information    Follow up with Melina Copa, PA-C On 11/25/2014.   Specialties:  Cardiology, Radiology   Why:  2:00pm   Contact information:   [address] [place] [address] [phone]       Discharge Medications    Medication List    TAKE these medications        aspirin 81 MG tablet  Take 81 mg by mouth daily.     atorvastatin 40 MG tablet  Commonly known as:  LIPITOR  Take 1 tablet (40 mg total) by mouth daily.     clopidogrel 75 MG tablet  Commonly known as:  PLAVIX  Take 1 tablet (75 mg total) by mouth daily with breakfast.     Fish Oil 1000 MG Caps  Take 1,000 mg by mouth daily.     Potassium Chloride ER 20 MEQ Tbcr  Take two 20 mEq tablets today one time only; then one 20 mEq tablet by mouth once daily.     valsartan-hydrochlorothiazide 160-12.5 MG per tablet  Commonly known as:  DIOVAN HCT  Take 1 tablet by mouth daily.        Outstanding Labs/Studies  Possible outpatient sleep study  Duration of Discharge Encounter   Greater than 30 minutes including physician time.  Hilbert Corrigan PA-C Pager: [phone] 11/04/2014, 10:41 AM Patient seen and examined. I agree with the assessment and plan as detailed above. See also my additional thoughts  below.   Refer also to my progress note from today. I have made the decision for discharge. The patient is stable. I agree with the note above in the plans as outlined.  Dola Argyle, MD, [facility] 11/04/2014 11:24 AM

## 2014-11-04 NOTE — Progress Notes (Signed)
Patient with 2.93 second pause on tele monitor as well as new type II 2nd degree HB noted, patient sleeping soundly with no cardiac complaints when checked, BP 122/51, HR 59, patient with no complaints of DOE or SOB, on call card fellow Dr. Candyce Churn paged, awaiting call back at this time, will continue to monitor closely.

## 2014-11-24 ENCOUNTER — Telehealth: Payer: Self-pay | Admitting: *Deleted

## 2014-11-24 NOTE — Telephone Encounter (Signed)
NEED FM HX & STATUS, NO ANSWER

## 2014-11-25 ENCOUNTER — Encounter: Payer: 59 | Admitting: Physician Assistant

## 2014-12-03 ENCOUNTER — Ambulatory Visit (INDEPENDENT_AMBULATORY_CARE_PROVIDER_SITE_OTHER): Payer: 59 | Admitting: Nurse Practitioner

## 2014-12-03 ENCOUNTER — Encounter: Payer: Self-pay | Admitting: Nurse Practitioner

## 2014-12-03 VITALS — BP 138/88 | HR 69 | Ht 71.0 in | Wt 278.0 lb

## 2014-12-03 DIAGNOSIS — I2510 Atherosclerotic heart disease of native coronary artery without angina pectoris: Secondary | ICD-10-CM

## 2014-12-03 DIAGNOSIS — Z720 Tobacco use: Secondary | ICD-10-CM | POA: Insufficient documentation

## 2014-12-03 DIAGNOSIS — I459 Conduction disorder, unspecified: Secondary | ICD-10-CM

## 2014-12-03 DIAGNOSIS — R0683 Snoring: Secondary | ICD-10-CM

## 2014-12-03 DIAGNOSIS — I10 Essential (primary) hypertension: Secondary | ICD-10-CM

## 2014-12-03 DIAGNOSIS — E785 Hyperlipidemia, unspecified: Secondary | ICD-10-CM | POA: Diagnosis not present

## 2014-12-03 NOTE — Patient Instructions (Signed)
Medication Instructions:  Your physician recommends that you continue on your current medications as directed. Please refer to the Current Medication list given to you today.   Labwork: NONE  Testing/Procedures: Your physician has recommended that you have a sleep study. This test records several body functions during sleep, including: brain activity, eye movement, oxygen and carbon dioxide blood levels, heart rate and rhythm, breathing rate and rhythm, the flow of air through your mouth and nose, snoring, body muscle movements, and chest and belly movement.   Follow-Up: 3 MONTHS WITH DR. ROSS  Any Other Special Instructions Will Be Listed Below (If Applicable).
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
## 2015-01-17 ENCOUNTER — Encounter: Payer: Self-pay | Admitting: Internal Medicine

## 2015-01-17 ENCOUNTER — Ambulatory Visit (INDEPENDENT_AMBULATORY_CARE_PROVIDER_SITE_OTHER): Payer: 59 | Admitting: Internal Medicine

## 2015-01-17 ENCOUNTER — Other Ambulatory Visit (INDEPENDENT_AMBULATORY_CARE_PROVIDER_SITE_OTHER): Payer: 59

## 2015-01-17 VITALS — BP 118/70 | HR 68 | Temp 97.9°F | Resp 16 | Ht 71.0 in | Wt 270.0 lb

## 2015-01-17 DIAGNOSIS — R748 Abnormal levels of other serum enzymes: Secondary | ICD-10-CM | POA: Diagnosis not present

## 2015-01-17 DIAGNOSIS — I2510 Atherosclerotic heart disease of native coronary artery without angina pectoris: Secondary | ICD-10-CM | POA: Diagnosis not present

## 2015-01-17 DIAGNOSIS — D759 Disease of blood and blood-forming organs, unspecified: Secondary | ICD-10-CM

## 2015-01-17 DIAGNOSIS — R0602 Shortness of breath: Secondary | ICD-10-CM

## 2015-01-17 DIAGNOSIS — N181 Chronic kidney disease, stage 1: Secondary | ICD-10-CM | POA: Diagnosis not present

## 2015-01-17 DIAGNOSIS — R05 Cough: Secondary | ICD-10-CM

## 2015-01-17 DIAGNOSIS — R739 Hyperglycemia, unspecified: Secondary | ICD-10-CM | POA: Diagnosis not present

## 2015-01-17 DIAGNOSIS — N5089 Other specified disorders of the male genital organs: Secondary | ICD-10-CM

## 2015-01-17 DIAGNOSIS — R059 Cough, unspecified: Secondary | ICD-10-CM

## 2015-01-17 DIAGNOSIS — E785 Hyperlipidemia, unspecified: Secondary | ICD-10-CM

## 2015-01-17 DIAGNOSIS — I10 Essential (primary) hypertension: Secondary | ICD-10-CM | POA: Diagnosis not present

## 2015-01-17 DIAGNOSIS — R7303 Prediabetes: Secondary | ICD-10-CM | POA: Insufficient documentation

## 2015-01-17 DIAGNOSIS — N509 Disorder of male genital organs, unspecified: Secondary | ICD-10-CM

## 2015-01-17 LAB — LIPID PANEL
CHOL/HDL RATIO: 3
Cholesterol: 135 mg/dL (ref 0–200)
HDL: 43.7 mg/dL (ref >39.00)
LDL Cholesterol: 68 mg/dL (ref 0–99)
NONHDL: 91.19
Triglycerides: 117 mg/dL (ref 0.0–149.0)
VLDL: 23.4 mg/dL (ref 0.0–40.0)

## 2015-01-17 LAB — BASIC METABOLIC PANEL
BUN: 19 mg/dL (ref 6–23)
CALCIUM: 9.1 mg/dL (ref 8.4–10.5)
CHLORIDE: 109 meq/L (ref 96–112)
CO2: 29 mEq/L (ref 19–32)
CREATININE: 1.44 mg/dL (ref 0.40–1.50)
GFR: 63.48 mL/min (ref >60.00)
Glucose, Bld: 102 mg/dL — ABNORMAL HIGH (ref 70–99)
Potassium: 3.8 mEq/L (ref 3.5–5.1)
Sodium: 144 mEq/L (ref 135–145)

## 2015-01-17 LAB — HEMOGLOBIN A1C: HEMOGLOBIN A1C: 6 % (ref 4.6–6.5)

## 2015-01-17 LAB — CK: CK TOTAL: 437 U/L — AB (ref 7–232)

## 2015-01-17 MED ORDER — CLOPIDOGREL BISULFATE 75 MG PO TABS: 75.0000 mg | ORAL_TABLET | Freq: Every day | ORAL | Status: DC

## 2015-01-17 MED ORDER — POTASSIUM CHLORIDE ER 20 MEQ PO TBCR: EXTENDED_RELEASE_TABLET | ORAL | Status: DC

## 2015-01-17 NOTE — Progress Notes (Signed)
Subjective:  Patient ID: James Sawyer, male    DOB: 24-Jul-1950  Age: 64 y.o. MRN: [id number]  CC: Hyperlipidemia; Hypertension; and Coronary Artery Disease   HPI James Sawyer presents for f/up, he has had a cardiac stent placed since I last saw him. He has not had any CP, SOB, DOE. He is tolerating the statin well.  Outpatient Prescriptions Prior to Visit  Medication Sig Dispense Refill  . aspirin 81 MG tablet Take 81 mg by mouth daily.    Marland Kitchen atorvastatin (LIPITOR) 40 MG tablet Take 1 tablet (40 mg total) by mouth daily. 90 tablet 3  . Omega-3 Fatty Acids (FISH OIL) 1000 MG CAPS Take 1,000 mg by mouth daily.     . valsartan-hydrochlorothiazide (DIOVAN HCT) 160-12.5 MG per tablet Take 1 tablet by mouth daily. 30 tablet 11  . clopidogrel (PLAVIX) 75 MG tablet Take 1 tablet (75 mg total) by mouth daily with breakfast. 90 tablet 3  . potassium chloride 20 MEQ TBCR Take two 20 mEq tablets today one time only; then one 20 mEq tablet by mouth once daily. 30 tablet 3   No facility-administered medications prior to visit.    ROS Review of Systems  Constitutional: Negative.  Negative for fever, chills, diaphoresis, appetite change and fatigue.  HENT: Negative.   Eyes: Negative.   Respiratory: Positive for apnea. Negative for cough, choking, chest tightness, shortness of breath, wheezing and stridor.   Cardiovascular: Negative.  Negative for chest pain, palpitations and leg swelling.  Gastrointestinal: Negative.  Negative for nausea, vomiting, abdominal pain, diarrhea, constipation and blood in stool.  Endocrine: Negative.   Genitourinary: Negative.  Negative for dysuria and difficulty urinating.  Musculoskeletal: Negative.  Negative for myalgias, back pain, arthralgias, neck pain and neck stiffness.  Skin: Negative.  Negative for rash.  Allergic/Immunologic: Negative.   Neurological: Negative.  Negative for dizziness, tremors, weakness, light-headedness, numbness and headaches.    Hematological: Negative.  Negative for adenopathy. Does not bruise/bleed easily.  Psychiatric/Behavioral: Negative.     Objective:  BP 118/70 mmHg  Pulse 68  Temp(Src) 97.9 F (36.6 C) (Oral)  Resp 16  Ht 5\' 11"  (1.803 m)  Wt 270 lb (122.471 kg)  BMI 37.67 kg/m2  SpO2 95%  BP Readings from Last 3 Encounters:  01/17/15 118/70  12/03/14 138/88  11/04/14 158/76    Wt Readings from Last 3 Encounters:  01/17/15 270 lb (122.471 kg)  12/03/14 278 lb (126.1 kg)  11/04/14 279 lb 12.2 oz (126.9 kg)    Physical Exam  Constitutional: He is oriented to person, place, and time. No distress.  HENT:  Mouth/Throat: Oropharynx is clear and moist. No oropharyngeal exudate.  Eyes: Conjunctivae are normal. Right eye exhibits no discharge. Left eye exhibits no discharge. No scleral icterus.  Neck: Normal range of motion. Neck supple. No JVD present. No tracheal deviation present. No thyromegaly present.  Cardiovascular: Normal rate, regular rhythm, normal heart sounds and intact distal pulses.  Exam reveals no gallop and no friction rub.   No murmur heard. Pulmonary/Chest: Effort normal and breath sounds normal. No stridor. No respiratory distress. He has no wheezes. He has no rales. He exhibits no tenderness.  Abdominal: Soft. Bowel sounds are normal. He exhibits no distension and no mass. There is no tenderness. There is no rebound and no guarding.  Musculoskeletal: Normal range of motion. He exhibits no edema or tenderness.  Lymphadenopathy:    He has no cervical adenopathy.  Neurological: He is oriented to person,  place, and time.  Skin: Skin is warm and dry. No rash noted. He is not diaphoretic. No erythema. No pallor.  Vitals reviewed.   Lab Results  Component Value Date   WBC 5.8 11/04/2014   HGB 13.4 11/04/2014   HCT 40.4 11/04/2014   PLT 393 11/04/2014   GLUCOSE 106* 11/04/2014   CHOL 220* 08/26/2014   TRIG 224.0* 08/26/2014   HDL 49.60 08/26/2014   LDLDIRECT 145.0  08/26/2014   ALT 34 08/26/2014   AST 22 08/26/2014   NA 143 11/04/2014   K 3.4* 11/04/2014   CL 110 11/04/2014   CREATININE 1.34* 11/04/2014   BUN 14 11/04/2014   CO2 27 11/04/2014   TSH 2.19 08/26/2014   PSA 0.45 08/26/2014   INR 1.0 11/02/2014    No results found.  Assessment & Plan:   Shrihaan was seen today for hyperlipidemia, hypertension and coronary artery disease.  Diagnoses and all orders for this visit:  Hyperglycemia - I wil check his A1C to screen for DM2 -     Basic metabolic panel; Future -     Hemoglobin A1c; Future  Cough -     Pulmonary Function Test  SOB (shortness of breath) -     Pulmonary Function Test  Coronary artery disease involving native coronary artery of native heart without angina pectoris- I asked him to quit smoking, will recheck his LDL today and will treat to the goal of 70, he has not had any angina symptoms  Essential hypertension- his BP is well controlled  Chronic kidney disease, stage 1  Testicular mass - left- he needs to f/up with urology  Elevated CK- no s/s related to this, will recheck his CPK level since he is taking a statin  Disease of blood and blood forming organ  Hyperlipidemia with target LDL less than 100- FLP today  Other orders -     clopidogrel (PLAVIX) 75 MG tablet; Take 1 tablet (75 mg total) by mouth daily with breakfast. -     Potassium Chloride ER 20 MEQ TBCR; Take two 20 mEq tablets today one time only; then one 20 mEq tablet by mouth once daily.   I have changed Mr. Morss Potassium Chloride ER. I am also having him maintain his aspirin, Fish Oil, atorvastatin, valsartan-hydrochlorothiazide, and clopidogrel.  Meds ordered this encounter  Medications  . clopidogrel (PLAVIX) 75 MG tablet    Sig: Take 1 tablet (75 mg total) by mouth daily with breakfast.    Dispense:  90 tablet    Refill:  3  . Potassium Chloride ER 20 MEQ TBCR    Sig: Take two 20 mEq tablets today one time only; then one 20 mEq  tablet by mouth once daily.    Dispense:  30 tablet    Refill:  3     Follow-up: Return in about 4 months (around 05/20/2015).  Scarlette Calico, MD

## 2015-01-17 NOTE — Patient Instructions (Signed)
Coronary Angiogram With Stent °Coronary angiography with stent placement is a procedure to widen or open a narrow blood vessel of the heart (coronary artery). When a coronary artery becomes partially blocked, it decreases blood flow to that area. This may lead to chest pain or a heart attack (myocardial infarction). Arteries may become blocked by cholesterol buildup (plaque) in the lining or wall.  °A stent is a small piece of metal that looks like a mesh or a spring. Stent placement may be done right after a coronary angiography in which a blocked artery is found or as a treatment for a heart attack.  °LET YOUR HEALTH CARE PROVIDER KNOW ABOUT: °· Any allergies you have.   °· All medicines you are taking, including vitamins, herbs, eye drops, creams, and over-the-counter medicines.   °· Previous problems you or members of your family have had with the use of anesthetics.   °· Any blood disorders you have.   °· Previous surgeries you have had.   °· Medical conditions you have. °RISKS AND COMPLICATIONS °Generally, coronary angiography with stent is a safe procedure. However, problems can occur and include: °· Damage to the heart or its blood vessels.   °· A return of blockage.   °· Bleeding, infection, or bruising at the insertion site.   °· A collection of blood under the skin (hematoma) at the insertion site. °· Blood clot in another part of the body.   °· Kidney injury.   °· Allergic reaction to the dye or contrast used.   °· Bleeding into the abdomen (retroperitoneal bleeding). °BEFORE THE PROCEDURE °· Do not eat or drink anything after midnight on the night before the procedure or as directed by your health care provider.  °· Ask your health care provider about changing or stopping your regular medicines. This is especially important if you are taking diabetes medicines or blood thinners. °· Your health care provider will make sure you understand the procedure as well as the risks and potential problems  associated with the procedure.   °PROCEDURE °· You may be given a medicine to help you relax before and during the procedure (sedative). This medicine will be given through an IV tube that is put into one of your veins.   °· The area where the catheter will be inserted will be shaved and cleaned. This is usually done in the groin but may be done in the fold of your arm (near your elbow) or in the wrist.    °· A medicine will be given to numb the area where the catheter will be inserted (local anesthetic).   °· The catheter will be inserted into an artery using a guide wire. A type of X-ray (fluoroscopy) will be used to help guide the catheter to the opening of the blocked artery.   °· A dye will then be injected into the catheter, and X-rays will be taken. The dye will help to show where any narrowing or blockages are located in the heart arteries.   °· A tiny wire will be guided to the blocked spot, and a balloon will be inflated to make the artery wider. The stent will be expanded and will crush the plaque into the wall of the vessel. The stent will hold the area open like a scaffolding and improve the blood flow.   °· Sometimes the artery may be made wider using a laser or other tools to remove plaque.   °· When the blood flow is better, the catheter will be removed. The lining of the artery will grow over the stent, which stays where it was placed.   °  AFTER THE PROCEDURE °· If the procedure is done through the leg, you will be kept in bed lying flat for about 6 hours. You will be instructed to not bend or cross your legs.   °· The insertion site will be checked frequently.   °· The pulse in your feet or wrist will be checked frequently.   °· Additional blood tests, X-rays, and electrocardiography may be done. °  °This information is not intended to replace advice given to you by your health care provider. Make sure you discuss any questions you have with your health care provider. °  °Document Released:  09/30/2002 Document Revised: 04/16/2014 Document Reviewed: 08/18/2012 °Elsevier Interactive Patient Education ©2016 [employer] ° °

## 2015-01-17 NOTE — Progress Notes (Signed)
Pre visit review using our clinic review tool, if applicable. No additional management support is needed unless otherwise documented below in the visit note. 

## 2015-02-20 ENCOUNTER — Ambulatory Visit (HOSPITAL_BASED_OUTPATIENT_CLINIC_OR_DEPARTMENT_OTHER): Payer: 59 | Attending: Nurse Practitioner

## 2015-02-20 VITALS — Ht 71.0 in | Wt 267.0 lb

## 2015-02-20 DIAGNOSIS — R0683 Snoring: Secondary | ICD-10-CM | POA: Diagnosis not present

## 2015-02-20 DIAGNOSIS — G4737 Central sleep apnea in conditions classified elsewhere: Secondary | ICD-10-CM | POA: Diagnosis not present

## 2015-02-20 DIAGNOSIS — G4733 Obstructive sleep apnea (adult) (pediatric): Secondary | ICD-10-CM

## 2015-03-07 ENCOUNTER — Ambulatory Visit (INDEPENDENT_AMBULATORY_CARE_PROVIDER_SITE_OTHER): Payer: 59 | Admitting: Internal Medicine

## 2015-03-07 ENCOUNTER — Encounter: Payer: Self-pay | Admitting: Internal Medicine

## 2015-03-07 VITALS — BP 152/84 | HR 90 | Ht 71.0 in | Wt 274.8 lb

## 2015-03-07 DIAGNOSIS — E785 Hyperlipidemia, unspecified: Secondary | ICD-10-CM | POA: Diagnosis not present

## 2015-03-07 DIAGNOSIS — I10 Essential (primary) hypertension: Secondary | ICD-10-CM

## 2015-03-07 DIAGNOSIS — I2510 Atherosclerotic heart disease of native coronary artery without angina pectoris: Secondary | ICD-10-CM

## 2015-03-07 LAB — BASIC METABOLIC PANEL
BUN: 23 mg/dL (ref 7–25)
CALCIUM: 9.4 mg/dL (ref 8.6–10.3)
CO2: 28 mmol/L (ref 20–31)
Chloride: 109 mmol/L (ref 98–110)
Creat: 1.6 mg/dL — ABNORMAL HIGH (ref 0.70–1.25)
GLUCOSE: 105 mg/dL — AB (ref 65–99)
Potassium: 4.5 mmol/L (ref 3.5–5.3)
SODIUM: 147 mmol/L — AB (ref 135–146)

## 2015-03-07 LAB — CBC WITH DIFFERENTIAL/PLATELET
BASOS ABS: 0 10*3/uL (ref 0.0–0.1)
BASOS PCT: 0 % (ref 0–1)
EOS ABS: 0.1 10*3/uL (ref 0.0–0.7)
Eosinophils Relative: 2 % (ref 0–5)
HCT: 41.7 % (ref 39.0–52.0)
Hemoglobin: 14.3 g/dL (ref 13.0–17.0)
Lymphocytes Relative: 49 % — ABNORMAL HIGH (ref 12–46)
Lymphs Abs: 3.4 10*3/uL (ref 0.7–4.0)
MCH: 32.5 pg (ref 26.0–34.0)
MCHC: 34.3 g/dL (ref 30.0–36.0)
MCV: 94.8 fL (ref 78.0–100.0)
MPV: 9.4 fL (ref 8.6–12.4)
Monocytes Absolute: 0.6 10*3/uL (ref 0.1–1.0)
Monocytes Relative: 8 % (ref 3–12)
Neutro Abs: 2.9 10*3/uL (ref 1.7–7.7)
Neutrophils Relative %: 41 % — ABNORMAL LOW (ref 43–77)
PLATELETS: 434 10*3/uL — AB (ref 150–400)
RBC: 4.4 MIL/uL (ref 4.22–5.81)
RDW: 14.5 % (ref 11.5–15.5)
WBC: 7 10*3/uL (ref 4.0–10.5)

## 2015-03-07 MED ORDER — VALSARTAN-HYDROCHLOROTHIAZIDE 160-12.5 MG PO TABS: 1.0000 | ORAL_TABLET | Freq: Every day | ORAL | Status: DC

## 2015-03-07 MED ORDER — ATORVASTATIN CALCIUM 40 MG PO TABS: 40.0000 mg | ORAL_TABLET | Freq: Every day | ORAL | Status: DC

## 2015-03-07 MED ORDER — POTASSIUM CHLORIDE ER 20 MEQ PO TBCR: EXTENDED_RELEASE_TABLET | ORAL | Status: DC

## 2015-03-07 MED ORDER — CLOPIDOGREL BISULFATE 75 MG PO TABS: 75.0000 mg | ORAL_TABLET | Freq: Every day | ORAL | Status: DC

## 2015-03-07 NOTE — Patient Instructions (Signed)
Medication Instructions:  No changes today, refills have been sent to [facility].  Labwork: BMET/CBCd today  Testing/Procedures: None.  Follow-Up: Your physician wants you to follow-up in: July 2017 with Dr Harrington Challenger. You will receive a reminder letter in the mail two months in advance. If you don't receive a letter, please call our office to schedule the follow-up appointment.       If you need a refill on your cardiac medications before your next appointment, please call your pharmacy.
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
## 2015-03-08 ENCOUNTER — Telehealth: Payer: Self-pay

## 2015-03-08 DIAGNOSIS — I10 Essential (primary) hypertension: Secondary | ICD-10-CM

## 2015-03-08 NOTE — Telephone Encounter (Signed)
-----   Message from Fay Records, MD sent at 03/07/2015 10:19 PM EST ----- Sodium is increased  Minimally  Cr up Is he getting encough water to drink? I would repeat CBC with diff and BMET later this week.

## 2015-03-08 NOTE — Telephone Encounter (Signed)
Informed patient of results and verbal understanding expressed.  Encouraged hydration. CBC and BMET scheduled for Friday, Dec. 2. Patient agrees with treatment plan.

## 2015-03-09 ENCOUNTER — Encounter (HOSPITAL_BASED_OUTPATIENT_CLINIC_OR_DEPARTMENT_OTHER): Payer: Self-pay

## 2015-03-09 ENCOUNTER — Telehealth: Payer: Self-pay | Admitting: Cardiology

## 2015-03-09 DIAGNOSIS — G4733 Obstructive sleep apnea (adult) (pediatric): Secondary | ICD-10-CM | POA: Insufficient documentation

## 2015-03-09 HISTORY — DX: Obstructive sleep apnea (adult) (pediatric): G47.33

## 2015-03-09 NOTE — Sleep Study (Signed)
   Patient Name: Eugen, Rigler MRN: [id number] Study Date: 02/20/2015 Gender: Male D.O.B: 08-14-1950 Age (years): 38 Referring Provider: Murray Hodgkins Interpreting Physician: Fransico Him MD, ABSM RPSGT: Joni Reining  Weight (lbs): 267 Height (inches): 71 BMI: 37 Neck Size: 19.00  CLINICAL INFORMATION Sleep Study Type: NPSG Indication for sleep study: Snoring Epworth Sleepiness Score: 8  SLEEP STUDY TECHNIQUE As per the AASM Manual for the Scoring of Sleep and Associated Events v2.3 (April 2016) with a hypopnea requiring 4% desaturations. The channels recorded and monitored were frontal, central and occipital EEG, electrooculogram (EOG), submentalis EMG (chin), nasal and oral airflow, thoracic and abdominal wall motion, anterior tibialis EMG, snore microphone, electrocardiogram, and pulse oximetry.  MEDICATIONS Patient's medications include: ASA, Lipitor, Plavix, KCl, Diovan HCT. Medications self-administered by patient during sleep study : No sleep medicine administered.  SLEEP ARCHITECTURE The study was initiated at 10:58:40 PM and ended at 5:05:49 AM. Sleep onset time was 3.5 minutes and the sleep efficiency was markedly reduced at 25.2%. The total sleep time was low at 92.5 minutes. Stage REM latency was N/A minutes. The patient spent 21.62% of the night in stage N1 sleep, 78.38% in stage N2 sleep, 0.00% in stage N3 and 0.00% in REM. Alpha intrusion was absent. Supine sleep was 0.54%.  RESPIRATORY PARAMETERS The overall apnea/hypopnea index (AHI) was 106.4 per hour. There were 160 total apneas, including 55 obstructive, 105 central and 0 mixed apneas. There were 4 hypopneas and 23 RERAs. The AHI during Stage REM sleep was N/A per hour. AHI while supine was 0.0 per hour. The mean oxygen saturation was 95.71%. The minimum SpO2 during sleep was 89.00%. Moderate snoring was noted during this study.  CARDIAC DATA The 2 lead EKG demonstrated sinus rhythm. The mean  heart rate was 71.96 beats per minute. Other EKG findings include: None.  LEG MOVEMENT DATA The total PLMS were 0 with a resulting PLMS index of 0.00. Associated arousal with leg movement index was 0.0 .  IMPRESSIONS - Severe obstructive sleep apnea occurred during this study (AHI = 106.4/h). - Severe central sleep apnea occurred during this study (CAI = 68.1/h). - The patient had minimal or no oxygen desaturation during the study (Min O2 = 89.00%) - The patient snored with Moderate snoring volume. - Clinically significant periodic limb movements did not occur during sleep. No significant associated arousals.  DIAGNOSIS - Obstructive Sleep Apnea (327.23 [G47.33 ICD-10]) - Central Sleep Apnea (327.27 [G47.37 ICD-10])  RECOMMENDATIONS - CPAP titration to determine optimal pressure required to alleviate sleep disordered breathing. BiPAP or ASV titration may be required to eliminate central sleep apnea. - Avoid alcohol, sedatives and other CNS depressants that may worsen sleep apnea and disrupt normal sleep architecture. - Sleep hygiene should be reviewed to assess factors that may improve sleep quality. - Weight management and regular exercise should be initiated or continued if appropriate.   Annetta South, American Board of Sleep Medicine  ELECTRONICALLY SIGNED ON:  03/09/2015, 3:27 PM [place] PH: [phone]   FX: [phone] [place]

## 2015-03-09 NOTE — Telephone Encounter (Signed)
Please let patient know that they have sleep apnea and recommend CPAP titration. Please set up titration in the sleep lab ASAP due to severity

## 2015-03-10 NOTE — Telephone Encounter (Signed)
Left message for patient to call back  

## 2015-03-10 NOTE — Addendum Note (Signed)
Addended by: Eulis Foster on: 03/10/2015 05:05 PM   Modules accepted: Orders

## 2015-03-11 ENCOUNTER — Other Ambulatory Visit (INDEPENDENT_AMBULATORY_CARE_PROVIDER_SITE_OTHER): Payer: 59 | Admitting: *Deleted

## 2015-03-11 DIAGNOSIS — I10 Essential (primary) hypertension: Secondary | ICD-10-CM

## 2015-03-11 LAB — CBC WITH DIFFERENTIAL/PLATELET
BASOS PCT: 0 % (ref 0–1)
Basophils Absolute: 0 10*3/uL (ref 0.0–0.1)
EOS ABS: 0.1 10*3/uL (ref 0.0–0.7)
EOS PCT: 2 % (ref 0–5)
HCT: 41 % (ref 39.0–52.0)
Hemoglobin: 13.7 g/dL (ref 13.0–17.0)
Lymphocytes Relative: 49 % — ABNORMAL HIGH (ref 12–46)
Lymphs Abs: 3 10*3/uL (ref 0.7–4.0)
MCH: 31.6 pg (ref 26.0–34.0)
MCHC: 33.4 g/dL (ref 30.0–36.0)
MCV: 94.5 fL (ref 78.0–100.0)
MONO ABS: 0.5 10*3/uL (ref 0.1–1.0)
MPV: 9.3 fL (ref 8.6–12.4)
Monocytes Relative: 8 % (ref 3–12)
NEUTROS ABS: 2.5 10*3/uL (ref 1.7–7.7)
Neutrophils Relative %: 41 % — ABNORMAL LOW (ref 43–77)
PLATELETS: 435 10*3/uL — AB (ref 150–400)
RBC: 4.34 MIL/uL (ref 4.22–5.81)
RDW: 14.5 % (ref 11.5–15.5)
WBC: 6.2 10*3/uL (ref 4.0–10.5)

## 2015-03-11 LAB — BASIC METABOLIC PANEL
BUN: 14 mg/dL (ref 7–25)
CALCIUM: 9.1 mg/dL (ref 8.6–10.3)
CHLORIDE: 107 mmol/L (ref 98–110)
CO2: 26 mmol/L (ref 20–31)
Creat: 1.4 mg/dL — ABNORMAL HIGH (ref 0.70–1.25)
Glucose, Bld: 102 mg/dL — ABNORMAL HIGH (ref 65–99)
POTASSIUM: 3.7 mmol/L (ref 3.5–5.3)
Sodium: 143 mmol/L (ref 135–146)

## 2015-03-11 NOTE — Telephone Encounter (Signed)
Left message for patient to call back  

## 2015-03-16 ENCOUNTER — Encounter: Payer: Self-pay | Admitting: *Deleted

## 2015-03-16 NOTE — Telephone Encounter (Signed)
Left message for patient to call back  

## 2015-03-16 NOTE — Telephone Encounter (Signed)
Letter sent to patient to call regarding results

## 2015-03-21 NOTE — Telephone Encounter (Signed)
Patient has been informed of sleep study results and the severity.  He is not sure if he wants to do CPAP Therapy at this time.  I have let him know the benefits of using the CPAP regarding how severe it is.  He stated verbal understanding.  He said his wife may call with further questions and it is okay for me to discuss this with her.  He said they will discuss it together and he will get back with me on an answer with what he wants to do.

## 2015-04-26 NOTE — Telephone Encounter (Signed)
I have left message for patient to call me back regarding his decision on CPAP Therapy.

## 2015-05-03 NOTE — Telephone Encounter (Signed)
Left patient a message to please call and let me know his decision on CPAP

## 2015-06-19 ENCOUNTER — Other Ambulatory Visit: Payer: Self-pay | Admitting: Internal Medicine

## 2015-06-21 ENCOUNTER — Telehealth: Payer: Self-pay

## 2015-06-21 NOTE — Telephone Encounter (Signed)
LVM for pt to call back as soon as possible.   RE: Flu Vaccine 2016-2017

## 2015-07-15 ENCOUNTER — Ambulatory Visit: Payer: Self-pay | Admitting: Surgery

## 2015-07-15 NOTE — H&P (Signed)
History of Present Illness Imogene Burn. Jahred Tatar MD; 07/15/2015 10:49 AM) The patient is a 65 year old male who presents with a complaint of Mass. Referred by Dr. Scarlette Calico for right thigh mass  This is a 65 year old male who was previously seen for a scrotal mass who presents with about a year of enlarging right thigh mass. This protrudes and is uncomfortable. It is enlarging noticeably. He would like to have it removed. Last year the patient had a cardiac catheter with placement of a stent. He is on Plavix and aspirin. His cardiologist is Dr. Dorris Carnes.    Other Problems Elbert Ewings, CMA; 07/15/2015 10:28 AM) High blood pressure Other disease, cancer, significant illness Sleep Apnea  Diagnostic Studies History Elbert Ewings, CMA; 07/15/2015 10:28 AM) Colonoscopy 5-10 years ago  Allergies Elbert Ewings, CMA; 07/15/2015 10:28 AM) No Known Drug Allergies 07/15/2015  Medication History Elbert Ewings, CMA; 07/15/2015 10:29 AM) Atorvastatin Calcium (40MG  Tablet, Oral) Active. Clopidogrel Bisulfate (75MG  Tablet, Oral) Active. Potassium Chloride ER (20MEQ Tablet ER, Oral) Active. Valsartan-Hydrochlorothiazide (160-12.5MG  Tablet, Oral) Active. Aspirin (81MG  Tablet, Oral) Active. Fish Oil (1000MG  Capsule, Oral) Active. Medications Reconciled  Social History Elbert Ewings, [place]; 07/15/2015 10:28 AM) Caffeine use Carbonated beverages, Coffee, Tea. Illicit drug use Uses weekly. No alcohol use Tobacco use Current every day smoker.  Family History Elbert Ewings, [place]; 07/15/2015 10:28 AM) Family history unknown First Degree Relatives     Review of Systems Elbert Ewings CMA; 07/15/2015 10:28 AM) General Not Present- Appetite Loss, Chills, Fatigue, Fever, Night Sweats, Weight Gain and Weight Loss. Skin Present- Dryness. Not Present- Change in Wart/Mole, Hives, Jaundice, New Lesions, Non-Healing Wounds, Rash and Ulcer. HEENT Present- Wears glasses/contact lenses. Not Present- Earache,  Hearing Loss, Hoarseness, Nose Bleed, Oral Ulcers, Ringing in the Ears, Seasonal Allergies, Sinus Pain, Sore Throat, Visual Disturbances and Yellow Eyes. Respiratory Present- Snoring. Not Present- Bloody sputum, Chronic Cough, Difficulty Breathing and Wheezing. Cardiovascular Present- Difficulty Breathing Lying Down. Not Present- Chest Pain, Leg Cramps, Palpitations, Rapid Heart Rate, Shortness of Breath and Swelling of Extremities. Gastrointestinal Not Present- Abdominal Pain, Bloating, Bloody Stool, Change in Bowel Habits, Chronic diarrhea, Constipation, Difficulty Swallowing, Excessive gas, Gets full quickly at meals, Hemorrhoids, Indigestion, Nausea, Rectal Pain and Vomiting. Male Genitourinary Present- Change in Urinary Stream, Impotence and Urine Leakage. Not Present- Blood in Urine, Frequency, Nocturia, Painful Urination and Urgency. Musculoskeletal Present- Joint Pain. Not Present- Back Pain, Joint Stiffness, Muscle Pain, Muscle Weakness and Swelling of Extremities. Neurological Not Present- Decreased Memory, Fainting, Headaches, Numbness, Seizures, Tingling, Tremor, Trouble walking and Weakness. Psychiatric Not Present- Anxiety, Bipolar, Change in Sleep Pattern, Depression, Fearful and Frequent crying. Endocrine Not Present- Cold Intolerance, Excessive Hunger, Hair Changes, Heat Intolerance, Hot flashes and New Diabetes. Hematology Not Present- Easy Bruising, Excessive bleeding, Gland problems, HIV and Persistent Infections.  Vitals Elbert Ewings CMA; 07/15/2015 10:29 AM) 07/15/2015 10:29 AM Weight: 274 lb Height: 70in Body Surface Area: 2.39 m Body Mass Index: 39.31 kg/m  Temp.: 97.25F  Pulse: 69 (Regular)  BP: 140/82 (Sitting, Left Arm, Standard)      Physical Exam Rodman Key K. Brittiany Wiehe MD; 07/15/2015 10:49 AM)  The physical exam findings are as follows: Note:WDWN in NAD Proximal medial right thigh - pedunculated 3 cm soft tissue mass; no sign of infection; no  drainage    Assessment & Plan Rodman Key K. 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 The surgical procedure has been discussed with the patient. Potential risks, benefits, alternative treatments, and expected outcomes have been explained. All of the patient's questions at this time have been answered. The likelihood of reaching the patient's treatment goal is good. The patient understand the proposed surgical procedure and wishes to proceed.  Cardiac clearance by Dr. Harrington Challenger first. Will need to hold Plavix, ASA for 3 days prior to surgery.  Imogene Burn. Georgette Dover, MD, [facility]  General/ Trauma Surgery  07/15/2015 10:51 AM

## 2015-07-19 ENCOUNTER — Telehealth: Payer: Self-pay | Admitting: *Deleted

## 2015-07-19 NOTE — Telephone Encounter (Signed)
Multiple surgical clearance papers sent by Surgery Scheduler Amy Eversole for Dr Harrington Challenger to review and advise on.  Amy is the surgery scheduler at [facility].  The pt will be scheduled for surgery, based on clearance per Dr Harrington Challenger.  Pt will be having surgery for excision of right thigh mass, and will undergo general anesthesia.  Dr Harrington Challenger will need to clear the pt from a cardiac perspective and from Plavix preoperatively. Informed Amy that Dr Harrington Challenger and nurse are both out of the office this week, but I will route this message to both of them for further review and recommendation on surgical clearance needed, and follow-up with a fax to [facility] at [phone] Telephone-[phone] Attention Amy Eversole Surgery Scheduler.  Hard copy of clearance needed is in Dr Alan Ripper mailbox to.

## 2015-07-27 ENCOUNTER — Encounter: Payer: Self-pay | Admitting: Internal Medicine

## 2015-07-27 ENCOUNTER — Other Ambulatory Visit (INDEPENDENT_AMBULATORY_CARE_PROVIDER_SITE_OTHER): Payer: 59

## 2015-07-27 ENCOUNTER — Ambulatory Visit (INDEPENDENT_AMBULATORY_CARE_PROVIDER_SITE_OTHER): Payer: 59 | Admitting: Internal Medicine

## 2015-07-27 VITALS — BP 120/88 | HR 68 | Temp 98.1°F | Resp 16 | Ht 71.0 in | Wt 272.0 lb

## 2015-07-27 DIAGNOSIS — I10 Essential (primary) hypertension: Secondary | ICD-10-CM

## 2015-07-27 DIAGNOSIS — N138 Other obstructive and reflux uropathy: Secondary | ICD-10-CM

## 2015-07-27 DIAGNOSIS — F32A Depression, unspecified: Secondary | ICD-10-CM

## 2015-07-27 DIAGNOSIS — F329 Major depressive disorder, single episode, unspecified: Secondary | ICD-10-CM | POA: Diagnosis not present

## 2015-07-27 DIAGNOSIS — G4733 Obstructive sleep apnea (adult) (pediatric): Secondary | ICD-10-CM | POA: Diagnosis not present

## 2015-07-27 DIAGNOSIS — N401 Enlarged prostate with lower urinary tract symptoms: Secondary | ICD-10-CM | POA: Diagnosis not present

## 2015-07-27 DIAGNOSIS — R351 Nocturia: Secondary | ICD-10-CM

## 2015-07-27 LAB — BASIC METABOLIC PANEL
BUN: 12 mg/dL (ref 6–23)
CHLORIDE: 106 meq/L (ref 96–112)
CO2: 32 meq/L (ref 19–32)
CREATININE: 1.4 mg/dL (ref 0.40–1.50)
Calcium: 9.2 mg/dL (ref 8.4–10.5)
GFR: 65.47 mL/min (ref >60.00)
GLUCOSE: 110 mg/dL — AB (ref 70–99)
Potassium: 4.2 mEq/L (ref 3.5–5.1)
Sodium: 144 mEq/L (ref 135–145)

## 2015-07-27 MED ORDER — TADALAFIL 5 MG PO TABS: 5.0000 mg | ORAL_TABLET | Freq: Every day | ORAL | Status: DC | PRN

## 2015-07-27 MED ORDER — BUPROPION HCL ER (XL) 150 MG PO TB24: 150.0000 mg | ORAL_TABLET | Freq: Every day | ORAL | Status: DC

## 2015-07-27 MED ORDER — DULOXETINE HCL 30 MG PO CPEP: 30.0000 mg | ORAL_CAPSULE | Freq: Every day | ORAL | Status: DC

## 2015-07-27 NOTE — Progress Notes (Signed)
Pre visit review using our clinic review tool, if applicable. No additional management support is needed unless otherwise documented below in the visit note. 

## 2015-07-27 NOTE — Patient Instructions (Signed)
Major Depressive Disorder Major depressive disorder is a mental illness. It also may be called clinical depression or unipolar depression. Major depressive disorder usually causes feelings of sadness, hopelessness, or helplessness. Some people with this disorder do not feel particularly sad but lose interest in doing things they used to enjoy (anhedonia). Major depressive disorder also can cause physical symptoms. It can interfere with work, school, relationships, and other normal everyday activities. The disorder varies in severity but is longer lasting and more serious than the sadness we all feel from time to time in our lives. Major depressive disorder often is triggered by stressful life events or major life changes. Examples of these triggers include divorce, loss of your job or home, a move, and the death of a family member or close friend. Sometimes this disorder occurs for no obvious reason at all. People who have family members with major depressive disorder or bipolar disorder are at higher risk for developing this disorder, with or without life stressors. Major depressive disorder can occur at any age. It may occur just once in your life (single episode major depressive disorder). It may occur multiple times (recurrent major depressive disorder). SYMPTOMS People with major depressive disorder have either anhedonia or depressed mood on nearly a daily basis for at least 2 weeks or longer. Symptoms of depressed mood include:  Feelings of sadness (blue or down in the dumps) or emptiness.  Feelings of hopelessness or helplessness.  Tearfulness or episodes of crying (may be observed by others).  Irritability (children and adolescents). In addition to depressed mood or anhedonia or both, people with this disorder have at least four of the following symptoms:  Difficulty sleeping or sleeping too much.   Significant change (increase or decrease) in appetite or weight.   Lack of energy or  motivation.  Feelings of guilt and worthlessness.   Difficulty concentrating, remembering, or making decisions.  Unusually slow movement (psychomotor retardation) or restlessness (as observed by others).   Recurrent wishes for death, recurrent thoughts of self-harm (suicide), or a suicide attempt. People with major depressive disorder commonly have persistent negative thoughts about themselves, other people, and the world. People with severe major depressive disorder may experiencedistorted beliefs or perceptions about the world (psychotic delusions). They also may see or hear things that are not real (psychotic hallucinations). DIAGNOSIS Major depressive disorder is diagnosed through an assessment by your health care provider. Your health care provider will ask aboutaspects of your daily life, such as mood,sleep, and appetite, to see if you have the diagnostic symptoms of major depressive disorder. Your health care provider may ask about your medical history and use of alcohol or drugs, including prescription medicines. Your health care provider also may do a physical exam and blood work. This is because certain medical conditions and the use of certain substances can cause major depressive disorder-like symptoms (secondary depression). Your health care provider also may refer you to a mental health specialist for further evaluation and treatment. TREATMENT It is important to recognize the symptoms of major depressive disorder and seek treatment. The following treatments can be prescribed for this disorder:   Medicine. Antidepressant medicines usually are prescribed. Antidepressant medicines are thought to correct chemical imbalances in the brain that are commonly associated with major depressive disorder. Other types of medicine may be added if the symptoms do not respond to antidepressant medicines alone or if psychotic delusions or hallucinations occur.  Talk therapy. Talk therapy can be  helpful in treating major depressive disorder by providing   support, education, and guidance. Certain types of talk therapy also can help with negative thinking (cognitive behavioral therapy) and with relationship issues that trigger this disorder (interpersonal therapy). A mental health specialist can help determine which treatment is best for you. Most people with major depressive disorder do well with a combination of medicine and talk therapy. Treatments involving electrical stimulation of the brain can be used in situations with extremely severe symptoms or when medicine and talk therapy do not work over time. These treatments include electroconvulsive therapy, transcranial magnetic stimulation, and vagal nerve stimulation.   This information is not intended to replace advice given to you by your health care provider. Make sure you discuss any questions you have with your health care provider.   Document Released: 07/21/2012 Document Revised: 04/16/2014 Document Reviewed: 07/21/2012 Elsevier Interactive Patient Education 2016 Elsevier Inc.  

## 2015-07-27 NOTE — Progress Notes (Signed)
Subjective:  Patient ID: James Sawyer, male    DOB: [birth year]  Age: 65 y.o. MRN: [id number]  CC: Depression   HPI James Sawyer presents for concerns about depression. His wife does most of the talking and tells me that he is angry, irritable, isolating, staying awake at night and sleeping during the day. The patient does agree to being angry, sad, and irritable and tells me he just wants to be left alone. He denies suicidal or homicidal ideations but he does endorse helplessness and hopelessness.Marland Kitchen He tells me the symptoms started about 6 months ago after he had a heart attack. He has never been treated for depression. He has sleep apnea that is not being treated. He complains of fatigue but is maintained a good appetite and his had no significant changes in his weight..  Outpatient Prescriptions Prior to Visit  Medication Sig Dispense Refill  . aspirin 81 MG tablet Take 81 mg by mouth daily.    Marland Kitchen atorvastatin (LIPITOR) 40 MG tablet Take 1 tablet (40 mg total) by mouth daily. 90 tablet 3  . clopidogrel (PLAVIX) 75 MG tablet Take 1 tablet (75 mg total) by mouth daily with breakfast. 90 tablet 3  . Omega-3 Fatty Acids (FISH OIL) 1000 MG CAPS Take 1,000 mg by mouth daily.     . Potassium Chloride ER 20 MEQ TBCR Take 1 tablet by mouth once daily. 90 tablet 3  . valsartan-hydrochlorothiazide (DIOVAN-HCT) 160-12.5 MG tablet TAKE 1 TABLET BY MOUTH EVERY DAY 30 tablet 6   No facility-administered medications prior to visit.    ROS Review of Systems  Constitutional: Positive for fatigue. Negative for fever, chills, diaphoresis, appetite change and unexpected weight change.  HENT: Negative.   Eyes: Negative.   Respiratory: Positive for apnea. Negative for cough, choking, chest tightness, shortness of breath and stridor.   Cardiovascular: Negative.  Negative for chest pain, palpitations and leg swelling.  Gastrointestinal: Negative.  Negative for nausea, vomiting, abdominal pain, diarrhea and  constipation.  Endocrine: Negative.   Genitourinary: Positive for difficulty urinating. Negative for dysuria, urgency and hematuria.       He complains of weak urine stream and getting up at night to urinate.  Musculoskeletal: Negative.   Skin: Negative.  Negative for color change and rash.  Allergic/Immunologic: Negative.   Neurological: Negative.  Negative for dizziness, weakness, light-headedness and headaches.  Hematological: Negative.  Negative for adenopathy. Does not bruise/bleed easily.  Psychiatric/Behavioral: Positive for sleep disturbance, dysphoric mood and decreased concentration. Negative for suicidal ideas and self-injury. The patient is not nervous/anxious.     Objective:  BP 120/88 mmHg  Pulse 68  Temp(Src) 98.1 F (36.7 C) (Oral)  Resp 16  Ht 5\' 11"  (1.803 m)  Wt 272 lb (123.378 kg)  BMI 37.95 kg/m2  SpO2 97%  BP Readings from Last 3 Encounters:  07/27/15 120/88  03/07/15 152/84  01/17/15 118/70    Wt Readings from Last 3 Encounters:  07/27/15 272 lb (123.378 kg)  03/07/15 274 lb 12.8 oz (124.648 kg)  02/21/15 267 lb (121.11 kg)    Physical Exam  Constitutional: He is oriented to person, place, and time. He appears well-developed and well-nourished. No distress.  HENT:  Head: Normocephalic and atraumatic.  Mouth/Throat: Oropharynx is clear and moist. No oropharyngeal exudate.  Eyes: Conjunctivae are normal. Right eye exhibits no discharge. Left eye exhibits no discharge. No scleral icterus.  Neck: Normal range of motion. Neck supple. No JVD present. No tracheal deviation present. No  thyromegaly present.  Cardiovascular: Normal rate, regular rhythm, normal heart sounds and intact distal pulses.  Exam reveals no gallop and no friction rub.   No murmur heard. Pulmonary/Chest: Effort normal and breath sounds normal. No stridor. No respiratory distress. He has no wheezes. He has no rales. He exhibits no tenderness.  Abdominal: Soft. Bowel sounds are normal.  He exhibits no distension and no mass. There is no tenderness. There is no rebound and no guarding.  Musculoskeletal: Normal range of motion. He exhibits no edema or tenderness.  Lymphadenopathy:    He has no cervical adenopathy.  Neurological: He is oriented to person, place, and time.  Skin: Skin is warm and dry. No rash noted. He is not diaphoretic. No erythema. No pallor.  Psychiatric: Judgment normal. His mood appears not anxious. His affect is not angry, not blunt, not labile and not inappropriate. His speech is not rapid and/or pressured, not delayed and not tangential. He is withdrawn. He is not slowed and not actively hallucinating. Cognition and memory are normal. He exhibits a depressed mood. He expresses no homicidal and no suicidal ideation. He expresses no suicidal plans and no homicidal plans. He is attentive.  Vitals reviewed.   Lab Results  Component Value Date   WBC 6.2 03/11/2015   HGB 13.7 03/11/2015   HCT 41.0 03/11/2015   PLT 435* 03/11/2015   GLUCOSE 110* 07/27/2015   CHOL 135 01/17/2015   TRIG 117.0 01/17/2015   HDL 43.70 01/17/2015   LDLDIRECT 145.0 08/26/2014   LDLCALC 68 01/17/2015   ALT 34 08/26/2014   AST 22 08/26/2014   NA 144 07/27/2015   K 4.2 07/27/2015   CL 106 07/27/2015   CREATININE 1.40 07/27/2015   BUN 12 07/27/2015   CO2 32 07/27/2015   TSH 2.19 08/26/2014   PSA 0.45 08/26/2014   INR 1.0 11/02/2014   HGBA1C 6.0 01/17/2015    No results found.  Assessment & Plan:   Kobi was seen today for depression.  Diagnoses and all orders for this visit:  OSA (obstructive sleep apnea)- I've asked him to have this evaluated and treated -     Ambulatory referral to Pulmonology  Depression, acute- I think he would benefit from having support for norepinephrine, dopamine and serotonin neurotransmitters so I have asked him to start a combination of duloxetine and bupropion. He is not willing to do psychotherapy. -     DULoxetine (CYMBALTA) 30 MG  capsule; Take 1 capsule (30 mg total) by mouth daily. -     buPROPion (WELLBUTRIN XL) 150 MG 24 hr tablet; Take 1 tablet (150 mg total) by mouth daily.  Essential hypertension- his blood pressure is well-controlled, electrolytes and renal function are stable. -     Basic metabolic panel; Future  BPH (benign prostatic hypertrophy) with urinary obstruction- I will treat this with daily Cialis. -     tadalafil (CIALIS) 5 MG tablet; Take 1 tablet (5 mg total) by mouth daily as needed for erectile dysfunction.  I am having Mr. Elmendorf start on DULoxetine, buPROPion, and tadalafil. I am also having him maintain his aspirin, Fish Oil, Potassium Chloride ER, clopidogrel, atorvastatin, and valsartan-hydrochlorothiazide.  Meds ordered this encounter  Medications  . DULoxetine (CYMBALTA) 30 MG capsule    Sig: Take 1 capsule (30 mg total) by mouth daily.    Dispense:  90 capsule    Refill:  1  . buPROPion (WELLBUTRIN XL) 150 MG 24 hr tablet    Sig: Take 1 tablet (  150 mg total) by mouth daily.    Dispense:  90 tablet    Refill:  1  . tadalafil (CIALIS) 5 MG tablet    Sig: Take 1 tablet (5 mg total) by mouth daily as needed for erectile dysfunction.    Dispense:  30 tablet    Refill:  11     Follow-up: Return in about 4 weeks (around 08/24/2015).  Scarlette Calico, MD

## 2015-08-09 ENCOUNTER — Encounter: Payer: Self-pay | Admitting: *Deleted

## 2015-08-12 ENCOUNTER — Ambulatory Visit: Payer: 59 | Admitting: Internal Medicine

## 2015-09-08 ENCOUNTER — Telehealth: Payer: Self-pay | Admitting: Internal Medicine

## 2015-09-08 NOTE — Telephone Encounter (Signed)
New Message:  Amy is calling about the clearance form that was faxed to the office in regards to the patient. She states that it is illegible and wanted some clarity on what is stated on the form. Please f/u with her

## 2015-09-08 NOTE — Telephone Encounter (Signed)
Spoke with Amy at [facility]. The fax they received for surgery clearance was difficult to read in some parts. I read to her Dr. Alan Ripper notes on the clearance form. Gave form to Kim in [facility] to scan into EPIC.

## 2015-09-09 ENCOUNTER — Ambulatory Visit: Payer: Self-pay | Admitting: Surgery

## 2015-10-13 ENCOUNTER — Ambulatory Visit (INDEPENDENT_AMBULATORY_CARE_PROVIDER_SITE_OTHER): Payer: 59 | Admitting: Pulmonary Disease

## 2015-10-13 ENCOUNTER — Encounter: Payer: Self-pay | Admitting: Pulmonary Disease

## 2015-10-13 VITALS — BP 152/88 | HR 72 | Ht 71.0 in | Wt 262.0 lb

## 2015-10-13 DIAGNOSIS — G4731 Primary central sleep apnea: Secondary | ICD-10-CM | POA: Insufficient documentation

## 2015-10-13 DIAGNOSIS — Z720 Tobacco use: Secondary | ICD-10-CM

## 2015-10-13 NOTE — Assessment & Plan Note (Signed)
Smoking cessation  

## 2015-10-13 NOTE — Assessment & Plan Note (Signed)
Pt with CSA based on study done in 02/2015. Also had hypopneas and obstructiva apneas but majority were central apneas. Not on sedating meds. (-) neurologic dx.  2decho in [id number] - (-) for systolic CHF.  Pt very symptomatic. Has hypersomnia affecting fxnality.  Plan : 1. Plan for cpap and/or Bipap and/or ASV study. 2. May need to investigate cause of CSA, will need echo on f/u.   We extensively discussed the diagnosis, pathophysiology, and treatment options for Obstructive Sleep Apnea (OSA) and CSA.  We discussed treatment options for OSA and CSA  including CPAP, BiPaP, as well as ASV machine.   Patient was instructed to call the office if he/she has not received the PAP device in 1-2 weeks after sleep study.  Patient was instructed to have mask, tubings, filter, reservoir cleaned at least once a week with soapy water.  Patient was instructed to call the office if he/she is having issues with the PAP device.    I advised patient to obtain sufficient amount of sleep --  7 to 8 hours at least in a 24 hr period.  Patient was advised to follow good sleep hygiene.  Patient was advised NOT to engage in activities requiring concentration and/or vigilance if he/she is and  sleepy.  Patient is NOT to drive if he/she is sleepy.

## 2015-10-13 NOTE — Patient Instructions (Signed)
It was a pleasure taking care of you today!  You are diagnosed with severe central sleep apnea.  We will get a lab titration study.  You will be scheduled to have a lab sleep study in 4-6 weeks.  Someone from the sleep lab will call you in 2-3 days to schedule the study with you.  They usually have cancellations every night so most likely, they will have openings for a lab sleep study next week or so.  We encourage you to do your sleep study then if possible. Please give [place] a call in a week is no one from the sleep lab calls you in 2-3 days.   We will order you a CPAP or BiPAP or ASV machine after the sleep study. Please call the office if you do NOT receive your machine in the next 1-2 weeks.   Please make sure you use your CPAP device everytime you sleep.  We will monitor the usage of your machine per your insurance requirement.  Your insurance company may take the machine from you if you are not using it regularly.   Please clean the mask, tubings, filter, water reservoir with soapy water every week.  Please use distilled water for the water reservoir.   Please call the office or your machine provider (DME company) if you are having issues with the device.   Return to clinic in 6-8 weeks with Dr. Corrie Dandy or NP
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
## 2015-10-13 NOTE — Assessment & Plan Note (Signed)
Weight reduction 

## 2015-11-30 ENCOUNTER — Ambulatory Visit (HOSPITAL_BASED_OUTPATIENT_CLINIC_OR_DEPARTMENT_OTHER): Payer: 59 | Admitting: Pulmonary Disease

## 2015-12-07 ENCOUNTER — Encounter (HOSPITAL_BASED_OUTPATIENT_CLINIC_OR_DEPARTMENT_OTHER): Payer: Self-pay

## 2015-12-07 ENCOUNTER — Ambulatory Visit (HOSPITAL_BASED_OUTPATIENT_CLINIC_OR_DEPARTMENT_OTHER): Payer: Medicare Other | Attending: Pulmonary Disease | Admitting: Pulmonary Disease

## 2015-12-07 DIAGNOSIS — G4731 Primary central sleep apnea: Secondary | ICD-10-CM | POA: Insufficient documentation

## 2015-12-07 DIAGNOSIS — G4733 Obstructive sleep apnea (adult) (pediatric): Secondary | ICD-10-CM | POA: Diagnosis not present

## 2015-12-08 ENCOUNTER — Ambulatory Visit: Payer: 59 | Admitting: Pulmonary Disease

## 2015-12-08 ENCOUNTER — Telehealth: Payer: Self-pay | Admitting: Pulmonary Disease

## 2015-12-08 NOTE — Telephone Encounter (Signed)
Spoke with pt. He had a CPAP titration study last night. Has an appointment today and does not think he needs to keep it. AD's next available appointment is next Tuesday.  AD - please advise if pt needs to keep his appointment today at 3:15pm or if we can move this back to Tuesday. Thanks.

## 2015-12-08 NOTE — Telephone Encounter (Signed)
Pt cb appt resched, nothing further needed

## 2015-12-08 NOTE — Telephone Encounter (Signed)
Per AD pt is fine to reschedule appt.   LMTCB

## 2015-12-13 ENCOUNTER — Telehealth: Payer: Self-pay | Admitting: Pulmonary Disease

## 2015-12-13 ENCOUNTER — Ambulatory Visit (INDEPENDENT_AMBULATORY_CARE_PROVIDER_SITE_OTHER): Payer: Medicare Other | Admitting: Pulmonary Disease

## 2015-12-13 ENCOUNTER — Encounter: Payer: Self-pay | Admitting: Pulmonary Disease

## 2015-12-13 DIAGNOSIS — G4731 Primary central sleep apnea: Secondary | ICD-10-CM | POA: Diagnosis not present

## 2015-12-13 DIAGNOSIS — Z720 Tobacco use: Secondary | ICD-10-CM | POA: Insufficient documentation

## 2015-12-13 DIAGNOSIS — G4733 Obstructive sleep apnea (adult) (pediatric): Secondary | ICD-10-CM

## 2015-12-13 NOTE — Assessment & Plan Note (Signed)
Smoking cessation done. Told patient regarding influenza vaccine. He said he received 2 vaccines in June 2017. Told him to touch base with his primary care doctor.

## 2015-12-13 NOTE — Assessment & Plan Note (Addendum)
Pt with CSA based on study done in 02/2015. AHI 106.  Also had hypopneas and obstructiva apneas but majority were central apneas. Not on sedating meds. (-) neurologic dx.  2decho in 123456 - (-) for systolic CHF.  Pt very symptomatic. Has hypersomnia affecting fxnality.   Plan : 1. Patient had a titration study on August 30. Has not been scored yet. I requested the lab to score the study so we can order his machine soon. Pt felt better using CPAP during that study. 2. May need to investigate cause of CSA, will need echo on f/u. Need to ask Cards if rpt echo is being considered.   We extensively discussed the diagnosis, pathophysiology, and treatment options for Obstructive Sleep Apnea (OSA) and CSA.  We discussed treatment options for OSA and CSA  including CPAP, BiPaP, as well as ASV machine.   Patient was instructed to call the office if he/she has not received the PAP device in 1-2 weeks after sleep study.  Patient was instructed to have mask, tubings, filter, reservoir cleaned at least once a week with soapy water.  Patient was instructed to call the office if he/she is having issues with the PAP device.    I advised patient to obtain sufficient amount of sleep --  7 to 8 hours at least in a 24 hr period.  Patient was advised to follow good sleep hygiene.  Patient was advised NOT to engage in activities requiring concentration and/or vigilance if he/she is and  sleepy.  Patient is NOT to drive if he/she is sleepy.

## 2015-12-13 NOTE — Procedures (Signed)
    NAME: James Sawyer DATE OF BIRTH:  12/13/1950 MEDICAL RECORD NUMBER [id number]  LOCATION: [facility]  PHYSICIAN: [place]  DATE OF STUDY: 11/30/2015   Pt did NOT show up on 11/30/15. No sleep study was done.  Monica Becton, MD 12/13/2015, 1:39 PM [place] [facility] Pager [phone] After 3 pm or if no answer, call [phone]

## 2015-12-13 NOTE — Telephone Encounter (Signed)
  Please call the pt and tell the pt we will start him on autocpap.  I just read his inlab cpap/bipap/asv  titration study.   Please order autoCPAP 10-15 cm H2O. Patient will need a mask fitting session. Patient will need a 1 month download.   Patient needs to be seen by me or any of the NPs/APPs  4-6 weeks after obtaining the cpap machine. Let me know if you receive this.   Thanks!   J. Shirl Harris, MD 12/13/2015, 1:37 PM

## 2015-12-13 NOTE — Assessment & Plan Note (Signed)
Weight reduction 

## 2015-12-13 NOTE — Patient Instructions (Signed)
  It was a pleasure taking care of you today!  You are diagnosed with Obstructive Sleep Apnea or OSA.  You stop breathing  106 times/hr.   We will order you a CPAP  Machine after I read your sleep study this week. Please call the office if you do NOT receive your machine in the next 1-2 weeks.   Please make sure you use your CPAP device everytime you sleep.  We will monitor the usage of your machine per your insurance requirement.  Your insurance company may take the machine from you if you are not using it regularly.   Please clean the mask, tubings, filter, water reservoir with soapy water every week.  Please use distilled water for the water reservoir.   Please call the office or your machine provider (DME company) if you are having issues with the device.    Return to clinic in 6 weeks with Dr. Corrie Dandy or NP.

## 2015-12-13 NOTE — Progress Notes (Signed)
Subjective:    Patient ID: James Sawyer, male    DOB: 1950-12-04, 65 y.o.   MRN: [id number]  HPI   This is the case of James Sawyer, 65 y.o. Male, who was referred by Dr. Scarlette Calico  in consultation regarding CSA. .   As you very well know, patient has hypersomnia.worsening the last 2 yrs. 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 Hypersomnia affects fxnality. He sleeps all day.  Has apneas, gasping, choking, snoring.  (+) sleep talking. (-) other abnormal behavior.   Sleep study was done in 02/2015. AHI was 106.   Pt denies any lung problems. 1 pack for 3-4 days x 50 yrs. Currently smokes.   ROV  12/13/15 Patient returns to the office as follow-up on his central sleep apnea. Since last seen, he had a CPAP titration study done on August 30th during the day. He felt comfortable using CPAP during that study. No issues with it. Felt better after that study. Has not been admitted nor been on antibiotics since last seen. Continues to have hypersomnia which affects his functionality. Still smokes cigarettes, one pack per day.    Review of Systems  Constitutional: Negative.  Negative for fever and unexpected weight change.  HENT: Negative.  Negative for congestion, dental problem, ear pain, nosebleeds, postnasal drip, rhinorrhea, sinus pressure, sneezing, sore throat and trouble swallowing.   Eyes: Negative.  Negative for redness and itching.  Respiratory: Positive for shortness of breath. Negative for chest tightness and wheezing.   Cardiovascular: Negative for palpitations and leg swelling.  Gastrointestinal: Negative.  Negative for nausea and vomiting.  Endocrine: Negative.   Genitourinary: Negative.  Negative for dysuria.  Musculoskeletal: Positive for arthralgias. Negative for joint swelling.  Skin: Negative.  Negative for rash.  Allergic/Immunologic: Negative.   Neurological: Negative.  Negative for headaches.    Hematological: Negative.  Does not bruise/bleed easily.  Psychiatric/Behavioral: Negative.  Negative for dysphoric mood. The patient is not nervous/anxious.          Objective:   Physical Exam   Vitals:  Vitals:   12/13/15 1038  BP: 132/82  Pulse: 70  SpO2: 95%  Weight: 261 lb (118.4 kg)  Height: 5\' 10"  (1.778 m)    Constitutional/General:  Pleasant, well-nourished, well-developed, not in any distress,  Comfortably seating.  Well kempt  Body mass index is 37.45 kg/m. Wt Readings from Last 3 Encounters:  12/13/15 261 lb (118.4 kg)  11/30/15 262 lb (118.8 kg)  10/13/15 262 lb (118.8 kg)    Neck circumference:   HEENT: Pupils equal and reactive to light and accommodation. Anicteric sclerae. Normal nasal mucosa.   No oral  lesions,  mouth clear,  oropharynx clear, no postnasal drip. (-) Oral thrush. No dental caries.  Airway - Mallampati class IV  Neck: No masses. Midline trachea. No JVD, (-) LAD. (-) bruits appreciated.  Respiratory/Chest: Grossly normal chest. (-) deformity. (-) Accessory muscle use.  Symmetric expansion. (-) Tenderness on palpation.  Resonant on percussion.  Diminished BS on both lower lung zones. (-) wheezing, crackles, rhonchi (-) egophony  Cardiovascular: Regular rate and  rhythm, heart sounds normal, no murmur or gallops, no peripheral edema  Gastrointestinal:  Normal bowel sounds. Soft, non-tender. No hepatosplenomegaly.  (-) masses.   Musculoskeletal:  Normal muscle tone. Normal gait.   Extremities: Grossly normal. (-) clubbing, cyanosis.  (-) edema  Skin: (-) rash,lesions seen.   Neurological/Psychiatric :  alert, oriented to time, place, person. Normal mood and affect           Assessment & Plan:  Morbid obesity Weight reduction  Central sleep apnea Pt with CSA based on study done in 02/2015. AHI 106.  Also had hypopneas and obstructiva apneas but majority were central apneas. Not on sedating meds. (-) neurologic  dx.  2decho in 123456 - (-) for systolic CHF.  Pt very symptomatic. Has hypersomnia affecting fxnality.   Plan : 1. Patient had a titration study on August 30. Has not been scored yet. I requested the lab to score the study so we can order his machine soon. Pt felt better using CPAP during that study. 2. May need to investigate cause of CSA, will need echo on f/u. Need to ask Cards if rpt echo is being considered.   We extensively discussed the diagnosis, pathophysiology, and treatment options for Obstructive Sleep Apnea (OSA) and CSA.  We discussed treatment options for OSA and CSA  including CPAP, BiPaP, as well as ASV machine.   Patient was instructed to call the office if he/she has not received the PAP device in 1-2 weeks after sleep study.  Patient was instructed to have mask, tubings, filter, reservoir cleaned at least once a week with soapy water.  Patient was instructed to call the office if he/she is having issues with the PAP device.    I advised patient to obtain sufficient amount of sleep --  7 to 8 hours at least in a 24 hr period.  Patient was advised to follow good sleep hygiene.  Patient was advised NOT to engage in activities requiring concentration and/or vigilance if he/she is and  sleepy.  Patient is NOT to drive if he/she is sleepy.      Tobacco user Smoking cessation done. Told patient regarding influenza vaccine. He said he received 2 vaccines in June 2017. Told him to touch base with his primary care doctor.    Patient will follow up with me in 6-8 weeks. Need close f/u 2/2 CSA.     Monica Becton, MD 12/13/2015   11:09 AM Pulmonary and [place] Pager: [phone] Office: [phone], Fax: [phone]

## 2015-12-13 NOTE — Procedures (Signed)
NAME: James Sawyer DATE OF BIRTH:  11-16-1950 MEDICAL RECORD NUMBER [id number]  LOCATION: [facility]  PHYSICIAN: [place] OF STUDY: 12/07/2015   Patient Name: James Sawyer, James Sawyer Date: 12/07/2015   Gender: Male  D.O.B: 14-Mar-1951  Age (years): 59  Referring Provider: Murray Hodgkins   Height (inches): 56  Interpreting Physician: [place]   Weight (lbs): 262  RPSGT: Jacolyn Reedy   BMI: 38  MRN: [id number]  Neck Size: 19.00    CLINICAL INFORMATION  The patient is referred for a PAP titration study. He ended up having CPAP, BiPaP and adaptive servo-ventilator titration study. Most recent polysomnogram dated 02/20/2015 revealed an AHI of 106.4/h.   MEDICATIONS  Medications taken by the patient : N/A. Medications reviewed per chart review. Medications administered by patient during sleep study : No sleep medicine administered.   SLEEP STUDY TECHNIQUE  As per the AASM Manual for the Scoring of Sleep and Associated Events v2.3 (April 2016) with a hypopnea requiring 4% desaturations.  The channels recorded and monitored were frontal, central and occipital EEG, electrooculogram (EOG), submentalis EMG (chin), nasal and oral airflow, thoracic and abdominal wall motion, anterior tibialis EMG, snore microphone, electrocardiogram, and pulse oximetry.                              SLEEP ARCHITECTURE  During a recording time of 363.5 minutes, the patient slept for 281.0 minutes. Sleep efficiency was 77.3%. The patient spent 10.32% of the night in stage N1 sleep, 64.23% in stage N2 sleep, 0.00% in stage N3 and 25.44% in REM. Wake after sleep onset (WASO) was 61.2 minutes. Alpha intrusion was PRESENT ABSENT. Supine sleep was 50.89%. The arousal index was 16.2.  Patient had CPAP, BiPAP, ASV titration study. At CPAP 11 cm water, he had 20 minutes of REM sleep. His AHI was 0. Lowest O2 saturation was 93%. He also did fair on CPAP of  12 cm water. At BiPAP and higher CPAP pressures of 15 cm water, he had more apneic events. He had central apneas on BiPAP. He also tolerated ASV but his AHI from 6-8.  LEG MOVEMENT DATA   PLM Index (/hr):  PLM Arousal Index (/hr): 0.0   CARDIAC DATA  The 2 lead EKG demonstrated sinus rhythm. The mean heart rate was 55.42 beats per minute. Other EKG findings include: None.   DIAGNOSIS  Central Sleep Apnea (327.23 [G47.33 ICD-10])  RECOMMENDATIONS  Trial with auto CPAP 10-15 cm water. Patient will need a mask fitting session to determine best mask fit. He will also need a 1 month download on auto CPAP.  If his AHI is not corrected on autoCPAP, he may do well on Auto ASV machine as seen in this study. At that point, suggest trying patient on autoASV EPAP Min 5 cm water and Max 7 cmH2O, Pressure Support Min 5 cm water and Max 15 cmH2O with Max Pressure of 15 cmH2O and auto Breath Rate.  Avoid alcohol, sedatives and other CNS depressants that may worsen sleep apnea and disrupt normal sleep architecture.  Sleep hygiene should be reviewed to assess factors that may improve sleep quality.  Weight management and regular exercise should be initiated or continued.  Return to the office 4-6 weeks after obtaining CPAP machine.  Monica Becton, MD 12/13/2015, 1:35 PM  Pulmonary and Critical Care Pager [phone]  [phone] After 3 pm or if no answer, call [phone]

## 2015-12-15 NOTE — Telephone Encounter (Signed)
Spoke with pt and gave results and recommendations. Pt agrees to start CPAP therapy. Order placed. Pt aware to call office and schedule f/u appt once starts CPAP. Nothing further needed.  

## 2015-12-15 NOTE — Addendum Note (Signed)
Addended by: Beckie Busing on: 12/15/2015 09:14 AM   Modules accepted: Orders

## 2015-12-16 ENCOUNTER — Telehealth: Payer: Self-pay | Admitting: Pulmonary Disease

## 2015-12-16 NOTE — Telephone Encounter (Signed)
Patient called returning our call - pr

## 2015-12-16 NOTE — Telephone Encounter (Signed)
Patient calling to schedule Mask Fitting.  Gave him [facility]'s phone number to schedule appointment.  Patient verbalized understanding. Nothing further needed.

## 2016-02-01 ENCOUNTER — Telehealth: Payer: Self-pay | Admitting: Pulmonary Disease

## 2016-02-01 NOTE — Telephone Encounter (Signed)
Pt returned call. Contact number [phone].James Sawyer

## 2016-02-01 NOTE — Telephone Encounter (Signed)
Pt returned phone call... Contact number [phone].James Sawyer

## 2016-02-01 NOTE — Telephone Encounter (Signed)
LM x 1 for pt 

## 2016-02-01 NOTE — Telephone Encounter (Signed)
Spoke with pt. He wanted to let us know that he will not be starting CPAP therapy. States that his insurance is not covering this at 100%. He would owe [employer] $43 up front and then $13 a month. Pt states he can't afford this. Advised pt that if he changes his mind, to let us know. Nothing further was needed at this time.

## 2016-02-01 NOTE — Telephone Encounter (Signed)
lmtcb x1 for pt. 

## 2016-02-03 ENCOUNTER — Ambulatory Visit: Payer: Medicare Other | Admitting: Pulmonary Disease

## 2016-03-02 ENCOUNTER — Encounter (HOSPITAL_COMMUNITY): Payer: Self-pay | Admitting: Emergency Medicine

## 2016-03-02 ENCOUNTER — Emergency Department (HOSPITAL_COMMUNITY)
Admission: EM | Admit: 2016-03-02 | Discharge: 2016-03-02 | Disposition: A | Discharge status: Home / Self Care | Payer: Medicare Other | Attending: Emergency Medicine | Admitting: Emergency Medicine

## 2016-03-02 DIAGNOSIS — J449 Chronic obstructive pulmonary disease, unspecified: Secondary | ICD-10-CM | POA: Diagnosis not present

## 2016-03-02 DIAGNOSIS — R319 Hematuria, unspecified: Secondary | ICD-10-CM

## 2016-03-02 DIAGNOSIS — F17210 Nicotine dependence, cigarettes, uncomplicated: Secondary | ICD-10-CM | POA: Insufficient documentation

## 2016-03-02 DIAGNOSIS — Z7982 Long term (current) use of aspirin: Secondary | ICD-10-CM | POA: Diagnosis not present

## 2016-03-02 DIAGNOSIS — I129 Hypertensive chronic kidney disease with stage 1 through stage 4 chronic kidney disease, or unspecified chronic kidney disease: Secondary | ICD-10-CM | POA: Diagnosis not present

## 2016-03-02 DIAGNOSIS — R109 Unspecified abdominal pain: Secondary | ICD-10-CM

## 2016-03-02 DIAGNOSIS — N189 Chronic kidney disease, unspecified: Secondary | ICD-10-CM | POA: Insufficient documentation

## 2016-03-02 DIAGNOSIS — N23 Unspecified renal colic: Secondary | ICD-10-CM

## 2016-03-02 DIAGNOSIS — N200 Calculus of kidney: Secondary | ICD-10-CM | POA: Insufficient documentation

## 2016-03-02 DIAGNOSIS — I2510 Atherosclerotic heart disease of native coronary artery without angina pectoris: Secondary | ICD-10-CM | POA: Diagnosis not present

## 2016-03-02 DIAGNOSIS — R10A1 Flank pain, right side: Secondary | ICD-10-CM

## 2016-03-02 LAB — URINALYSIS, ROUTINE W REFLEX MICROSCOPIC
Bilirubin Urine: NEGATIVE
GLUCOSE, UA: NEGATIVE mg/dL
KETONES UR: NEGATIVE mg/dL
LEUKOCYTES UA: NEGATIVE
NITRITE: NEGATIVE
PROTEIN: NEGATIVE mg/dL
Specific Gravity, Urine: 1.02 (ref 1.005–1.030)
pH: 5.5 (ref 5.0–8.0)

## 2016-03-02 LAB — I-STAT CHEM 8, ED
BUN: 27 mg/dL — ABNORMAL HIGH (ref 6–20)
CALCIUM ION: 1.2 mmol/L (ref 1.15–1.40)
CHLORIDE: 107 mmol/L (ref 101–111)
CREATININE: 2 mg/dL — AB (ref 0.61–1.24)
GLUCOSE: 96 mg/dL (ref 65–99)
HCT: 46 % (ref 39.0–52.0)
Hemoglobin: 15.6 g/dL (ref 13.0–17.0)
Potassium: 4 mmol/L (ref 3.5–5.1)
Sodium: 146 mmol/L — ABNORMAL HIGH (ref 135–145)
TCO2: 26 mmol/L (ref 0–100)

## 2016-03-02 LAB — URINE MICROSCOPIC-ADD ON: Bacteria, UA: NONE SEEN

## 2016-03-02 MED ORDER — TAMSULOSIN HCL 0.4 MG PO CAPS: 0.4000 mg | ORAL_CAPSULE | Freq: Every day | ORAL | 0 refills | Status: AC

## 2016-03-02 MED ORDER — TAMSULOSIN HCL 0.4 MG PO CAPS
0.4000 mg | ORAL_CAPSULE | Freq: Once | ORAL | Status: AC
  Administered 2016-03-02: 0.4 mg via ORAL
  Filled 2016-03-02: qty 1

## 2016-03-02 MED ORDER — HYDROCODONE-ACETAMINOPHEN 5-325 MG PO TABS: 1.0000 | ORAL_TABLET | Freq: Three times a day (TID) | ORAL | 0 refills | Status: AC | PRN

## 2016-03-02 MED ORDER — KETOROLAC TROMETHAMINE 60 MG/2ML IM SOLN
30.0000 mg | Freq: Once | INTRAMUSCULAR | Status: AC
  Administered 2016-03-02: 30 mg via INTRAMUSCULAR
  Filled 2016-03-02: qty 2

## 2016-03-02 MED ORDER — ACETAMINOPHEN 500 MG PO TABS: 1000.0000 mg | ORAL_TABLET | Freq: Three times a day (TID) | ORAL | 0 refills | Status: AC

## 2016-03-02 NOTE — ED Triage Notes (Signed)
Pt c/o right sided flank pain onset yesterday, having trouble sleeping due to pain. Denies difficulty/ pain while voiding.
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
## 2016-03-23 ENCOUNTER — Other Ambulatory Visit: Payer: Self-pay | Admitting: Internal Medicine

## 2016-03-23 DIAGNOSIS — I10 Essential (primary) hypertension: Secondary | ICD-10-CM

## 2016-03-23 DIAGNOSIS — E785 Hyperlipidemia, unspecified: Secondary | ICD-10-CM

## 2016-03-23 DIAGNOSIS — I2510 Atherosclerotic heart disease of native coronary artery without angina pectoris: Secondary | ICD-10-CM

## 2016-03-26 ENCOUNTER — Other Ambulatory Visit: Payer: Self-pay | Admitting: *Deleted

## 2016-03-26 DIAGNOSIS — E785 Hyperlipidemia, unspecified: Secondary | ICD-10-CM

## 2016-03-26 DIAGNOSIS — I10 Essential (primary) hypertension: Secondary | ICD-10-CM

## 2016-03-26 DIAGNOSIS — I2510 Atherosclerotic heart disease of native coronary artery without angina pectoris: Secondary | ICD-10-CM

## 2016-03-26 MED ORDER — CLOPIDOGREL BISULFATE 75 MG PO TABS: 75.0000 mg | ORAL_TABLET | Freq: Every day | ORAL | 0 refills | Status: DC

## 2016-05-09 ENCOUNTER — Other Ambulatory Visit (INDEPENDENT_AMBULATORY_CARE_PROVIDER_SITE_OTHER): Payer: Medicare Other

## 2016-05-09 ENCOUNTER — Encounter: Payer: Self-pay | Admitting: Internal Medicine

## 2016-05-09 ENCOUNTER — Ambulatory Visit (INDEPENDENT_AMBULATORY_CARE_PROVIDER_SITE_OTHER): Payer: Medicare Other | Admitting: Internal Medicine

## 2016-05-09 VITALS — BP 144/88 | HR 55 | Temp 98.0°F | Resp 16 | Ht 71.0 in | Wt 266.5 lb

## 2016-05-09 DIAGNOSIS — I110 Hypertensive heart disease with heart failure: Secondary | ICD-10-CM | POA: Diagnosis not present

## 2016-05-09 DIAGNOSIS — I441 Atrioventricular block, second degree: Secondary | ICD-10-CM | POA: Diagnosis not present

## 2016-05-09 DIAGNOSIS — E785 Hyperlipidemia, unspecified: Secondary | ICD-10-CM

## 2016-05-09 DIAGNOSIS — G4731 Primary central sleep apnea: Secondary | ICD-10-CM

## 2016-05-09 DIAGNOSIS — R600 Localized edema: Secondary | ICD-10-CM

## 2016-05-09 DIAGNOSIS — Z1211 Encounter for screening for malignant neoplasm of colon: Secondary | ICD-10-CM

## 2016-05-09 DIAGNOSIS — R739 Hyperglycemia, unspecified: Secondary | ICD-10-CM | POA: Diagnosis not present

## 2016-05-09 DIAGNOSIS — I2510 Atherosclerotic heart disease of native coronary artery without angina pectoris: Secondary | ICD-10-CM | POA: Diagnosis not present

## 2016-05-09 DIAGNOSIS — I10 Essential (primary) hypertension: Secondary | ICD-10-CM

## 2016-05-09 LAB — CBC WITH DIFFERENTIAL/PLATELET
BASOS ABS: 0 10*3/uL (ref 0.0–0.1)
Basophils Relative: 0.3 % (ref 0.0–3.0)
EOS ABS: 0.1 10*3/uL (ref 0.0–0.7)
Eosinophils Relative: 2.4 % (ref 0.0–5.0)
HCT: 43.3 % (ref 39.0–52.0)
Hemoglobin: 14.4 g/dL (ref 13.0–17.0)
LYMPHS ABS: 2.3 10*3/uL (ref 0.7–4.0)
LYMPHS PCT: 46 % (ref 12.0–46.0)
MCHC: 33.3 g/dL (ref 30.0–36.0)
MCV: 95.9 fl (ref 78.0–100.0)
Monocytes Absolute: 0.5 10*3/uL (ref 0.1–1.0)
Monocytes Relative: 9.3 % (ref 3.0–12.0)
NEUTROS ABS: 2.1 10*3/uL (ref 1.4–7.7)
NEUTROS PCT: 42 % — AB (ref 43.0–77.0)
PLATELETS: 429 10*3/uL — AB (ref 150.0–400.0)
RBC: 4.52 Mil/uL (ref 4.22–5.81)
RDW: 14.9 % (ref 11.5–15.5)
WBC: 5.1 10*3/uL (ref 4.0–10.5)

## 2016-05-09 LAB — URINALYSIS, ROUTINE W REFLEX MICROSCOPIC
Bilirubin Urine: NEGATIVE
HGB URINE DIPSTICK: NEGATIVE
Ketones, ur: NEGATIVE
Leukocytes, UA: NEGATIVE
Nitrite: NEGATIVE
PH: 5.5 (ref 5.0–8.0)
RBC / HPF: NONE SEEN (ref 0–?)
Specific Gravity, Urine: 1.025 (ref 1.000–1.030)
UROBILINOGEN UA: 0.2 (ref 0.0–1.0)
Urine Glucose: NEGATIVE

## 2016-05-09 LAB — COMPREHENSIVE METABOLIC PANEL
ALT: 21 U/L (ref 0–53)
AST: 18 U/L (ref 0–37)
Albumin: 4.3 g/dL (ref 3.5–5.2)
Alkaline Phosphatase: 66 U/L (ref 39–117)
BILIRUBIN TOTAL: 0.7 mg/dL (ref 0.2–1.2)
BUN: 19 mg/dL (ref 6–23)
CALCIUM: 9.3 mg/dL (ref 8.4–10.5)
CO2: 33 meq/L — AB (ref 19–32)
CREATININE: 1.56 mg/dL — AB (ref 0.40–1.50)
Chloride: 110 mEq/L (ref 96–112)
GFR: 57.64 mL/min — ABNORMAL LOW (ref >60.00)
GLUCOSE: 100 mg/dL — AB (ref 70–99)
Potassium: 4.2 mEq/L (ref 3.5–5.1)
Sodium: 145 mEq/L (ref 135–145)
TOTAL PROTEIN: 7.3 g/dL (ref 6.0–8.3)

## 2016-05-09 LAB — LIPID PANEL
CHOL/HDL RATIO: 3
Cholesterol: 126 mg/dL (ref 0–200)
HDL: 41.1 mg/dL (ref >39.00)
LDL Cholesterol: 67 mg/dL (ref 0–99)
NonHDL: 84.7
TRIGLYCERIDES: 90 mg/dL (ref 0.0–149.0)
VLDL: 18 mg/dL (ref 0.0–40.0)

## 2016-05-09 LAB — TROPONIN I: TNIDX: 0.03 ug/l (ref 0.00–0.06)

## 2016-05-09 LAB — TSH: TSH: 1.89 u[IU]/mL (ref 0.35–4.50)

## 2016-05-09 LAB — BRAIN NATRIURETIC PEPTIDE: PRO B NATRI PEPTIDE: 15 pg/mL (ref 0.0–100.0)

## 2016-05-09 LAB — HEMOGLOBIN A1C: Hgb A1c MFr Bld: 5.8 % (ref 4.6–6.5)

## 2016-05-09 MED ORDER — POTASSIUM CHLORIDE ER 20 MEQ PO TBCR: EXTENDED_RELEASE_TABLET | ORAL | 3 refills | Status: DC

## 2016-05-09 MED ORDER — TORSEMIDE 20 MG PO TABS: 20.0000 mg | ORAL_TABLET | Freq: Every day | ORAL | 1 refills | Status: DC

## 2016-05-09 MED ORDER — TELMISARTAN 80 MG PO TABS: 80.0000 mg | ORAL_TABLET | Freq: Every day | ORAL | 3 refills | Status: DC

## 2016-05-09 NOTE — Progress Notes (Signed)
Subjective:  Patient ID: James Sawyer, male    DOB: [birth year]  Age: 66 y.o. MRN: [id number]  CC: Hypertension and Hyperlipidemia   HPI METE STAHLBERG presents for concerns about worsening lower extremity edema, weight gain, and DOE.  Outpatient Medications Prior to Visit  Medication Sig Dispense Refill  . aspirin 81 MG tablet Take 81 mg by mouth daily.    Marland Kitchen atorvastatin (LIPITOR) 40 MG tablet Take 1 tablet (40 mg total) by mouth daily. *Patient is overdue for an appointment. Please call and schedule for further refills* 30 tablet 0  . buPROPion (WELLBUTRIN XL) 150 MG 24 hr tablet Take 1 tablet (150 mg total) by mouth daily. 90 tablet 1  . clopidogrel (PLAVIX) 75 MG tablet Take 1 tablet (75 mg total) by mouth daily with breakfast. *Patient is overdue for an appt. Please call and schedule for further refills* 30 tablet 0  . DULoxetine (CYMBALTA) 30 MG capsule Take 1 capsule (30 mg total) by mouth daily. 90 capsule 1  . Omega-3 Fatty Acids (FISH OIL) 1000 MG CAPS Take 1,000 mg by mouth daily.     Marland Kitchen bismuth subsalicylate (PEPTO BISMOL) 262 MG chewable tablet Chew 524 mg by mouth as needed for indigestion or diarrhea or loose stools.    . Potassium Chloride ER 20 MEQ TBCR Take 1 tablet by mouth once daily. 90 tablet 3  . potassium chloride SA (KLOR-CON M20) 20 MEQ tablet Take 1 tablet (20 mEq total) by mouth daily. *Patient is overdue for an appointment. Please call and schedule for further refills* 30 tablet 0  . tadalafil (CIALIS) 5 MG tablet Take 1 tablet (5 mg total) by mouth daily as needed for erectile dysfunction. 30 tablet 11  . valsartan-hydrochlorothiazide (DIOVAN-HCT) 160-12.5 MG tablet Take 1 tablet by mouth daily. *Patient is overdue for an appointment. Please call and schedule for further refills* 30 tablet 0   No facility-administered medications prior to visit.     ROS Review of Systems  Constitutional: Positive for unexpected weight change. Negative for appetite change,  diaphoresis and fatigue.  HENT: Negative.   Eyes: Negative for visual disturbance.  Respiratory: Positive for apnea and shortness of breath. Negative for cough, choking, chest tightness and wheezing.   Cardiovascular: Positive for leg swelling. Negative for chest pain and palpitations.  Gastrointestinal: Negative for abdominal pain, constipation, diarrhea, nausea and vomiting.  Genitourinary: Negative.  Negative for decreased urine volume, difficulty urinating, dysuria, flank pain, frequency, hematuria and urgency.  Musculoskeletal: Negative.   Skin: Negative.   Allergic/Immunologic: Negative.   Neurological: Negative.  Negative for dizziness, seizures, syncope, weakness and light-headedness.  Hematological: Negative.  Negative for adenopathy. Does not bruise/bleed easily.  Psychiatric/Behavioral: Negative.     Objective:  BP (!) 144/88 (BP Location: Left Arm, Patient Position: Sitting, Cuff Size: Large)   Pulse (!) 55   Temp 98 F (36.7 C) (Oral)   Resp 16   Ht 5\' 11"  (1.803 m)   Wt 266 lb 8 oz (120.9 kg)   SpO2 96%   BMI 37.17 kg/m   BP Readings from Last 3 Encounters:  05/09/16 (!) 144/88  03/02/16 (!) 132/112  12/13/15 132/82    Wt Readings from Last 3 Encounters:  05/09/16 266 lb 8 oz (120.9 kg)  03/02/16 260 lb (117.9 kg)  12/13/15 261 lb (118.4 kg)    Physical Exam  Constitutional: He is oriented to person, place, and time. No distress.  HENT:  Mouth/Throat: Oropharynx is clear and moist.  Eyes: Conjunctivae are normal. Right eye exhibits no discharge. Left eye exhibits no discharge. No scleral icterus.  Neck: Normal range of motion. Neck supple. No JVD present. No tracheal deviation present. No thyromegaly present.  Cardiovascular: Normal rate, regular rhythm, normal heart sounds and intact distal pulses.  Exam reveals no gallop and no friction rub.   No murmur heard. EKG -- Sinus  Bradycardia  Possible left ventricular hypertrophy on non-voltage basis.    -Nonspecific ST depression   +   T-abnormality  -Seen with left ventricular hypertrophy (strain) or digitalis effect  -Possible  Anterior/lateral  ischemia.    Pulmonary/Chest: Effort normal and breath sounds normal. No stridor. No respiratory distress. He has no wheezes. He has no rales. He exhibits no tenderness.  Abdominal: Soft. Bowel sounds are normal. He exhibits no distension and no mass. There is no tenderness. There is no rebound and no guarding.  Musculoskeletal: Normal range of motion. He exhibits edema (2+ pitting edema in BLE). He exhibits no tenderness or deformity.  Lymphadenopathy:    He has no cervical adenopathy.  Neurological: He is oriented to person, place, and time.  Skin: Skin is warm and dry. No rash noted. He is not diaphoretic. No erythema. No pallor.  Vitals reviewed.   Lab Results  Component Value Date   WBC 5.1 05/09/2016   HGB 14.4 05/09/2016   HCT 43.3 05/09/2016   PLT 429.0 (H) 05/09/2016   GLUCOSE 100 (H) 05/09/2016   CHOL 126 05/09/2016   TRIG 90.0 05/09/2016   HDL 41.10 05/09/2016   LDLDIRECT 145.0 08/26/2014   LDLCALC 67 05/09/2016   ALT 21 05/09/2016   AST 18 05/09/2016   NA 145 05/09/2016   K 4.2 05/09/2016   CL 110 05/09/2016   CREATININE 1.56 (H) 05/09/2016   BUN 19 05/09/2016   CO2 33 (H) 05/09/2016   TSH 1.89 05/09/2016   PSA 0.45 08/26/2014   INR 1.0 11/02/2014   HGBA1C 5.8 05/09/2016    No results found.  Assessment & Plan:   Rithvik was seen today for hypertension and hyperlipidemia.  Diagnoses and all orders for this visit:  Screen for colon cancer -     Ambulatory referral to Gastroenterology  Hypertensive left ventricular hypertrophy with heart failure ([place])- his BP is not adequately well controlled, will upgrade to a more potent ARB and a loop diuretic for the sodium retention and edema -     Comprehensive metabolic panel; Future -     CBC with Differential/Platelet; Future -     Urinalysis, Routine w reflex  microscopic; Future -     torsemide (DEMADEX) 20 MG tablet; Take 1 tablet (20 mg total) by mouth daily. -     Ambulatory referral to Cardiology -     Potassium Chloride ER 20 MEQ TBCR; Take 1 tablet by mouth once daily. -     telmisartan (MICARDIS) 80 MG tablet; Take 1 tablet (80 mg total) by mouth daily.  Central sleep apnea  Hyperlipidemia with target LDL less than 100- he has achieved his LDL goal and is doing well on the statin -     Lipid panel; Future -     TSH; Future  Localized edema- the labs are not c/w nephrotic syndrome, his EKG is + for LVH but his BNP and troponin are normal so I don't think the edema is caused by CHF but is related to obesity and sedentary lifestyle, will diurese with a loop diuretic, I have asked him to  f/up with cardiology as well -     Troponin I; Future -     Brain natriuretic peptide; Future -     torsemide (DEMADEX) 20 MG tablet; Take 1 tablet (20 mg total) by mouth daily. -     Ambulatory referral to Cardiology -     EKG 12-Lead  Hyperglycemia- he is prediabetic -     Comprehensive metabolic panel; Future -     Hemoglobin A1c; Future  Coronary artery disease involving native coronary artery of native heart without angina pectoris -     Ambulatory referral to Cardiology  Essential hypertension -     Potassium Chloride ER 20 MEQ TBCR; Take 1 tablet by mouth once daily. -     telmisartan (MICARDIS) 80 MG tablet; Take 1 tablet (80 mg total) by mouth daily.   I have discontinued Mr. Debroux valsartan-hydrochlorothiazide. I am also having him start on torsemide and telmisartan. Additionally, I am having him maintain his aspirin, Fish Oil, DULoxetine, buPROPion, atorvastatin, clopidogrel, and Potassium Chloride ER.  Meds ordered this encounter  Medications  . torsemide (DEMADEX) 20 MG tablet    Sig: Take 1 tablet (20 mg total) by mouth daily.    Dispense:  90 tablet    Refill:  1  . Potassium Chloride ER 20 MEQ TBCR    Sig: Take 1 tablet by  mouth once daily.    Dispense:  90 tablet    Refill:  3  . telmisartan (MICARDIS) 80 MG tablet    Sig: Take 1 tablet (80 mg total) by mouth daily.    Dispense:  90 tablet    Refill:  3     Follow-up: Return in about 6 weeks (around 06/20/2016).  Scarlette Calico, MD

## 2016-05-09 NOTE — Progress Notes (Signed)
Pre visit review using our clinic review tool, if applicable. No additional management support is needed unless otherwise documented below in the visit note. 

## 2016-05-09 NOTE — Patient Instructions (Signed)
Edema Edema is an abnormal buildup of fluids in your bodytissues. Edema is somewhatdependent on gravity to pull the fluid to the lowest place in your body. That makes the condition more common in the legs and thighs (lower extremities). Painless swelling of the feet and ankles is common and becomes more likely as you get older. It is also common in looser tissues, like around your eyes. When the affected area is squeezed, the fluid may move out of that spot and leave a dent for a few moments. This dent is called pitting. What are the causes? There are many possible causes of edema. Eating too much salt and being on your feet or sitting for a long time can cause edema in your legs and ankles. Hot weather may make edema worse. Common medical causes of edema include:  Heart failure.  Liver disease.  Kidney disease.  Weak blood vessels in your legs.  Cancer.  An injury.  Pregnancy.  Some medications.  Obesity.  What are the signs or symptoms? Edema is usually painless.Your skin may look swollen or shiny. How is this diagnosed? Your health care provider may be able to diagnose edema by asking about your medical history and doing a physical exam. You may need to have tests such as X-rays, an electrocardiogram, or blood tests to check for medical conditions that may cause edema. How is this treated? Edema treatment depends on the cause. If you have heart, liver, or kidney disease, you need the treatment appropriate for these conditions. General treatment may include:  Elevation of the affected body part above the level of your heart.  Compression of the affected body part. Pressure from elastic bandages or support stockings squeezes the tissues and forces fluid back into the blood vessels. This keeps fluid from entering the tissues.  Restriction of fluid and salt intake.  Use of a water pill (diuretic). These medications are appropriate only for some types of edema. They pull fluid  out of your body and make you urinate more often. This gets rid of fluid and reduces swelling, but diuretics can have side effects. Only use diuretics as directed by your health care provider.  Follow these instructions at home:  Keep the affected body part above the level of your heart when you are lying down.  Do not sit still or stand for prolonged periods.  Do not put anything directly under your knees when lying down.  Do not wear constricting clothing or garters on your upper legs.  Exercise your legs to work the fluid back into your blood vessels. This may help the swelling go down.  Wear elastic bandages or support stockings to reduce ankle swelling as directed by your health care provider.  Eat a low-salt diet to reduce fluid if your health care provider recommends it.  Only take medicines as directed by your health care provider. Contact a health care provider if:  Your edema is not responding to treatment.  You have heart, liver, or kidney disease and notice symptoms of edema.  You have edema in your legs that does not improve after elevating them.  You have sudden and unexplained weight gain. Get help right away if:  You develop shortness of breath or chest pain.  You cannot breathe when you lie down.  You develop pain, redness, or warmth in the swollen areas.  You have heart, liver, or kidney disease and suddenly get edema.  You have a fever and your symptoms suddenly get worse. This information is   not intended to replace advice given to you by your health care provider. Make sure you discuss any questions you have with your health care provider. Document Released: 03/26/2005 Document Revised: 09/01/2015 Document Reviewed: 01/16/2013 Elsevier Interactive Patient Education  2017 [employer]  

## 2016-05-11 ENCOUNTER — Telehealth: Payer: Self-pay | Admitting: Internal Medicine

## 2016-05-11 NOTE — Telephone Encounter (Signed)
Reviewed medications with patient. Discontinued a duplicate (potassium), pepto bisimol (completed), cialis (not taking any more).   All medications confirmed and informed pt how to take per sig. Pt will pick up potassium that is at the pharmacy.

## 2016-05-11 NOTE — Telephone Encounter (Signed)
Pt called requesting to speak with [facility] regarding his medications, he has several questions, please call ASAP, MS

## 2016-05-23 ENCOUNTER — Telehealth: Payer: Self-pay | Admitting: Internal Medicine

## 2016-05-23 ENCOUNTER — Ambulatory Visit (INDEPENDENT_AMBULATORY_CARE_PROVIDER_SITE_OTHER): Payer: Medicare Other | Admitting: Physician Assistant

## 2016-05-23 ENCOUNTER — Encounter: Payer: Self-pay | Admitting: Physician Assistant

## 2016-05-23 VITALS — BP 170/90 | HR 72 | Ht 70.0 in | Wt 260.4 lb

## 2016-05-23 DIAGNOSIS — I10 Essential (primary) hypertension: Secondary | ICD-10-CM

## 2016-05-23 DIAGNOSIS — I2510 Atherosclerotic heart disease of native coronary artery without angina pectoris: Secondary | ICD-10-CM

## 2016-05-23 DIAGNOSIS — Z79899 Other long term (current) drug therapy: Secondary | ICD-10-CM

## 2016-05-23 DIAGNOSIS — E785 Hyperlipidemia, unspecified: Secondary | ICD-10-CM | POA: Diagnosis not present

## 2016-05-23 DIAGNOSIS — G4733 Obstructive sleep apnea (adult) (pediatric): Secondary | ICD-10-CM
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
## 2016-05-23 NOTE — Telephone Encounter (Signed)
I have not contacted him.

## 2016-05-23 NOTE — Telephone Encounter (Signed)
Pt called in and wanted to know if you called him?  I Didn't see anything

## 2016-05-23 NOTE — Patient Instructions (Signed)
Your physician has recommended you make the following change in your medication:  STOP PLAVIX  Your physician recommends that you return for lab work in:  [facility] recommends that you schedule a follow-up appointment in:  [facility] Ronnald Ramp

## 2016-05-24 LAB — BASIC METABOLIC PANEL
BUN / CREAT RATIO: 10 (ref 10–24)
BUN: 15 mg/dL (ref 8–27)
CALCIUM: 9.2 mg/dL (ref 8.6–10.2)
CHLORIDE: 102 mmol/L (ref 96–106)
CO2: 26 mmol/L (ref 18–29)
Creatinine, Ser: 1.43 mg/dL — ABNORMAL HIGH (ref 0.76–1.27)
GFR calc Af Amer: 59 mL/min/{1.73_m2} — ABNORMAL LOW (ref >59)
GFR calc non Af Amer: 51 mL/min/{1.73_m2} — ABNORMAL LOW (ref >59)
GLUCOSE: 92 mg/dL (ref 65–99)
POTASSIUM: 4.5 mmol/L (ref 3.5–5.2)
Sodium: 145 mmol/L — ABNORMAL HIGH (ref 134–144)

## 2016-05-28 ENCOUNTER — Encounter: Payer: Self-pay | Admitting: Internal Medicine

## 2016-06-25 ENCOUNTER — Encounter: Payer: Self-pay | Admitting: Internal Medicine

## 2016-07-12 ENCOUNTER — Encounter: Payer: Self-pay | Admitting: Gastroenterology

## 2016-08-15 ENCOUNTER — Encounter: Payer: Self-pay | Admitting: Internal Medicine

## 2016-08-23 ENCOUNTER — Other Ambulatory Visit: Payer: Self-pay | Admitting: Internal Medicine

## 2016-08-23 DIAGNOSIS — E785 Hyperlipidemia, unspecified: Secondary | ICD-10-CM

## 2016-08-23 DIAGNOSIS — I10 Essential (primary) hypertension: Secondary | ICD-10-CM

## 2016-08-23 DIAGNOSIS — I2510 Atherosclerotic heart disease of native coronary artery without angina pectoris: Secondary | ICD-10-CM

## 2016-08-30 ENCOUNTER — Encounter: Payer: Self-pay | Admitting: Internal Medicine

## 2016-08-30 ENCOUNTER — Telehealth: Payer: Self-pay

## 2016-08-30 DIAGNOSIS — F329 Major depressive disorder, single episode, unspecified: Secondary | ICD-10-CM

## 2016-08-30 DIAGNOSIS — F32A Depression, unspecified: Secondary | ICD-10-CM

## 2016-08-30 MED ORDER — DULOXETINE HCL 30 MG PO CPEP: 30.0000 mg | ORAL_CAPSULE | Freq: Every day | ORAL | 0 refills | Status: DC

## 2016-08-30 NOTE — Telephone Encounter (Signed)
Received fax from [facility] of duloxetine.   Approved but pt needs an appt for additional refills.  Can you call pt and schedule a follow up please?

## 2016-08-30 NOTE — Telephone Encounter (Signed)
Patient is aware. He has an appointment on October 24, 2016.

## 2016-09-05 ENCOUNTER — Ambulatory Visit (AMBULATORY_SURGERY_CENTER): Payer: Self-pay | Admitting: *Deleted

## 2016-09-05 VITALS — Ht 69.5 in | Wt 257.0 lb

## 2016-09-05 DIAGNOSIS — Z1211 Encounter for screening for malignant neoplasm of colon: Secondary | ICD-10-CM

## 2016-09-05 MED ORDER — NA SULFATE-K SULFATE-MG SULF 17.5-3.13-1.6 GM/177ML PO SOLN: 1.0000 | Freq: Once | ORAL | 0 refills | Status: AC

## 2016-09-05 NOTE — Progress Notes (Signed)
No egg or soy allergy known to patient  No issues with past sedation with any surgeries  or procedures, no intubation problems  No diet pills per patient No home 02 use per patient  No blood thinners per patient  Pt denies issues with constipation  No A fib or A flutter  Pt has no e mail for [employer] video

## 2016-09-06 ENCOUNTER — Encounter: Payer: Self-pay | Admitting: Gastroenterology

## 2016-09-13 ENCOUNTER — Encounter: Payer: Self-pay | Admitting: Gastroenterology

## 2016-09-13 ENCOUNTER — Ambulatory Visit (AMBULATORY_SURGERY_CENTER): Payer: Medicare Other | Admitting: Gastroenterology

## 2016-09-13 ENCOUNTER — Encounter: Payer: Medicare Other | Admitting: Gastroenterology

## 2016-09-13 VITALS — BP 172/111 | HR 66 | Temp 98.6°F | Resp 20 | Ht 69.5 in | Wt 257.0 lb

## 2016-09-13 DIAGNOSIS — Z1212 Encounter for screening for malignant neoplasm of rectum: Secondary | ICD-10-CM | POA: Diagnosis not present

## 2016-09-13 DIAGNOSIS — D123 Benign neoplasm of transverse colon: Secondary | ICD-10-CM

## 2016-09-13 DIAGNOSIS — Z1211 Encounter for screening for malignant neoplasm of colon: Secondary | ICD-10-CM | POA: Diagnosis present

## 2016-09-13 DIAGNOSIS — D124 Benign neoplasm of descending colon: Secondary | ICD-10-CM | POA: Diagnosis not present

## 2016-09-13 LAB — HM COLONOSCOPY

## 2016-09-13 MED ORDER — SODIUM CHLORIDE 0.9 % IV SOLN: 500.0000 mL | INTRAVENOUS | Status: DC

## 2016-09-13 NOTE — Progress Notes (Signed)
Report given to PACU, vss 

## 2016-09-13 NOTE — Progress Notes (Signed)
Called to room to assist during endoscopic procedure.  Patient ID and intended procedure confirmed with present staff. Received instructions for my participation in the procedure from the performing physician.  

## 2016-09-13 NOTE — Op Note (Signed)
[facility] Patient Name: James Sawyer Procedure Date: 09/13/2016 9:01 AM MRN: [id number] Endoscopist: Mallie Mussel L. Loletha Carrow , MD Age: 66 Referring MD:  Date of Birth: 05-22-1950 Gender: Male Account #: [id number] Procedure:                Colonoscopy Indications:              Screening for colorectal malignant neoplasm Medicines:                Monitored Anesthesia Care Procedure:                Pre-Anesthesia Assessment:                           - Prior to the procedure, a History and Physical                            was performed, and patient medications and                            allergies were reviewed. The patient's tolerance of                            previous anesthesia was also reviewed. The risks                            and benefits of the procedure and the sedation                            options and risks were discussed with the patient.                            All questions were answered, and informed consent                            was obtained. Anticoagulants: The patient has taken                            aspirin. It was decided not to withhold this                            medication prior to the procedure. ASA Grade                            Assessment: II - A patient with mild systemic                            disease. After reviewing the risks and benefits,                            the patient was deemed in satisfactory condition to                            undergo the procedure.  After obtaining informed consent, the colonoscope                            was passed under direct vision. Throughout the                            procedure, the patient's blood pressure, pulse, and                            oxygen saturations were monitored continuously. The                            Colonoscope was introduced through the anus and                            advanced to the the cecum, identified by                     appendiceal orifice and ileocecal valve. The                            colonoscopy was performed without difficulty. The                            patient tolerated the procedure well. The quality                            of the bowel preparation was good. The ileocecal                            valve, appendiceal orifice, and rectum were                            photographed. The quality of the bowel preparation                            was evaluated using the BBPS [facility] Bowel                            Preparation Scale) with scores of: Right Colon = 2,                            Transverse Colon = 2 and Left Colon = 2. The total                            BBPS score equals 6. The bowel preparation used was                            SUPREP. Scope In: 9:21:06 AM Scope Out: 9:42:37 AM Scope Withdrawal Time: 0 hours 16 minutes 27 seconds  Total Procedure Duration: 0 hours 21 minutes 31 seconds  Findings:                 The digital rectal exam findings include decreased  sphincter tone.                           Four sessile polyps were found in the descending                            colon and transverse colon. The polyps were 2 to 8                            mm in size. These polyps were removed with a cold                            snare. Resection and retrieval were complete.                           Internal hemorrhoids were found during retroflexion                            and during anoscopy. The hemorrhoids were                            medium-sized and Grade I (internal hemorrhoids that                            do not prolapse).                           The exam was otherwise without abnormality on                            direct and retroflexion views. Complications:            No immediate complications. Estimated Blood Loss:     Estimated blood loss: none. Impression:               - Decreased sphincter  tone found on digital rectal                            exam.                           - Four 2 to 8 mm polyps in the descending colon and                            in the transverse colon, removed with a cold snare.                            Resected and retrieved.                           - Internal hemorrhoids.                           - The examination was otherwise normal on direct  and retroflexion views. Recommendation:           - Patient has a contact number available for                            emergencies. The signs and symptoms of potential                            delayed complications were discussed with the                            patient. Return to normal activities tomorrow.                            Written discharge instructions were provided to the                            patient.                           - Resume previous diet.                           - Continue present medications.                           - No aspirin, ibuprofen, naproxen, or other                            non-steroidal anti-inflammatory drugs for 7 days                            after polyp removal.                           - Await pathology results.                           - Repeat colonoscopy is recommended for                            surveillance. The colonoscopy date will be                            determined after pathology results from today's                            exam become available for review. Henry L. Loletha Carrow, MD 09/13/2016 9:50:11 AM This report has been signed electronically.

## 2016-09-13 NOTE — Patient Instructions (Signed)
Discharge instructions given. Handouts on polyps and hemorrhoids. No aspirin,ibuprofin,naproxen, or other non-steroidal anti-inflammatory drugs for 7 days. Resume previous medications. YOU HAD AN ENDOSCOPIC PROCEDURE TODAY AT [facility]:   Refer to the procedure report that was given to you for any specific questions about what was found during the examination.  If the procedure report does not answer your questions, please call your gastroenterologist to clarify.  If you requested that your care partner not be given the details of your procedure findings, then the procedure report has been included in a sealed envelope for you to review at your convenience later.  YOU SHOULD EXPECT: Some feelings of bloating in the abdomen. Passage of more gas than usual.  Walking can help get rid of the air that was put into your GI tract during the procedure and reduce the bloating. If you had a lower endoscopy (such as a colonoscopy or flexible sigmoidoscopy) you may notice spotting of blood in your stool or on the toilet paper. If you underwent a bowel prep for your procedure, you may not have a normal bowel movement for a few days.  Please Note:  You might notice some irritation and congestion in your nose or some drainage.  This is from the oxygen used during your procedure.  There is no need for concern and it should clear up in a day or so.  SYMPTOMS TO REPORT IMMEDIATELY:   Following lower endoscopy (colonoscopy or flexible sigmoidoscopy):  Excessive amounts of blood in the stool  Significant tenderness or worsening of abdominal pains  Swelling of the abdomen that is new, acute  Fever of 100F or higher  For urgent or emergent issues, a gastroenterologist can be reached at any hour by calling [phone].   DIET:  We do recommend a small meal at first, but then you may proceed to your regular diet.  Drink plenty of fluids but you should avoid alcoholic beverages for 24  hours.  ACTIVITY:  You should plan to take it easy for the rest of today and you should NOT DRIVE or use heavy machinery until tomorrow (because of the sedation medicines used during the test).    FOLLOW UP: Our staff will call the number listed on your records the next business day following your procedure to check on you and address any questions or concerns that you may have regarding the information given to you following your procedure. If we do not reach you, we will leave a message.  However, if you are feeling well and you are not experiencing any problems, there is no need to return our call.  We will assume that you have returned to your regular daily activities without incident.  If any biopsies were taken you will be contacted by phone or by letter within the next 1-3 weeks.  Please call us at [phone] if you have not heard about the biopsies in 3 weeks.    SIGNATURES/CONFIDENTIALITY: You and/or your care partner have signed paperwork which will be entered into your electronic medical record.  These signatures attest to the fact that that the information above on your After Visit Summary has been reviewed and is understood.  Full responsibility of the confidentiality of this discharge information lies with you and/or your care-partner.

## 2016-09-14 ENCOUNTER — Telehealth: Payer: Self-pay | Admitting: *Deleted

## 2016-09-14 NOTE — Telephone Encounter (Signed)
  Follow up Call-  Call back number 09/13/2016 09/13/2016  Post procedure Call Back phone  # [phone]  Permission to leave phone message Yes Yes  Some recent data might be hidden     Patient questions:  Do you have a fever, pain , or abdominal swelling? No. Pain Score  0 *  Have you tolerated food without any problems? Yes.    Have you been able to return to your normal activities? Yes.    Do you have any questions about your discharge instructions: Diet   No. Medications  No. Follow up visit  No.  Do you have questions or concerns about your Care? No.  Actions: * If pain score is 4 or above: No action needed, pain <4. No further bowel movements or any rectal bleeding. Patient denies abdominal swelling or pain. No fever noted by the patient. Patient informed again if he starts to have rectal bleeding(1/4 cup of blood or clots) or any other symptoms to call us. Patient agreed with this plan.

## 2016-09-14 NOTE — Telephone Encounter (Signed)
  Follow up Call-  Call back number 09/13/2016 09/13/2016  Post procedure Call Back phone  # [phone]  Permission to leave phone message Yes Yes  Some recent data might be hidden     Patient questions:  Do you have a fever, pain , or abdominal swelling? No. Pain Score  0 *  Have you tolerated food without any problems? Yes.    Have you been able to return to your normal activities? Yes.    Do you have any questions about your discharge instructions: Diet   No. Medications  No. Follow up visit  No.  Do you have questions or concerns about your Care? Yes.    Actions: * If pain score is 4 or above: No action needed, pain <4. patient stating he has had 2 bowel movements with streaks of blood in each. Requesting a call back for check prior to end of day.

## 2016-09-18 ENCOUNTER — Encounter: Payer: Self-pay | Admitting: Gastroenterology

## 2016-09-19 ENCOUNTER — Telehealth: Payer: Self-pay | Admitting: Gastroenterology

## 2016-09-19 NOTE — Telephone Encounter (Signed)
Called patient back, no answer, unable to lvm. According to colonoscopy report he needs to stay off aspirin for 7 days following polyp removal. He can start taking it again on 6/15.

## 2016-09-20 NOTE — Telephone Encounter (Signed)
Left message for patient that he can safely restart taking his aspirin on 6/15.

## 2016-09-24 ENCOUNTER — Ambulatory Visit: Payer: Medicare Other | Admitting: Internal Medicine

## 2016-10-24 ENCOUNTER — Encounter: Payer: Self-pay | Admitting: Internal Medicine

## 2016-10-24 ENCOUNTER — Ambulatory Visit (INDEPENDENT_AMBULATORY_CARE_PROVIDER_SITE_OTHER): Payer: Medicare Other | Admitting: Internal Medicine

## 2016-10-24 ENCOUNTER — Other Ambulatory Visit (INDEPENDENT_AMBULATORY_CARE_PROVIDER_SITE_OTHER): Payer: Medicare Other

## 2016-10-24 VITALS — BP 142/76 | HR 75 | Temp 97.8°F | Resp 16 | Ht 69.5 in | Wt 257.0 lb

## 2016-10-24 DIAGNOSIS — I10 Essential (primary) hypertension: Secondary | ICD-10-CM

## 2016-10-24 DIAGNOSIS — N401 Enlarged prostate with lower urinary tract symptoms: Secondary | ICD-10-CM

## 2016-10-24 DIAGNOSIS — E876 Hypokalemia: Secondary | ICD-10-CM

## 2016-10-24 DIAGNOSIS — I2510 Atherosclerotic heart disease of native coronary artery without angina pectoris: Secondary | ICD-10-CM

## 2016-10-24 DIAGNOSIS — R351 Nocturia: Secondary | ICD-10-CM | POA: Diagnosis not present

## 2016-10-24 DIAGNOSIS — I110 Hypertensive heart disease with heart failure: Secondary | ICD-10-CM

## 2016-10-24 DIAGNOSIS — G4733 Obstructive sleep apnea (adult) (pediatric): Secondary | ICD-10-CM

## 2016-10-24 DIAGNOSIS — Z0000 Encounter for general adult medical examination without abnormal findings: Secondary | ICD-10-CM | POA: Diagnosis not present

## 2016-10-24 LAB — COMPREHENSIVE METABOLIC PANEL
ALT: 30 U/L (ref 0–53)
AST: 18 U/L (ref 0–37)
Albumin: 4.2 g/dL (ref 3.5–5.2)
Alkaline Phosphatase: 78 U/L (ref 39–117)
BILIRUBIN TOTAL: 0.4 mg/dL (ref 0.2–1.2)
BUN: 21 mg/dL (ref 6–23)
CALCIUM: 9.6 mg/dL (ref 8.4–10.5)
CHLORIDE: 108 meq/L (ref 96–112)
CO2: 31 meq/L (ref 19–32)
Creatinine, Ser: 1.5 mg/dL (ref 0.40–1.50)
GFR: 60.23 mL/min (ref >60.00)
Glucose, Bld: 111 mg/dL — ABNORMAL HIGH (ref 70–99)
Potassium: 3.4 mEq/L — ABNORMAL LOW (ref 3.5–5.1)
Sodium: 144 mEq/L (ref 135–145)
Total Protein: 6.7 g/dL (ref 6.0–8.3)

## 2016-10-24 LAB — CBC WITH DIFFERENTIAL/PLATELET
BASOS PCT: 0.5 % (ref 0.0–3.0)
Basophils Absolute: 0 10*3/uL (ref 0.0–0.1)
EOS PCT: 2.2 % (ref 0.0–5.0)
Eosinophils Absolute: 0.2 10*3/uL (ref 0.0–0.7)
HEMATOCRIT: 42.8 % (ref 39.0–52.0)
Hemoglobin: 14 g/dL (ref 13.0–17.0)
LYMPHS PCT: 46.3 % — AB (ref 12.0–46.0)
Lymphs Abs: 3.4 10*3/uL (ref 0.7–4.0)
MCHC: 32.7 g/dL (ref 30.0–36.0)
MCV: 97.8 fl (ref 78.0–100.0)
MONOS PCT: 8.8 % (ref 3.0–12.0)
Monocytes Absolute: 0.7 10*3/uL (ref 0.1–1.0)
NEUTROS ABS: 3.1 10*3/uL (ref 1.4–7.7)
Neutrophils Relative %: 42.2 % — ABNORMAL LOW (ref 43.0–77.0)
PLATELETS: 458 10*3/uL — AB (ref 150.0–400.0)
RBC: 4.37 Mil/uL (ref 4.22–5.81)
RDW: 15.1 % (ref 11.5–15.5)
WBC: 7.4 10*3/uL (ref 4.0–10.5)

## 2016-10-24 LAB — PSA: PSA: 0.51 ng/mL (ref 0.10–4.00)

## 2016-10-24 MED ORDER — SILODOSIN 4 MG PO CAPS: 4.0000 mg | ORAL_CAPSULE | Freq: Every day | ORAL | 1 refills | Status: DC
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
## 2016-10-24 NOTE — Patient Instructions (Signed)

## 2016-10-25 ENCOUNTER — Telehealth: Payer: Self-pay | Admitting: Internal Medicine

## 2016-10-25 DIAGNOSIS — E876 Hypokalemia: Secondary | ICD-10-CM | POA: Insufficient documentation

## 2016-10-25 MED ORDER — POTASSIUM CHLORIDE ER 20 MEQ PO TBCR: EXTENDED_RELEASE_TABLET | ORAL | 1 refills | Status: DC

## 2016-10-25 NOTE — Telephone Encounter (Signed)
Referral sent to [facility]

## 2016-10-25 NOTE — Telephone Encounter (Signed)
[facility] called stating they are not able to see this Pt because he had a sleep study done in 2016 and was put on a CPAP machine in 2017 And they said he needs to follow up with Pulmonary

## 2016-11-09 ENCOUNTER — Telehealth: Payer: Self-pay | Admitting: Internal Medicine

## 2016-11-09 NOTE — Telephone Encounter (Signed)
Pt returned your call regarding his labs. I gave him MD response. He expressed understanding and did not have any questions at this time.  °

## 2016-11-13 ENCOUNTER — Telehealth: Payer: Self-pay | Admitting: Internal Medicine

## 2016-11-13 DIAGNOSIS — F329 Major depressive disorder, single episode, unspecified: Secondary | ICD-10-CM

## 2016-11-13 DIAGNOSIS — F32A Depression, unspecified: Secondary | ICD-10-CM

## 2016-11-13 MED ORDER — BUPROPION HCL ER (XL) 150 MG PO TB24: 150.0000 mg | ORAL_TABLET | Freq: Every day | ORAL | 1 refills | Status: DC

## 2016-11-13 NOTE — Telephone Encounter (Signed)
Pt called requesting a refill on his buPROPion (WELLBUTRIN XL) 150 MG 24 hr tablet to [facility] on [address]. Also, can the Rapaflo 4mg  be resent?

## 2016-11-13 NOTE — Telephone Encounter (Signed)
erx sent

## 2016-11-24 IMAGING — CR DG CHEST 2V
2 series · 2 of 2 positions shown · non-contrast
Comparison: 03/29/2011

CLINICAL DATA: New patient, COPD, hypertension, smoker, shortness
of breath, cough

EXAM:
CHEST  2 VIEW

[view not recorded (1 of 2)]
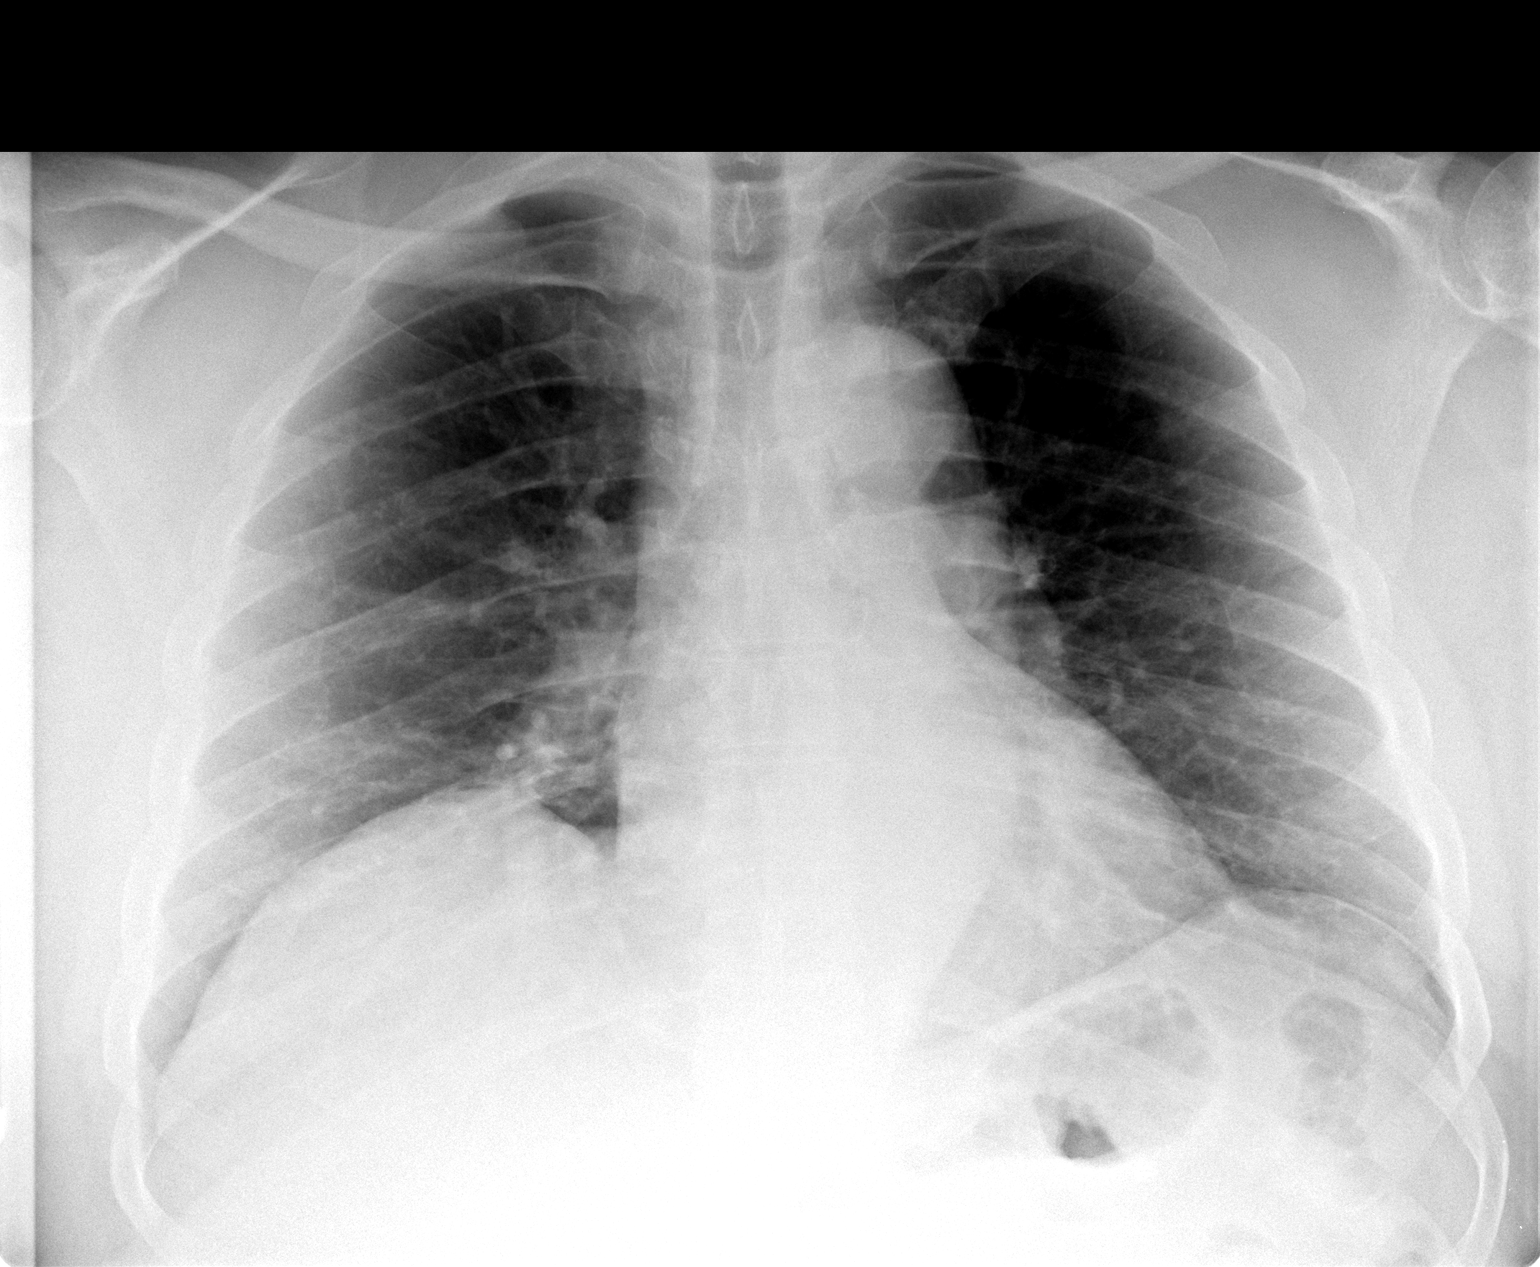

[view not recorded (2 of 2)]
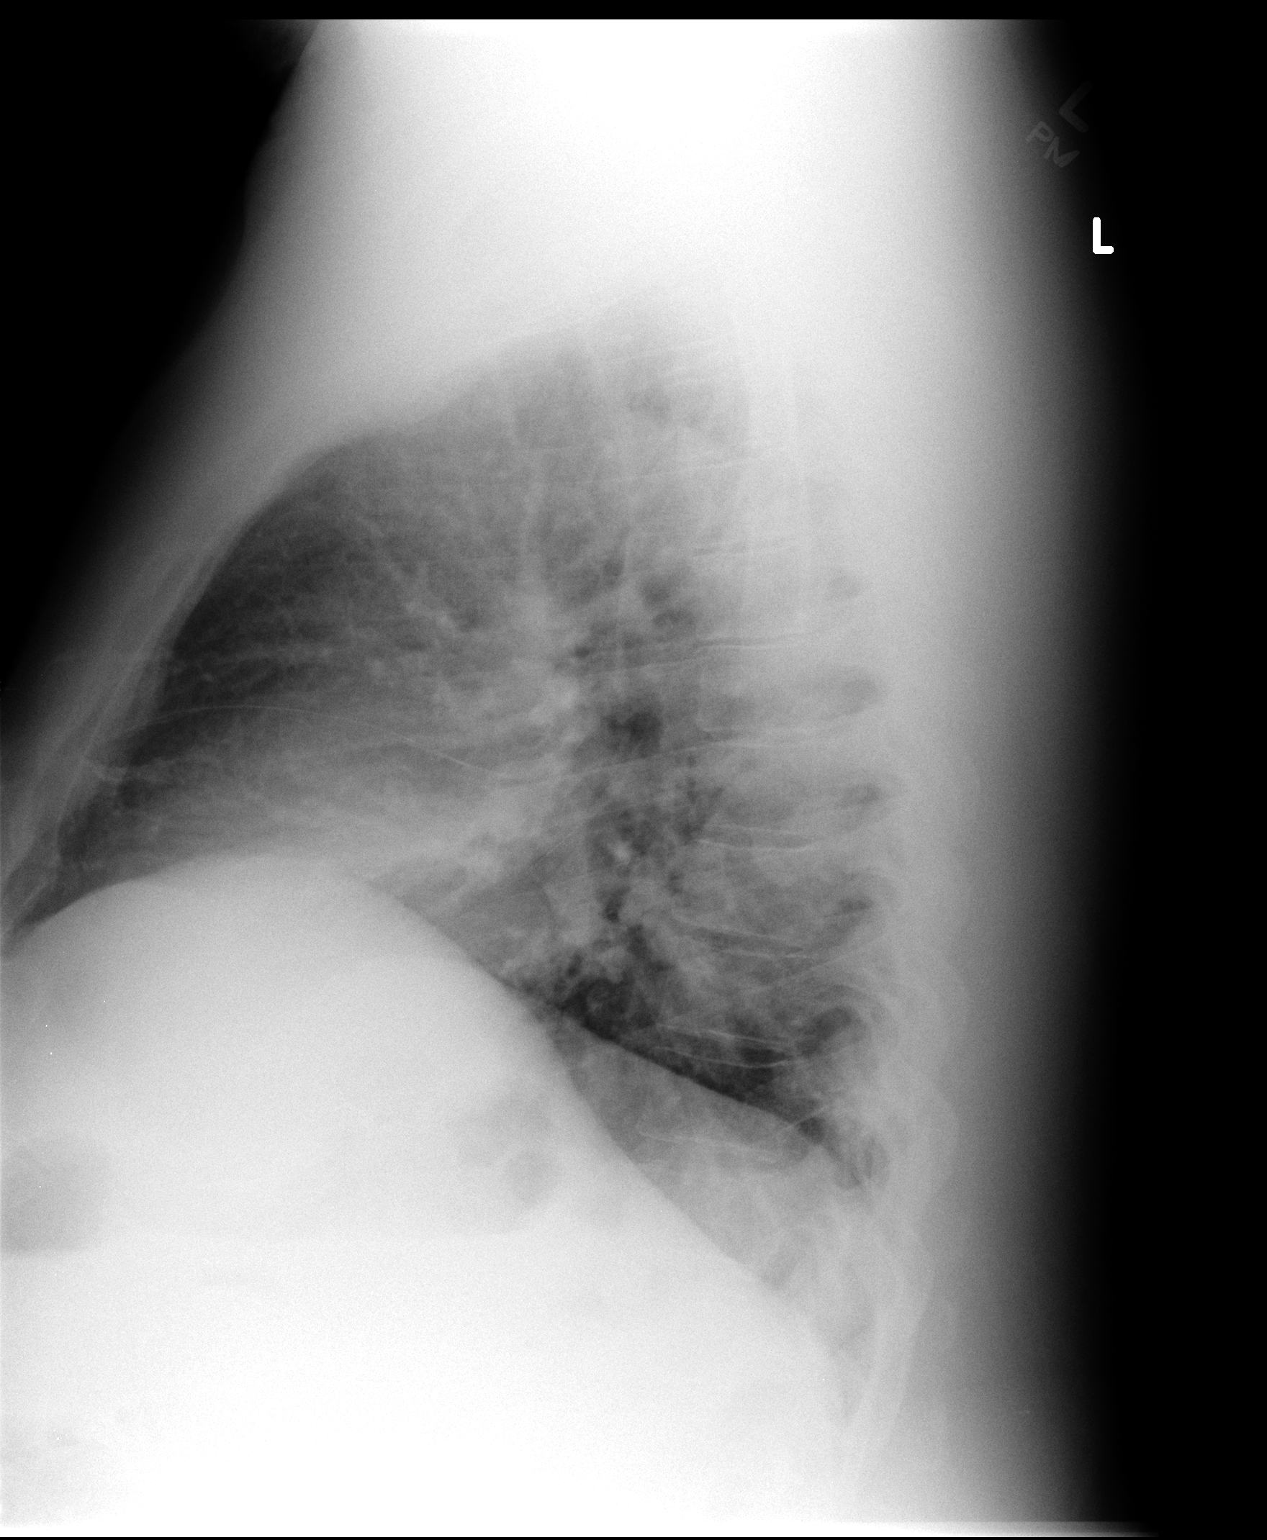

[2 of 2 positions shown; findings below may reference images not displayed]

FINDINGS: Upper normal heart size.

Tortuous aorta.

Mediastinal contours and pulmonary vascularity normal.

Minimal RIGHT basilar atelectasis decreased and previous exam.

Lungs otherwise clear.

No pleural effusion or pneumothorax.

Osseous structures unremarkable.
IMPRESSION: Decreased RIGHT basilar atelectasis versus prior study.

No acute abnormalities.

## 2016-11-30 ENCOUNTER — Ambulatory Visit (INDEPENDENT_AMBULATORY_CARE_PROVIDER_SITE_OTHER): Payer: Medicare Other | Admitting: Internal Medicine

## 2016-11-30 ENCOUNTER — Encounter: Payer: Self-pay | Admitting: Internal Medicine

## 2016-11-30 VITALS — BP 142/88 | HR 76 | Ht 69.5 in | Wt 256.8 lb

## 2016-11-30 DIAGNOSIS — I10 Essential (primary) hypertension: Secondary | ICD-10-CM

## 2016-11-30 DIAGNOSIS — I2510 Atherosclerotic heart disease of native coronary artery without angina pectoris: Secondary | ICD-10-CM

## 2016-11-30 DIAGNOSIS — E785 Hyperlipidemia, unspecified: Secondary | ICD-10-CM | POA: Diagnosis not present

## 2016-11-30 MED ORDER — AMLODIPINE BESYLATE 2.5 MG PO TABS: 2.5000 mg | ORAL_TABLET | Freq: Every day | ORAL | 3 refills | Status: DC
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
## 2016-11-30 NOTE — Patient Instructions (Signed)
Your physician has recommended you make the following change in your medication:  1.) start amlodipine 2.5 mg once daily  Your physician recommends that you return for lab work today [facility])  Your physician recommends that you schedule a follow-up appointment in: 6-8 weeks with Dr. Harrington Challenger.

## 2016-12-01 LAB — BASIC METABOLIC PANEL
BUN/Creatinine Ratio: 12 (ref 10–24)
BUN: 21 mg/dL (ref 8–27)
CHLORIDE: 102 mmol/L (ref 96–106)
CO2: 28 mmol/L (ref 20–29)
CREATININE: 1.8 mg/dL — AB (ref 0.76–1.27)
Calcium: 9.5 mg/dL (ref 8.6–10.2)
GFR calc Af Amer: 44 mL/min/{1.73_m2} — ABNORMAL LOW (ref >59)
GFR calc non Af Amer: 38 mL/min/{1.73_m2} — ABNORMAL LOW (ref >59)
GLUCOSE: 96 mg/dL (ref 65–99)
Potassium: 4.5 mmol/L (ref 3.5–5.2)
SODIUM: 148 mmol/L — AB (ref 134–144)

## 2016-12-07 ENCOUNTER — Encounter: Payer: Self-pay | Admitting: Internal Medicine
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
## 2017-01-10 ENCOUNTER — Encounter (INDEPENDENT_AMBULATORY_CARE_PROVIDER_SITE_OTHER): Payer: Self-pay

## 2017-01-10 ENCOUNTER — Encounter: Payer: Self-pay | Admitting: Internal Medicine

## 2017-01-10 ENCOUNTER — Telehealth: Payer: Self-pay | Admitting: Internal Medicine

## 2017-01-10 ENCOUNTER — Ambulatory Visit (INDEPENDENT_AMBULATORY_CARE_PROVIDER_SITE_OTHER): Payer: Medicare Other | Admitting: Internal Medicine

## 2017-01-10 VITALS — BP 126/74 | HR 60 | Ht 69.5 in | Wt 258.2 lb

## 2017-01-10 DIAGNOSIS — G4733 Obstructive sleep apnea (adult) (pediatric): Secondary | ICD-10-CM

## 2017-01-10 DIAGNOSIS — E785 Hyperlipidemia, unspecified: Secondary | ICD-10-CM

## 2017-01-10 DIAGNOSIS — I2510 Atherosclerotic heart disease of native coronary artery without angina pectoris: Secondary | ICD-10-CM

## 2017-01-10 DIAGNOSIS — I10 Essential (primary) hypertension: Secondary | ICD-10-CM | POA: Diagnosis not present

## 2017-01-10 LAB — BASIC METABOLIC PANEL
BUN/Creatinine Ratio: 10 (ref 10–24)
BUN: 14 mg/dL (ref 8–27)
CALCIUM: 9 mg/dL (ref 8.6–10.2)
CO2: 23 mmol/L (ref 20–29)
CREATININE: 1.41 mg/dL — AB (ref 0.76–1.27)
Chloride: 106 mmol/L (ref 96–106)
GFR calc Af Amer: 60 mL/min/{1.73_m2} (ref >59)
GFR calc non Af Amer: 52 mL/min/{1.73_m2} — ABNORMAL LOW (ref >59)
Glucose: 90 mg/dL (ref 65–99)
POTASSIUM: 4 mmol/L (ref 3.5–5.2)
SODIUM: 143 mmol/L (ref 134–144)

## 2017-01-10 NOTE — Patient Instructions (Signed)
Your physician recommends that you continue on your current medications as directed. Please refer to the Current Medication list given to you today. Your physician wants you to follow-up in: July, 2019 with Dr. Harrington Challenger.  You will receive a reminder letter in the mail two months in advance. If you don't receive a letter, please call our office to schedule the follow-up appointment.

## 2017-01-10 NOTE — Telephone Encounter (Signed)
New message    Patient states he was asked to call back and provide names of medication he is taking.  1. Torsemide 20mg   Once a day 2. Potassium chloride ER 71meq  twice a day

## 2017-01-11 NOTE — Telephone Encounter (Signed)
Patient was seen in office yesterday.  When he got home reviewed medications.  He wanted to add the following to his medication list:   Torsemide 20 mg once a day.  Medicines updated.  Will route to Dr. Harrington Challenger as Juluis Rainier.

## 2017-01-16 NOTE — Telephone Encounter (Signed)
Update med list   Keep on same meds

## 2017-08-21 ENCOUNTER — Other Ambulatory Visit: Payer: Self-pay | Admitting: Internal Medicine

## 2017-08-21 DIAGNOSIS — I110 Hypertensive heart disease with heart failure: Secondary | ICD-10-CM

## 2017-08-21 DIAGNOSIS — R600 Localized edema: Secondary | ICD-10-CM
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
## 2017-10-31 ENCOUNTER — Ambulatory Visit (INDEPENDENT_AMBULATORY_CARE_PROVIDER_SITE_OTHER): Payer: Medicare Other | Admitting: *Deleted

## 2017-10-31 ENCOUNTER — Telehealth: Payer: Self-pay | Admitting: *Deleted

## 2017-10-31 VITALS — BP 144/85 | HR 64 | Ht 70.0 in | Wt 261.0 lb

## 2017-10-31 DIAGNOSIS — Z0000 Encounter for general adult medical examination without abnormal findings: Secondary | ICD-10-CM

## 2017-10-31 NOTE — Patient Instructions (Addendum)
Continue doing brain stimulating activities (puzzles, reading, adult coloring books, staying active) to keep memory sharp.   Continue to eat heart healthy diet (full of fruits, vegetables, whole grains, lean protein, water--limit salt, fat, and sugar intake) and increase physical activity as tolerated.   [name] , Thank you for taking time to come for your Medicare Wellness Visit. I appreciate your ongoing commitment to your health goals. Please review the following plan we discussed and let me know if I can assist you in the future.   These are the goals we discussed: Goals    . Patient Stated     I will not drink as much soda and sugary drinks.        This is a list of the screening recommended for you and due dates:  Health Maintenance  Topic Date Due  . Flu Shot  11/07/2017  . Colon Cancer Screening  09/14/2019  . Tetanus Vaccine  11/15/2019  . Pneumonia vaccines (2 of 2 - PPSV23) 09/20/2020  .  Hepatitis C: One time screening is recommended by Center for Disease Control  (CDC) for  adults born from [birth year] through 1965.   Completed    Fat and Cholesterol Restricted Diet Getting too much fat and cholesterol in your diet may cause health problems. Following this diet helps keep your fat and cholesterol at normal levels. This can keep you from getting sick. What types of fat should I choose?  Choose monosaturated and polyunsaturated fats. These are found in foods such as olive oil, canola oil, flaxseeds, walnuts, almonds, and seeds.  Eat more omega-3 fats. Good choices include salmon, mackerel, sardines, tuna, flaxseed oil, and ground flaxseeds.  Limit saturated fats. These are in animal products such as meats, butter, and cream. They can also be in plant products such as palm oil, palm kernel oil, and coconut oil.  Avoid foods with partially hydrogenated oils in them. These contain trans fats. Examples of foods that have trans fats are stick margarine, some tub margarines,  cookies, crackers, and other baked goods. What general guidelines do I need to follow?  Check food labels. Look for the words "trans fat" and "saturated fat."  When preparing a meal: ? Fill half of your plate with vegetables and green salads. ? Fill one fourth of your plate with whole grains. Look for the word "whole" as the first word in the ingredient list. ? Fill one fourth of your plate with lean protein foods.  Eat more foods that have fiber, like apples, carrots, beans, peas, and barley.  Eat more home-cooked foods. Eat less at restaurants and buffets.  Limit or avoid alcohol.  Limit foods high in starch and sugar.  Limit fried foods.  Cook foods without frying them. Baking, boiling, grilling, and broiling are all great options.  Lose weight if you are overweight. Losing even a small amount of weight can help your overall health. It can also help prevent diseases such as diabetes and heart disease. What foods can I eat? Grains Whole grains, such as whole wheat or whole grain breads, crackers, cereals, and pasta. Unsweetened oatmeal, bulgur, barley, quinoa, or brown rice. Corn or whole wheat flour tortillas. Vegetables Fresh or frozen vegetables (raw, steamed, roasted, or grilled). Green salads. Fruits All fresh, canned (in natural juice), or frozen fruits. Meat and Other Protein Products Ground beef (85% or leaner), grass-fed beef, or beef trimmed of fat. Skinless chicken or [place]. Ground chicken or [place]. Pork trimmed of fat. All fish and  seafood. Eggs. Dried beans, peas, or lentils. Unsalted nuts or seeds. Unsalted canned or dry beans. Dairy Low-fat dairy products, such as skim or 1% milk, 2% or reduced-fat cheeses, low-fat ricotta or cottage cheese, or plain low-fat yogurt. Fats and Oils Tub margarines without trans fats. Light or reduced-fat mayonnaise and salad dressings. Avocado. Olive, canola, sesame, or safflower oils. Natural peanut or almond butter (choose ones  without added sugar and oil). The items listed above may not be a complete list of recommended foods or beverages. Contact your dietitian for more options. What foods are not recommended? Grains White bread. White pasta. White rice. Cornbread. Bagels, pastries, and croissants. Crackers that contain trans fat. Vegetables White potatoes. Corn. Creamed or fried vegetables. Vegetables in a cheese sauce. Fruits Dried fruits. Canned fruit in light or heavy syrup. Fruit juice. Meat and Other Protein Products Fatty cuts of meat. Ribs, chicken wings, bacon, sausage, bologna, salami, chitterlings, fatback, hot dogs, bratwurst, and packaged luncheon meats. Liver and organ meats. Dairy Whole or 2% milk, cream, half-and-half, and cream cheese. Whole milk cheeses. Whole-fat or sweetened yogurt. Full-fat cheeses. Nondairy creamers and whipped toppings. Processed cheese, cheese spreads, or cheese curds. Sweets and Desserts Corn syrup, sugars, honey, and molasses. Candy. Jam and jelly. Syrup. Sweetened cereals. Cookies, pies, cakes, donuts, muffins, and ice cream. Fats and Oils Butter, stick margarine, lard, shortening, ghee, or bacon fat. Coconut, palm kernel, or palm oils. Beverages Alcohol. Sweetened drinks (such as sodas, lemonade, and fruit drinks or punches). The items listed above may not be a complete list of foods and beverages to avoid. Contact your dietitian for more information. This information is not intended to replace advice given to you by your health care provider. Make sure you discuss any questions you have with your health care provider. Document Released: 09/25/2011 Document Revised: 12/01/2015 Document Reviewed: 06/25/2013 Elsevier Interactive Patient Education  2018 [place] DASH stands for "Dietary Approaches to Stop Hypertension." The DASH eating plan is a healthy eating plan that has been shown to reduce high blood pressure (hypertension). It may also reduce  your risk for type 2 diabetes, heart disease, and stroke. The DASH eating plan may also help with weight loss. What are tips for following this plan? General guidelines  Avoid eating more than 2,300 mg (milligrams) of salt (sodium) a day. If you have hypertension, you may need to reduce your sodium intake to 1,500 mg a day.  Limit alcohol intake to no more than 1 drink a day for nonpregnant women and 2 drinks a day for men. One drink equals 12 oz of beer, 5 oz of [place]outer, or 1 oz of hard liquor.  Work with your health care provider to maintain a healthy body weight or to lose weight. Ask what an ideal weight is for you.  Get at least 30 minutes of exercise that causes your heart to beat faster (aerobic exercise) most days of the week. Activities may include walking, swimming, or biking.  Work with your health care provider or diet and nutrition specialist (dietitian) to adjust your eating plan to your individual calorie needs. Reading food labels  Check food labels for the amount of sodium per serving. Choose foods with less than 5 percent of the Daily Value of sodium. Generally, foods with less than 300 mg of sodium per serving fit into this eating plan.  To find whole grains, look for the word "whole" as the first word in the ingredient list. Shopping  Buy  products labeled as "low-sodium" or "no salt added."  Buy fresh foods. Avoid canned foods and premade or frozen meals. Cooking  Avoid adding salt when cooking. Use salt-free seasonings or herbs instead of table salt or sea salt. Check with your health care provider or pharmacist before using salt substitutes.  Do not fry foods. Cook foods using healthy methods such as baking, boiling, grilling, and broiling instead.  Cook with heart-healthy oils, such as olive, canola, soybean, or sunflower oil. Meal planning   Eat a balanced diet that includes: ? 5 or more servings of fruits and vegetables each day. At each meal, try to fill half  of your plate with fruits and vegetables. ? Up to 6-8 servings of whole grains each day. ? Less than 6 oz of lean meat, poultry, or fish each day. A 3-oz serving of meat is about the same size as a deck of cards. One egg equals 1 oz. ? 2 servings of low-fat dairy each day. ? A serving of nuts, seeds, or beans 5 times each week. ? Heart-healthy fats. Healthy fats called Omega-3 fatty acids are found in foods such as flaxseeds and coldwater fish, like sardines, salmon, and mackerel.  Limit how much you eat of the following: ? Canned or prepackaged foods. ? Food that is high in trans fat, such as fried foods. ? Food that is high in saturated fat, such as fatty meat. ? Sweets, desserts, sugary drinks, and other foods with added sugar. ? Full-fat dairy products.  Do not salt foods before eating.  Try to eat at least 2 vegetarian meals each week.  Eat more home-cooked food and less restaurant, buffet, and fast food.  When eating at a restaurant, ask that your food be prepared with less salt or no salt, if possible. What foods are recommended? The items listed may not be a complete list. Talk with your dietitian about what dietary choices are best for you. Grains Whole-grain or whole-wheat bread. Whole-grain or whole-wheat pasta. Brown rice. [place]. Bulgur. Whole-grain and low-sodium cereals. Pita bread. Low-fat, low-sodium crackers. Whole-wheat flour tortillas. Vegetables Fresh or frozen vegetables (raw, steamed, roasted, or grilled). Low-sodium or reduced-sodium tomato and vegetable juice. Low-sodium or reduced-sodium tomato sauce and tomato paste. Low-sodium or reduced-sodium canned vegetables. Fruits All fresh, dried, or frozen fruit. Canned fruit in natural juice (without added sugar). Meat and other protein foods Skinless chicken or [place]. Ground chicken or [place]. Pork with fat trimmed off. Fish and seafood. Egg whites. Dried beans, peas, or lentils. Unsalted nuts, nut  butters, and seeds. Unsalted canned beans. Lean cuts of beef with fat trimmed off. Low-sodium, lean deli meat. Dairy Low-fat (1%) or fat-free (skim) milk. Fat-free, low-fat, or reduced-fat cheeses. Nonfat, low-sodium ricotta or cottage cheese. Low-fat or nonfat yogurt. Low-fat, low-sodium cheese. Fats and oils Soft margarine without trans fats. Vegetable oil. Low-fat, reduced-fat, or light mayonnaise and salad dressings (reduced-sodium). Canola, safflower, olive, soybean, and sunflower oils. Avocado. Seasoning and other foods Herbs. Spices. Seasoning mixes without salt. Unsalted popcorn and pretzels. Fat-free sweets. What foods are not recommended? The items listed may not be a complete list. Talk with your dietitian about what dietary choices are best for you. Grains Baked goods made with fat, such as croissants, muffins, or some breads. Dry pasta or rice meal packs. Vegetables Creamed or fried vegetables. Vegetables in a cheese sauce. Regular canned vegetables (not low-sodium or reduced-sodium). Regular canned tomato sauce and paste (not low-sodium or reduced-sodium). Regular tomato and vegetable juice (not  low-sodium or reduced-sodium). Angie Fava. Olives. Fruits Canned fruit in a light or heavy syrup. Fried fruit. Fruit in cream or butter sauce. Meat and other protein foods Fatty cuts of meat. Ribs. Fried meat. Berniece Salines. Sausage. Bologna and other processed lunch meats. Salami. Fatback. Hotdogs. Bratwurst. Salted nuts and seeds. Canned beans with added salt. Canned or smoked fish. Whole eggs or egg yolks. Chicken or [place] with skin. Dairy Whole or 2% milk, cream, and half-and-half. Whole or full-fat cream cheese. Whole-fat or sweetened yogurt. Full-fat cheese. Nondairy creamers. Whipped toppings. Processed cheese and cheese spreads. Fats and oils Butter. Stick margarine. Lard. Shortening. Ghee. Bacon fat. Tropical oils, such as coconut, palm kernel, or palm oil. Seasoning and other foods Salted  popcorn and pretzels. Onion salt, garlic salt, seasoned salt, table salt, and sea salt. Worcestershire sauce. Tartar sauce. Barbecue sauce. Teriyaki sauce. Soy sauce, including reduced-sodium. Steak sauce. Canned and packaged gravies. Fish sauce. Oyster sauce. Cocktail sauce. Horseradish that you find on the shelf. Ketchup. Mustard. Meat flavorings and tenderizers. Bouillon cubes. Hot sauce and Tabasco sauce. Premade or packaged marinades. Premade or packaged taco seasonings. Relishes. Regular salad dressings. Where to find more information:  National Heart, Lung, and [place]: https://wilson-eaton.com/  American Heart Association: www.heart.org Summary  The DASH eating plan is a healthy eating plan that has been shown to reduce high blood pressure (hypertension). It may also reduce your risk for type 2 diabetes, heart disease, and stroke.  With the DASH eating plan, you should limit salt (sodium) intake to 2,300 mg a day. If you have hypertension, you may need to reduce your sodium intake to 1,500 mg a day.  When on the DASH eating plan, aim to eat more fresh fruits and vegetables, whole grains, lean proteins, low-fat dairy, and heart-healthy fats.  Work with your health care provider or diet and nutrition specialist (dietitian) to adjust your eating plan to your individual calorie needs. This information is not intended to replace advice given to you by your health care provider. Make sure you discuss any questions you have with your health care provider. Document Released: 03/15/2011 Document Revised: 03/19/2016 Document Reviewed: 03/19/2016 Elsevier Interactive Patient Education  [employer].

## 2017-10-31 NOTE — Telephone Encounter (Signed)
During AWV, patient explained that PCP gave him cialis samples in the past. Patient states that the medication was beneficial and asked if he could receive more samples as he cannot afford to buy this medication.

## 2017-11-19 ENCOUNTER — Telehealth: Payer: Self-pay | Admitting: *Deleted

## 2017-11-19 NOTE — Telephone Encounter (Signed)
Called patient back in response to his CRM message to nurse. Patient needs resources to help locate a place to live. Nurse provided a resource over the phone and stated she will search further and contact patient back in several days to hopefully give more resources to assist him with his housing search.

## 2017-11-27 ENCOUNTER — Encounter: Payer: Self-pay | Admitting: Internal Medicine

## 2017-11-27 ENCOUNTER — Ambulatory Visit (INDEPENDENT_AMBULATORY_CARE_PROVIDER_SITE_OTHER): Payer: Medicare Other | Admitting: Internal Medicine

## 2017-11-27 ENCOUNTER — Other Ambulatory Visit (INDEPENDENT_AMBULATORY_CARE_PROVIDER_SITE_OTHER): Payer: Medicare Other

## 2017-11-27 VITALS — BP 162/96 | HR 64 | Temp 98.3°F | Resp 16 | Ht 70.0 in | Wt 259.5 lb

## 2017-11-27 DIAGNOSIS — N401 Enlarged prostate with lower urinary tract symptoms: Secondary | ICD-10-CM

## 2017-11-27 DIAGNOSIS — R739 Hyperglycemia, unspecified: Secondary | ICD-10-CM

## 2017-11-27 DIAGNOSIS — I2510 Atherosclerotic heart disease of native coronary artery without angina pectoris: Secondary | ICD-10-CM | POA: Diagnosis not present

## 2017-11-27 DIAGNOSIS — Z0001 Encounter for general adult medical examination with abnormal findings: Secondary | ICD-10-CM

## 2017-11-27 DIAGNOSIS — I10 Essential (primary) hypertension: Secondary | ICD-10-CM | POA: Diagnosis not present

## 2017-11-27 DIAGNOSIS — R351 Nocturia: Secondary | ICD-10-CM

## 2017-11-27 DIAGNOSIS — E785 Hyperlipidemia, unspecified: Secondary | ICD-10-CM | POA: Diagnosis not present

## 2017-11-27 DIAGNOSIS — E559 Vitamin D deficiency, unspecified: Secondary | ICD-10-CM

## 2017-11-27 DIAGNOSIS — N181 Chronic kidney disease, stage 1: Secondary | ICD-10-CM

## 2017-11-27 DIAGNOSIS — I119 Hypertensive heart disease without heart failure: Secondary | ICD-10-CM

## 2017-11-27 DIAGNOSIS — Z0000 Encounter for general adult medical examination without abnormal findings: Secondary | ICD-10-CM

## 2017-11-27 LAB — CBC WITH DIFFERENTIAL/PLATELET
BASOS ABS: 0 10*3/uL (ref 0.0–0.1)
BASOS PCT: 0.7 % (ref 0.0–3.0)
EOS ABS: 0.2 10*3/uL (ref 0.0–0.7)
Eosinophils Relative: 2.6 % (ref 0.0–5.0)
HCT: 43.4 % (ref 39.0–52.0)
HEMOGLOBIN: 14.5 g/dL (ref 13.0–17.0)
LYMPHS PCT: 41.6 % (ref 12.0–46.0)
Lymphs Abs: 2.6 10*3/uL (ref 0.7–4.0)
MCHC: 33.3 g/dL (ref 30.0–36.0)
MCV: 96.5 fl (ref 78.0–100.0)
Monocytes Absolute: 0.6 10*3/uL (ref 0.1–1.0)
Monocytes Relative: 9.7 % (ref 3.0–12.0)
Neutro Abs: 2.8 10*3/uL (ref 1.4–7.7)
Neutrophils Relative %: 45.4 % (ref 43.0–77.0)
Platelets: 449 10*3/uL — ABNORMAL HIGH (ref 150.0–400.0)
RBC: 4.5 Mil/uL (ref 4.22–5.81)
RDW: 14.2 % (ref 11.5–15.5)
WBC: 6.1 10*3/uL (ref 4.0–10.5)

## 2017-11-27 LAB — URINALYSIS, ROUTINE W REFLEX MICROSCOPIC
BILIRUBIN URINE: NEGATIVE
LEUKOCYTES UA: NEGATIVE
Nitrite: NEGATIVE
RBC / HPF: NONE SEEN (ref 0–?)
Specific Gravity, Urine: 1.02 (ref 1.000–1.030)
Total Protein, Urine: 30 — AB
URINE GLUCOSE: NEGATIVE
UROBILINOGEN UA: 0.2 (ref 0.0–1.0)
pH: 6 (ref 5.0–8.0)

## 2017-11-27 LAB — COMPREHENSIVE METABOLIC PANEL
ALBUMIN: 4 g/dL (ref 3.5–5.2)
ALT: 23 U/L (ref 0–53)
AST: 15 U/L (ref 0–37)
Alkaline Phosphatase: 80 U/L (ref 39–117)
BILIRUBIN TOTAL: 0.4 mg/dL (ref 0.2–1.2)
BUN: 17 mg/dL (ref 6–23)
CO2: 31 mEq/L (ref 19–32)
CREATININE: 1.49 mg/dL (ref 0.40–1.50)
Calcium: 9.5 mg/dL (ref 8.4–10.5)
Chloride: 106 mEq/L (ref 96–112)
GFR: 60.49 mL/min (ref >60.00)
Glucose, Bld: 93 mg/dL (ref 70–99)
Potassium: 3.8 mEq/L (ref 3.5–5.1)
SODIUM: 144 meq/L (ref 135–145)
Total Protein: 6.8 g/dL (ref 6.0–8.3)

## 2017-11-27 LAB — LIPID PANEL
CHOL/HDL RATIO: 5
CHOLESTEROL: 179 mg/dL (ref 0–200)
HDL: 38.1 mg/dL — ABNORMAL LOW (ref >39.00)
LDL Cholesterol: 101 mg/dL — ABNORMAL HIGH (ref 0–99)
NonHDL: 140.73
Triglycerides: 200 mg/dL — ABNORMAL HIGH (ref 0.0–149.0)
VLDL: 40 mg/dL (ref 0.0–40.0)

## 2017-11-27 LAB — VITAMIN D 25 HYDROXY (VIT D DEFICIENCY, FRACTURES): VITD: 6.22 ng/mL — AB (ref 30.00–100.00)

## 2017-11-27 LAB — HEMOGLOBIN A1C: HEMOGLOBIN A1C: 6.1 % (ref 4.6–6.5)

## 2017-11-27 LAB — PSA: PSA: 0.52 ng/mL (ref 0.10–4.00)

## 2017-11-27 LAB — TSH: TSH: 1.77 u[IU]/mL (ref 0.35–4.50)

## 2017-11-27 MED ORDER — AZILSARTAN-CHLORTHALIDONE 40-12.5 MG PO TABS: 1.0000 | ORAL_TABLET | Freq: Every day | ORAL | 1 refills | Status: DC

## 2017-11-27 MED ORDER — AMLODIPINE BESYLATE 2.5 MG PO TABS: 2.5000 mg | ORAL_TABLET | Freq: Every day | ORAL | 1 refills | Status: DC

## 2017-11-27 MED ORDER — ATORVASTATIN CALCIUM 40 MG PO TABS: 40.0000 mg | ORAL_TABLET | Freq: Every day | ORAL | 1 refills | Status: DC

## 2017-11-27 MED ORDER — CHOLECALCIFEROL 1.25 MG (50000 UT) PO CAPS: 50000.0000 [IU] | ORAL_CAPSULE | ORAL | 1 refills | Status: DC

## 2017-11-27 MED ORDER — ASPIRIN 81 MG PO TABS: 81.0000 mg | ORAL_TABLET | Freq: Every day | ORAL | 1 refills | Status: DC

## 2017-11-27 NOTE — Patient Instructions (Signed)
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
## 2017-11-28 ENCOUNTER — Telehealth: Payer: Self-pay

## 2017-11-28 DIAGNOSIS — I10 Essential (primary) hypertension: Secondary | ICD-10-CM

## 2017-11-28 DIAGNOSIS — I2510 Atherosclerotic heart disease of native coronary artery without angina pectoris: Secondary | ICD-10-CM

## 2017-11-28 DIAGNOSIS — I119 Hypertensive heart disease without heart failure: Secondary | ICD-10-CM

## 2017-11-28 MED ORDER — IRBESARTAN-HYDROCHLOROTHIAZIDE 300-12.5 MG PO TABS: 1.0000 | ORAL_TABLET | Freq: Every day | ORAL | 1 refills | Status: DC

## 2017-11-28 MED ORDER — AZILSARTAN-CHLORTHALIDONE 40-12.5 MG PO TABS: 1.0000 | ORAL_TABLET | Freq: Every day | ORAL | 1 refills | Status: DC

## 2017-11-28 NOTE — Telephone Encounter (Signed)
RX changed and sent. 

## 2017-11-28 NOTE — Telephone Encounter (Signed)
[facility] sent fax stating the rx was over $335. Pt contacted and pt agreed to try [facility] to see what the price is with them.  Pt will call back and let me know if he can or can not afford the mail order price.

## 2017-11-28 NOTE — Telephone Encounter (Signed)
Pt called back after checking with mail order pharmacy.  Pt states he will not be able to afford this until at least next month, if then.   Pt wants to know what to do now.

## 2017-11-28 NOTE — Telephone Encounter (Signed)
James Sawyer is still too expensive, even from the mail order. Is there an alternative that can be sent in? Please advise

## 2017-11-29 ENCOUNTER — Telehealth: Payer: Self-pay | Admitting: Internal Medicine

## 2017-11-29 NOTE — Telephone Encounter (Signed)
Patient came into the office and asked if you would call him. He did not mention what it was about.  Thanks!

## 2017-11-29 NOTE — Telephone Encounter (Signed)
Called patient back in response to phone message that was received today stating that patient requested a call-back from nurse. Nurse allowed the phone to ring numerous times and no one picked up. There was no opportunity to leave a VM. Nurse will call patient back at a later date.

## 2017-11-29 NOTE — Telephone Encounter (Signed)
Left detailed message for pt with nex rx name and to call back with any questions.

## 2018-01-28 ENCOUNTER — Telehealth: Payer: Self-pay | Admitting: Internal Medicine

## 2018-01-28 NOTE — Telephone Encounter (Signed)
Copied from [place] [id number]. Topic: Quick Communication - See Telephone Encounter >> Jan 28, 2018  3:33 PM Alfredia Ferguson R wrote: Patient is calling in wanting a refill of Azilsartan-Chlorthalidone (EDARBYCLOR) 40-12.5 MG TABS he states he gets them from the office and not a pharmacy. He stated he received samples for a months worth he received about 4-5 bottles

## 2018-02-04 NOTE — Telephone Encounter (Signed)
Left detailed message samples are up front for him to pick up.

## 2018-05-21 ENCOUNTER — Other Ambulatory Visit: Payer: Self-pay | Admitting: Internal Medicine

## 2018-05-21 DIAGNOSIS — E559 Vitamin D deficiency, unspecified: Secondary | ICD-10-CM

## 2018-08-19 ENCOUNTER — Encounter: Payer: Self-pay | Admitting: Internal Medicine

## 2018-08-19 ENCOUNTER — Ambulatory Visit (INDEPENDENT_AMBULATORY_CARE_PROVIDER_SITE_OTHER)
Admission: RE | Admit: 2018-08-19 | Discharge: 2018-08-19 | Disposition: A | Discharge status: Home / Self Care | Payer: Medicare HMO | Source: Ambulatory Visit | Attending: Internal Medicine | Admitting: Internal Medicine

## 2018-08-19 ENCOUNTER — Other Ambulatory Visit (INDEPENDENT_AMBULATORY_CARE_PROVIDER_SITE_OTHER): Payer: Medicare HMO

## 2018-08-19 ENCOUNTER — Other Ambulatory Visit: Payer: Self-pay

## 2018-08-19 ENCOUNTER — Ambulatory Visit (INDEPENDENT_AMBULATORY_CARE_PROVIDER_SITE_OTHER): Payer: Medicare HMO | Admitting: Internal Medicine

## 2018-08-19 VITALS — BP 162/102 | HR 75 | Temp 98.0°F | Resp 16 | Ht 70.0 in | Wt 256.0 lb

## 2018-08-19 DIAGNOSIS — D75839 Thrombocytosis, unspecified: Secondary | ICD-10-CM

## 2018-08-19 DIAGNOSIS — I10 Essential (primary) hypertension: Secondary | ICD-10-CM

## 2018-08-19 DIAGNOSIS — E785 Hyperlipidemia, unspecified: Secondary | ICD-10-CM | POA: Diagnosis not present

## 2018-08-19 DIAGNOSIS — R042 Hemoptysis: Secondary | ICD-10-CM | POA: Diagnosis not present

## 2018-08-19 DIAGNOSIS — I2510 Atherosclerotic heart disease of native coronary artery without angina pectoris: Secondary | ICD-10-CM | POA: Diagnosis not present

## 2018-08-19 DIAGNOSIS — R7303 Prediabetes: Secondary | ICD-10-CM

## 2018-08-19 DIAGNOSIS — D473 Essential (hemorrhagic) thrombocythemia: Secondary | ICD-10-CM | POA: Diagnosis not present

## 2018-08-19 DIAGNOSIS — I119 Hypertensive heart disease without heart failure: Secondary | ICD-10-CM | POA: Diagnosis not present

## 2018-08-19 LAB — LIPID PANEL
Cholesterol: 138 mg/dL (ref 0–200)
HDL: 46.9 mg/dL (ref >39.00)
LDL Cholesterol: 73 mg/dL (ref 0–99)
NonHDL: 90.84
Total CHOL/HDL Ratio: 3
Triglycerides: 91 mg/dL (ref 0.0–149.0)
VLDL: 18.2 mg/dL (ref 0.0–40.0)

## 2018-08-19 LAB — COMPREHENSIVE METABOLIC PANEL
ALT: 27 U/L (ref 0–53)
AST: 19 U/L (ref 0–37)
Albumin: 4.2 g/dL (ref 3.5–5.2)
Alkaline Phosphatase: 69 U/L (ref 39–117)
BUN: 12 mg/dL (ref 6–23)
CO2: 31 mEq/L (ref 19–32)
Calcium: 9 mg/dL (ref 8.4–10.5)
Chloride: 106 mEq/L (ref 96–112)
Creatinine, Ser: 1.32 mg/dL (ref 0.40–1.50)
GFR: 65.31 mL/min (ref >60.00)
Glucose, Bld: 109 mg/dL — ABNORMAL HIGH (ref 70–99)
Potassium: 3.6 mEq/L (ref 3.5–5.1)
Sodium: 145 mEq/L (ref 135–145)
Total Bilirubin: 0.5 mg/dL (ref 0.2–1.2)
Total Protein: 7.1 g/dL (ref 6.0–8.3)

## 2018-08-19 LAB — CBC WITH DIFFERENTIAL/PLATELET
Basophils Absolute: 0 10*3/uL (ref 0.0–0.1)
Basophils Relative: 0.4 % (ref 0.0–3.0)
Eosinophils Absolute: 0.1 10*3/uL (ref 0.0–0.7)
Eosinophils Relative: 1.7 % (ref 0.0–5.0)
HCT: 43.6 % (ref 39.0–52.0)
Hemoglobin: 14.7 g/dL (ref 13.0–17.0)
Lymphocytes Relative: 35.1 % (ref 12.0–46.0)
Lymphs Abs: 2.1 10*3/uL (ref 0.7–4.0)
MCHC: 33.6 g/dL (ref 30.0–36.0)
MCV: 95.6 fl (ref 78.0–100.0)
Monocytes Absolute: 0.5 10*3/uL (ref 0.1–1.0)
Monocytes Relative: 8 % (ref 3.0–12.0)
Neutro Abs: 3.2 10*3/uL (ref 1.4–7.7)
Neutrophils Relative %: 54.8 % (ref 43.0–77.0)
Platelets: 433 10*3/uL — ABNORMAL HIGH (ref 150.0–400.0)
RBC: 4.56 Mil/uL (ref 4.22–5.81)
RDW: 15.3 % (ref 11.5–15.5)
WBC: 5.9 10*3/uL (ref 4.0–10.5)

## 2018-08-19 LAB — VITAMIN D 25 HYDROXY (VIT D DEFICIENCY, FRACTURES): VITD: 44.64 ng/mL (ref 30.00–100.00)

## 2018-08-19 LAB — SEDIMENTATION RATE: Sed Rate: 10 mm/hr (ref 0–20)

## 2018-08-19 LAB — PROTIME-INR
INR: 1 ratio (ref 0.8–1.0)
Prothrombin Time: 11.7 s (ref 9.6–13.1)

## 2018-08-19 LAB — IBC PANEL
Iron: 91 ug/dL (ref 42–165)
Saturation Ratios: 25.1 % (ref 20.0–50.0)
Transferrin: 259 mg/dL (ref 212.0–360.0)

## 2018-08-19 LAB — FOLATE: Folate: 8.1 ng/mL (ref >5.9)

## 2018-08-19 LAB — APTT: aPTT: 39.3 s — ABNORMAL HIGH (ref 23.4–32.7)

## 2018-08-19 LAB — FERRITIN: Ferritin: 34.6 ng/mL (ref 22.0–322.0)

## 2018-08-19 LAB — VITAMIN B12: Vitamin B-12: 230 pg/mL (ref 211–911)

## 2018-08-19 LAB — TSH: TSH: 3.08 u[IU]/mL (ref 0.35–4.50)

## 2018-08-19 LAB — HEMOGLOBIN A1C: Hgb A1c MFr Bld: 6.3 % (ref 4.6–6.5)

## 2018-08-19 MED ORDER — AZILSARTAN-CHLORTHALIDONE 40-25 MG PO TABS: 1.0000 | ORAL_TABLET | Freq: Every day | ORAL | 0 refills | Status: DC

## 2018-08-19 NOTE — Patient Instructions (Signed)

## 2018-08-19 NOTE — Progress Notes (Signed)
Subjective:  Patient ID: James Sawyer, male    DOB: 1950-12-17  Age: 68 y.o. MRN: [id number]  CC: Hypertension and Hyperlipidemia   HPI SHAY BARTOLI presents for f/up - He complains that 1 day prior to this visit he either spit up or coughed up a couple of flecks of blood.  This happened 24 hours ago and has not recurred.  He denies chest pain, shortness of breath, fever, chills, night sweats, weight loss, dizziness, no lightheadedness, or near syncope.  He tells me he is taking his antihypertensives but according to his prescription refills he would have run out of his antihypertensive several months ago.  Outpatient Medications Prior to Visit  Medication Sig Dispense Refill  . aspirin 81 MG tablet Take 1 tablet (81 mg total) by mouth daily. 90 tablet 1  . atorvastatin (LIPITOR) 40 MG tablet Take 1 tablet (40 mg total) by mouth daily. 90 tablet 1  . OPTIMAL-D 1.25 MG (50000 UT) capsule TAKE 1 CAPSULE BY MOUTH ONCE A WEEK 12 capsule 0  . Potassium Chloride ER 20 MEQ TBCR Take 1 tablet by mouth BID 180 tablet 1  . amLODipine (NORVASC) 2.5 MG tablet Take 1 tablet (2.5 mg total) by mouth daily. 90 tablet 1  . Azilsartan-Chlorthalidone (EDARBYCLOR) 40-12.5 MG TABS Take 1 tablet by mouth daily. 90 tablet 1  . irbesartan-hydrochlorothiazide (AVALIDE) 300-12.5 MG tablet Take 1 tablet by mouth daily. 90 tablet 1   No facility-administered medications prior to visit.     ROS Review of Systems  Constitutional: Negative.  Negative for chills, diaphoresis, fatigue and fever.  HENT: Negative.  Negative for nosebleeds, sore throat and trouble swallowing.   Respiratory: Positive for cough. Negative for chest tightness, shortness of breath and wheezing.   Cardiovascular: Negative for chest pain, palpitations and leg swelling.  Gastrointestinal: Negative for abdominal pain, blood in stool, constipation, diarrhea, nausea and vomiting.  Genitourinary: Negative.  Negative for difficulty urinating and  hematuria.  Musculoskeletal: Negative.  Negative for arthralgias, myalgias and neck pain.  Skin: Negative.  Negative for color change and pallor.  Neurological: Negative.  Negative for weakness and light-headedness.  Hematological: Negative for adenopathy. Does not bruise/bleed easily.  Psychiatric/Behavioral: Negative.     Objective:  BP (!) 162/102 (BP Location: Left Arm, Patient Position: Sitting, Cuff Size: Large)   Pulse 75   Temp 98 F (36.7 C) (Oral)   Resp 16   Ht 5\' 10"  (1.778 m)   Wt 256 lb (116.1 kg)   SpO2 96%   BMI 36.73 kg/m   BP Readings from Last 3 Encounters:  08/19/18 (!) 162/102  11/27/17 (!) 162/96  10/31/17 (!) 144/85    Wt Readings from Last 3 Encounters:  08/19/18 256 lb (116.1 kg)  11/27/17 259 lb 8 oz (117.7 kg)  10/31/17 261 lb (118.4 kg)    Physical Exam Constitutional:      General: He is not in acute distress.    Appearance: He is obese. He is not ill-appearing, toxic-appearing or diaphoretic.  HENT:     Nose: Nose normal. No congestion.     Mouth/Throat:     Mouth: Mucous membranes are moist.     Pharynx: No oropharyngeal exudate.  Eyes:     General: No scleral icterus.    Conjunctiva/sclera: Conjunctivae normal.  Neck:     Musculoskeletal: Normal range of motion. No muscular tenderness.  Cardiovascular:     Rate and Rhythm: Normal rate and regular rhythm.  Heart sounds: No murmur. No friction rub. No gallop.   Pulmonary:     Effort: Pulmonary effort is normal. No respiratory distress.     Breath sounds: No stridor. No wheezing, rhonchi or rales.  Abdominal:     General: Abdomen is flat.     Palpations: There is no hepatomegaly, splenomegaly or mass.     Tenderness: There is no abdominal tenderness. There is no guarding.  Musculoskeletal: Normal range of motion.        General: No swelling.     Right lower leg: No edema.     Left lower leg: No edema.  Lymphadenopathy:     Cervical: No cervical adenopathy.  Skin:     General: Skin is warm and dry.  Neurological:     General: No focal deficit present.  Psychiatric:        Mood and Affect: Mood normal.        Behavior: Behavior normal.     Lab Results  Component Value Date   WBC 5.9 08/19/2018   HGB 14.7 08/19/2018   HCT 43.6 08/19/2018   PLT 433.0 (H) 08/19/2018   GLUCOSE 109 (H) 08/19/2018   CHOL 138 08/19/2018   TRIG 91.0 08/19/2018   HDL 46.90 08/19/2018   LDLDIRECT 145.0 08/26/2014   LDLCALC 73 08/19/2018   ALT 27 08/19/2018   AST 19 08/19/2018   NA 145 08/19/2018   K 3.6 08/19/2018   CL 106 08/19/2018   CREATININE 1.32 08/19/2018   BUN 12 08/19/2018   CO2 31 08/19/2018   TSH 3.08 08/19/2018   PSA 0.52 11/27/2017   INR 1.0 08/19/2018   HGBA1C 6.3 08/19/2018    No results found.  Assessment & Plan:   Cy was seen today for hypertension and hyperlipidemia.  Diagnoses and all orders for this visit:  Essential hypertension- His blood pressure is not adequately well controlled due to noncompliance.  I have asked him to restart the combination of an ARB and thiazide diuretic. -     Comprehensive metabolic panel; Future -     VITAMIN D 25 Hydroxy (Vit-D Deficiency, Fractures); Future -     Azilsartan-Chlorthalidone (EDARBYCLOR) 40-25 MG TABS; Take 1 tablet by mouth daily.  Hyperlipidemia with target LDL less than 100- He has achieved his LDL goal and is doing well on the statin. -     Lipid panel; Future -     TSH; Future   Prediabetes- His A1c is up to 6.3%.  Medical therapy is not indicated.  He was encouraged to improve his lifestyle modifications. -     Comprehensive metabolic panel; Future -     Hemoglobin A1c; Future  Thrombocytosis ([place])- His platelet count remains mildly elevated but is unchanged over the last year.  He has a slightly elevated APTT but a normal PT.  He is now had an episode of what sounds like hemoptysis so I have asked him to see hematology to see if he needs to be evaluated for coagulopathy or  lymphoproliferative disease. -     CBC with Differential/Platelet; Future -     Vitamin B12; Future -     Folate; Future -     Ferritin; Future -     IBC panel; Future  Cough with hemoptysis- His chest x-ray is negative for mass or infiltrate.  His labs are remarkable for an elevated platelet count and slightly elevated APTT.  For now, no additional follow-up or treatment is needed.  He will let me  know if the hemoptysis returns and if it does not I will order a CT of his chest. -     Protime-INR; Future -     APTT; Future -     DG Chest 2 View; Future -     Sedimentation rate; Future  Coronary artery disease involving native coronary artery of native heart without angina pectoris- He has had no recent episodes of angina.  Will continue working on risk factor modifications.  Hypertensive left ventricular hypertrophy, without heart failure -     Azilsartan-Chlorthalidone (EDARBYCLOR) 40-25 MG TABS; Take 1 tablet by mouth daily.   I have discontinued Shelden J. Blasco's amLODipine, Azilsartan-Chlorthalidone, and irbesartan-hydrochlorothiazide. I am also having him start on Azilsartan-Chlorthalidone. Additionally, I am having him maintain his Potassium Chloride ER, aspirin, atorvastatin, and Optimal-D.  Meds ordered this encounter  Medications  . Azilsartan-Chlorthalidone (EDARBYCLOR) 40-25 MG TABS    Sig: Take 1 tablet by mouth daily.    Dispense:  90 tablet    Refill:  0     Follow-up: Return in about 4 weeks (around 09/16/2018).  Scarlette Calico, MD

## 2018-08-25 ENCOUNTER — Telehealth: Payer: Self-pay | Admitting: Hematology

## 2018-08-25 NOTE — Telephone Encounter (Signed)
A new hem appt has been scheduled for the pt to see Dr. Burr Medico on 5/26 at 915am. Appt date and time has been given to the pt's wife. Aware James Sawyer should arrive 15 minutes early.
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
## 2018-09-02 ENCOUNTER — Encounter: Payer: Self-pay | Admitting: Hematology

## 2018-09-02 ENCOUNTER — Inpatient Hospital Stay: Payer: Medicare HMO

## 2018-09-02 ENCOUNTER — Other Ambulatory Visit: Payer: Self-pay

## 2018-09-02 ENCOUNTER — Inpatient Hospital Stay: Payer: Medicare HMO | Attending: Hematology | Admitting: Hematology

## 2018-09-02 VITALS — BP 147/87 | HR 73 | Temp 98.9°F | Resp 18 | Ht 70.0 in | Wt 268.1 lb

## 2018-09-02 DIAGNOSIS — R69 Illness, unspecified: Secondary | ICD-10-CM | POA: Diagnosis not present

## 2018-09-02 DIAGNOSIS — G4733 Obstructive sleep apnea (adult) (pediatric): Secondary | ICD-10-CM

## 2018-09-02 DIAGNOSIS — F17210 Nicotine dependence, cigarettes, uncomplicated: Secondary | ICD-10-CM | POA: Diagnosis not present

## 2018-09-02 DIAGNOSIS — D473 Essential (hemorrhagic) thrombocythemia: Secondary | ICD-10-CM

## 2018-09-02 DIAGNOSIS — D75839 Thrombocytosis, unspecified: Secondary | ICD-10-CM

## 2018-09-02 DIAGNOSIS — F1290 Cannabis use, unspecified, uncomplicated: Secondary | ICD-10-CM | POA: Insufficient documentation

## 2018-09-02 DIAGNOSIS — F329 Major depressive disorder, single episode, unspecified: Secondary | ICD-10-CM | POA: Diagnosis not present

## 2018-09-02 DIAGNOSIS — I10 Essential (primary) hypertension: Secondary | ICD-10-CM | POA: Diagnosis not present

## 2018-09-02 DIAGNOSIS — Z79899 Other long term (current) drug therapy: Secondary | ICD-10-CM | POA: Diagnosis not present

## 2018-09-02 DIAGNOSIS — Z7982 Long term (current) use of aspirin: Secondary | ICD-10-CM | POA: Insufficient documentation

## 2018-09-03 ENCOUNTER — Telehealth: Payer: Self-pay | Admitting: Hematology

## 2018-09-03 NOTE — Telephone Encounter (Signed)
Scheduled appt per 5/26 los. ° °A calendar will be mailed out. °

## 2018-09-10 ENCOUNTER — Telehealth: Payer: Self-pay | Admitting: *Deleted

## 2018-09-10 NOTE — Telephone Encounter (Signed)
Called pt & informed of negative genetic testing results & per Dr Burr Medico thrombocytosis probably related to smoking.  Encouraged pt to stop smoking & directed to PCP for chantix help & ACS & [place]. Pt states he is trying.

## 2018-09-15 LAB — JAK2 (INCLUDING V617F AND EXON 12), MPL,& CALR-NEXT GEN SEQ

## 2018-09-23 ENCOUNTER — Ambulatory Visit: Payer: Medicare HMO | Admitting: Internal Medicine

## 2018-11-07 ENCOUNTER — Other Ambulatory Visit: Payer: Self-pay | Admitting: Internal Medicine

## 2018-11-07 DIAGNOSIS — I2510 Atherosclerotic heart disease of native coronary artery without angina pectoris: Secondary | ICD-10-CM

## 2018-11-07 DIAGNOSIS — E559 Vitamin D deficiency, unspecified: Secondary | ICD-10-CM

## 2018-11-07 DIAGNOSIS — E785 Hyperlipidemia, unspecified: Secondary | ICD-10-CM

## 2018-11-17 ENCOUNTER — Other Ambulatory Visit: Payer: Self-pay | Admitting: Internal Medicine

## 2018-11-17 DIAGNOSIS — I119 Hypertensive heart disease without heart failure: Secondary | ICD-10-CM

## 2018-11-17 DIAGNOSIS — I10 Essential (primary) hypertension: Secondary | ICD-10-CM

## 2018-11-17 MED ORDER — EDARBYCLOR 40-25 MG PO TABS: 1.0000 | ORAL_TABLET | Freq: Every day | ORAL | 0 refills | Status: DC

## 2019-01-02 ENCOUNTER — Other Ambulatory Visit: Payer: Self-pay

## 2019-01-02 DIAGNOSIS — D75839 Thrombocytosis, unspecified: Secondary | ICD-10-CM

## 2019-01-02 DIAGNOSIS — D473 Essential (hemorrhagic) thrombocythemia: Secondary | ICD-10-CM

## 2019-01-05 ENCOUNTER — Inpatient Hospital Stay: Payer: Medicare HMO | Attending: Internal Medicine

## 2019-01-05 ENCOUNTER — Inpatient Hospital Stay: Payer: Medicare HMO | Admitting: Hematology

## 2019-02-23 ENCOUNTER — Other Ambulatory Visit: Payer: Self-pay | Admitting: Internal Medicine

## 2019-02-23 DIAGNOSIS — F329 Major depressive disorder, single episode, unspecified: Secondary | ICD-10-CM

## 2019-02-23 DIAGNOSIS — F32A Depression, unspecified: Secondary | ICD-10-CM

## 2019-02-23 DIAGNOSIS — E559 Vitamin D deficiency, unspecified: Secondary | ICD-10-CM

## 2019-02-24 ENCOUNTER — Ambulatory Visit: Payer: Medicare HMO | Admitting: Internal Medicine

## 2019-02-25 ENCOUNTER — Other Ambulatory Visit: Payer: Self-pay | Admitting: Internal Medicine

## 2019-02-25 ENCOUNTER — Telehealth: Payer: Self-pay | Admitting: Internal Medicine

## 2019-02-25 DIAGNOSIS — E785 Hyperlipidemia, unspecified: Secondary | ICD-10-CM

## 2019-02-25 DIAGNOSIS — I10 Essential (primary) hypertension: Secondary | ICD-10-CM

## 2019-02-25 DIAGNOSIS — I2510 Atherosclerotic heart disease of native coronary artery without angina pectoris: Secondary | ICD-10-CM

## 2019-02-25 DIAGNOSIS — I119 Hypertensive heart disease without heart failure: Secondary | ICD-10-CM

## 2019-02-25 MED ORDER — EDARBYCLOR 40-25 MG PO TABS: 1.0000 | ORAL_TABLET | Freq: Every day | ORAL | 0 refills | Status: DC

## 2019-02-25 MED ORDER — ATORVASTATIN CALCIUM 40 MG PO TABS: 40.0000 mg | ORAL_TABLET | Freq: Every day | ORAL | 0 refills | Status: DC

## 2019-02-25 NOTE — Telephone Encounter (Signed)
Pt has scheduled his appt for 11/24 at 11am.   Requesting refills to get him to the appt. Please advise.

## 2019-02-25 NOTE — Telephone Encounter (Signed)
Pt wants a return call from Dr Ronnald Ramp nurse. He is out of all his medications and has thrown all the bottles away and does not know what he takes and needs refills. FU at [phone]

## 2019-02-25 NOTE — Telephone Encounter (Signed)
Patient calling to check status of getting medications. Advised that medications have not been sent yet. Also advised that the medication refill that was requested on 02/23/2019 was denied due to needing an appointment. Made appointment for 03/03/2019 at 11:00am. Patient would like to know if his refills could be sent in before appointment since he has been out of mall his medications, except 1, since Monday. Please advise.

## 2019-02-25 NOTE — Telephone Encounter (Signed)
RXs sent.

## 2019-02-26 ENCOUNTER — Other Ambulatory Visit: Payer: Self-pay | Admitting: Internal Medicine

## 2019-02-26 DIAGNOSIS — I119 Hypertensive heart disease without heart failure: Secondary | ICD-10-CM

## 2019-02-26 DIAGNOSIS — I10 Essential (primary) hypertension: Secondary | ICD-10-CM

## 2019-02-26 MED ORDER — EDARBYCLOR 40-25 MG PO TABS: 1.0000 | ORAL_TABLET | Freq: Every day | ORAL | 0 refills | Status: DC

## 2019-02-26 NOTE — Telephone Encounter (Signed)
Pt calling - states that the pharmacy is telling him they still don't have prescriptions for medications.

## 2019-02-26 NOTE — Telephone Encounter (Signed)
PA has been started

## 2019-02-26 NOTE — Telephone Encounter (Signed)
Pt has been informed of same.

## 2019-02-27 ENCOUNTER — Other Ambulatory Visit: Payer: Self-pay | Admitting: Internal Medicine

## 2019-02-27 ENCOUNTER — Telehealth: Payer: Self-pay | Admitting: Internal Medicine

## 2019-02-27 DIAGNOSIS — I10 Essential (primary) hypertension: Secondary | ICD-10-CM

## 2019-02-27 DIAGNOSIS — I2510 Atherosclerotic heart disease of native coronary artery without angina pectoris: Secondary | ICD-10-CM

## 2019-02-27 MED ORDER — IRBESARTAN 150 MG PO TABS: 150.0000 mg | ORAL_TABLET | Freq: Every day | ORAL | 0 refills | Status: DC

## 2019-02-27 MED ORDER — INDAPAMIDE 1.25 MG PO TABS: 1.2500 mg | ORAL_TABLET | Freq: Every day | ORAL | 0 refills | Status: DC

## 2019-02-27 NOTE — Telephone Encounter (Signed)
New RX's sent to [facility]

## 2019-02-27 NOTE — Telephone Encounter (Signed)
Called insurance and started PA via phone. The PA has been approved.  Pt contacted and informed of same. Pt to call pharmacy for pick up.

## 2019-02-27 NOTE — Telephone Encounter (Signed)
Patient calling and states that the Azilsartan-Chlorthalidone (EDARBYCLOR) 40-25 MG TABS Is going to be $2xx for a 30 day supply and he cannot afford that each month. States that he was told there was not a generic form of this medication and would like to know if there is an alternate that may be cheaper? Please advise.

## 2019-03-03 ENCOUNTER — Other Ambulatory Visit: Payer: Self-pay

## 2019-03-03 ENCOUNTER — Ambulatory Visit (INDEPENDENT_AMBULATORY_CARE_PROVIDER_SITE_OTHER): Payer: Medicare HMO | Admitting: Internal Medicine

## 2019-03-03 ENCOUNTER — Other Ambulatory Visit (INDEPENDENT_AMBULATORY_CARE_PROVIDER_SITE_OTHER): Payer: Medicare HMO

## 2019-03-03 ENCOUNTER — Encounter: Payer: Self-pay | Admitting: Internal Medicine

## 2019-03-03 VITALS — BP 152/90 | HR 80 | Temp 98.8°F | Resp 16 | Ht 70.0 in | Wt 264.0 lb

## 2019-03-03 DIAGNOSIS — I10 Essential (primary) hypertension: Secondary | ICD-10-CM | POA: Diagnosis not present

## 2019-03-03 DIAGNOSIS — N401 Enlarged prostate with lower urinary tract symptoms: Secondary | ICD-10-CM

## 2019-03-03 DIAGNOSIS — D75839 Thrombocytosis, unspecified: Secondary | ICD-10-CM

## 2019-03-03 DIAGNOSIS — I119 Hypertensive heart disease without heart failure: Secondary | ICD-10-CM | POA: Diagnosis not present

## 2019-03-03 DIAGNOSIS — R7303 Prediabetes: Secondary | ICD-10-CM | POA: Diagnosis not present

## 2019-03-03 DIAGNOSIS — Z0000 Encounter for general adult medical examination without abnormal findings: Secondary | ICD-10-CM | POA: Diagnosis not present

## 2019-03-03 DIAGNOSIS — D473 Essential (hemorrhagic) thrombocythemia: Secondary | ICD-10-CM | POA: Diagnosis not present

## 2019-03-03 DIAGNOSIS — R351 Nocturia: Secondary | ICD-10-CM

## 2019-03-03 DIAGNOSIS — E559 Vitamin D deficiency, unspecified: Secondary | ICD-10-CM

## 2019-03-03 DIAGNOSIS — E876 Hypokalemia: Secondary | ICD-10-CM

## 2019-03-03 DIAGNOSIS — I2510 Atherosclerotic heart disease of native coronary artery without angina pectoris: Secondary | ICD-10-CM

## 2019-03-03 DIAGNOSIS — E785 Hyperlipidemia, unspecified: Secondary | ICD-10-CM | POA: Diagnosis not present

## 2019-03-03 DIAGNOSIS — F331 Major depressive disorder, recurrent, moderate: Secondary | ICD-10-CM

## 2019-03-03 DIAGNOSIS — R69 Illness, unspecified: Secondary | ICD-10-CM | POA: Diagnosis not present

## 2019-03-03 DIAGNOSIS — Z23 Encounter for immunization: Secondary | ICD-10-CM

## 2019-03-03 DIAGNOSIS — E538 Deficiency of other specified B group vitamins: Secondary | ICD-10-CM | POA: Diagnosis not present

## 2019-03-03 DIAGNOSIS — Z720 Tobacco use: Secondary | ICD-10-CM

## 2019-03-03 LAB — CBC WITH DIFFERENTIAL/PLATELET
Basophils Absolute: 0.1 10*3/uL (ref 0.0–0.1)
Basophils Relative: 1 % (ref 0.0–3.0)
Eosinophils Absolute: 0.1 10*3/uL (ref 0.0–0.7)
Eosinophils Relative: 2.1 % (ref 0.0–5.0)
HCT: 43.6 % (ref 39.0–52.0)
Hemoglobin: 14.4 g/dL (ref 13.0–17.0)
Lymphocytes Relative: 40.1 % (ref 12.0–46.0)
Lymphs Abs: 2.1 10*3/uL (ref 0.7–4.0)
MCHC: 33 g/dL (ref 30.0–36.0)
MCV: 96.1 fl (ref 78.0–100.0)
Monocytes Absolute: 0.3 10*3/uL (ref 0.1–1.0)
Monocytes Relative: 5.2 % (ref 3.0–12.0)
Neutro Abs: 2.7 10*3/uL (ref 1.4–7.7)
Neutrophils Relative %: 51.6 % (ref 43.0–77.0)
Platelets: 433 10*3/uL — ABNORMAL HIGH (ref 150.0–400.0)
RBC: 4.54 Mil/uL (ref 4.22–5.81)
RDW: 15 % (ref 11.5–15.5)
WBC: 5.2 10*3/uL (ref 4.0–10.5)

## 2019-03-03 LAB — FERRITIN: Ferritin: 20.6 ng/mL — ABNORMAL LOW (ref 22.0–322.0)

## 2019-03-03 LAB — VITAMIN D 25 HYDROXY (VIT D DEFICIENCY, FRACTURES): VITD: 44.29 ng/mL (ref 30.00–100.00)

## 2019-03-03 LAB — HEPATIC FUNCTION PANEL
ALT: 30 U/L (ref 0–53)
AST: 19 U/L (ref 0–37)
Albumin: 3.9 g/dL (ref 3.5–5.2)
Alkaline Phosphatase: 69 U/L (ref 39–117)
Bilirubin, Direct: 0.1 mg/dL (ref 0.0–0.3)
Total Bilirubin: 0.6 mg/dL (ref 0.2–1.2)
Total Protein: 6.8 g/dL (ref 6.0–8.3)

## 2019-03-03 LAB — LIPID PANEL
Cholesterol: 178 mg/dL (ref 0–200)
HDL: 42.2 mg/dL (ref >39.00)
LDL Cholesterol: 105 mg/dL — ABNORMAL HIGH (ref 0–99)
NonHDL: 135.38
Total CHOL/HDL Ratio: 4
Triglycerides: 154 mg/dL — ABNORMAL HIGH (ref 0.0–149.0)
VLDL: 30.8 mg/dL (ref 0.0–40.0)

## 2019-03-03 LAB — BASIC METABOLIC PANEL
BUN: 16 mg/dL (ref 6–23)
CO2: 30 mEq/L (ref 19–32)
Calcium: 9.3 mg/dL (ref 8.4–10.5)
Chloride: 105 mEq/L (ref 96–112)
Creatinine, Ser: 1.4 mg/dL (ref 0.40–1.50)
GFR: 60.93 mL/min (ref >60.00)
Glucose, Bld: 180 mg/dL — ABNORMAL HIGH (ref 70–99)
Potassium: 3.5 mEq/L (ref 3.5–5.1)
Sodium: 141 mEq/L (ref 135–145)

## 2019-03-03 LAB — IBC PANEL
Iron: 94 ug/dL (ref 42–165)
Saturation Ratios: 22.9 % (ref 20.0–50.0)
Transferrin: 293 mg/dL (ref 212.0–360.0)

## 2019-03-03 LAB — HEMOGLOBIN A1C: Hgb A1c MFr Bld: 6.2 % (ref 4.6–6.5)

## 2019-03-03 LAB — VITAMIN B12: Vitamin B-12: 201 pg/mL — ABNORMAL LOW (ref 211–911)

## 2019-03-03 LAB — PSA: PSA: 0.59 ng/mL (ref 0.10–4.00)

## 2019-03-03 LAB — FOLATE: Folate: 6.8 ng/mL (ref >5.9)

## 2019-03-03 LAB — TSH: TSH: 2.44 u[IU]/mL (ref 0.35–4.50)

## 2019-03-03 MED ORDER — OPTIMAL-D 1.25 MG (50000 UT) PO CAPS: 50000.0000 [IU] | ORAL_CAPSULE | ORAL | 0 refills | Status: DC

## 2019-03-03 MED ORDER — POTASSIUM CHLORIDE ER 20 MEQ PO TBCR: EXTENDED_RELEASE_TABLET | ORAL | 1 refills | Status: DC

## 2019-03-03 MED ORDER — DULOXETINE HCL 30 MG PO CPEP: 30.0000 mg | ORAL_CAPSULE | Freq: Every day | ORAL | 0 refills | Status: DC

## 2019-03-03 MED ORDER — BUPROPION HCL ER (XL) 150 MG PO TB24: 150.0000 mg | ORAL_TABLET | Freq: Every day | ORAL | 0 refills | Status: DC

## 2019-03-03 NOTE — Patient Instructions (Signed)

## 2019-03-03 NOTE — Progress Notes (Signed)
Subjective:  Patient ID: James Sawyer, male    DOB: 1950-06-03  Age: 68 y.o. MRN: [id number]  CC: Annual Exam, Hypertension, and Hyperlipidemia  This visit occurred during the SARS-CoV-2 public health emergency.  Safety protocols were in place, including screening questions prior to the visit, additional usage of staff PPE, and extensive cleaning of exam room while observing appropriate contact time as indicated for disinfecting solutions.    HPI MARISOL LINKENHOKER presents for a CPX.  His wife is with him today and she complains that she thinks he is depressed.  She says that he is up at night and sleeps during the day and that he is very irritable.  He was treated for depression years ago but did not stay on the antidepressant.  He does not know if the antidepressants helped or not.  He admits to anhedonia and irritability.  He denies apathy or feeling hopeless or helpless.  He denies SI or HI.  Outpatient Medications Prior to Visit  Medication Sig Dispense Refill   aspirin 81 MG tablet Take 1 tablet (81 mg total) by mouth daily. 90 tablet 1   atorvastatin (LIPITOR) 40 MG tablet Take 1 tablet (40 mg total) by mouth daily. 90 tablet 0   indapamide (LOZOL) 1.25 MG tablet Take 1 tablet (1.25 mg total) by mouth daily. 90 tablet 0   irbesartan (AVAPRO) 150 MG tablet Take 1 tablet (150 mg total) by mouth daily. 90 tablet 0   Omega-3 Fatty Acids (FISH OIL) 1000 MG CAPS Take by mouth.     OPTIMAL-D 1.25 MG (50000 UT) capsule Take 1 capsule by mouth once a week (Patient not taking: Reported on 03/03/2019) 12 capsule 0   Potassium Chloride ER 20 MEQ TBCR Take 1 tablet by mouth BID (Patient not taking: Reported on 03/03/2019) 180 tablet 1   No facility-administered medications prior to visit.     ROS Review of Systems  Constitutional: Negative.  Negative for diaphoresis, fatigue and unexpected weight change.  HENT: Negative.   Eyes: Negative for visual disturbance.  Respiratory: Negative  for cough, chest tightness, shortness of breath and wheezing.   Cardiovascular: Negative for chest pain, palpitations and leg swelling.  Gastrointestinal: Negative for abdominal pain, constipation, diarrhea, nausea and vomiting.  Endocrine: Negative.   Genitourinary: Negative.  Negative for difficulty urinating.  Musculoskeletal: Negative.  Negative for arthralgias and myalgias.  Skin: Negative.  Negative for color change and pallor.  Neurological: Negative.  Negative for dizziness, weakness and light-headedness.  Hematological: Negative.  Negative for adenopathy. Does not bruise/bleed easily.  Psychiatric/Behavioral: Positive for dysphoric mood and sleep disturbance. Negative for confusion, decreased concentration and suicidal ideas. The patient is not nervous/anxious.     Objective:  BP (!) 152/90 (BP Location: Left Arm, Patient Position: Sitting, Cuff Size: Large)    Pulse 80    Temp 98.8 F (37.1 C) (Oral)    Resp 16    Ht 5\' 10"  (1.778 m)    Wt 264 lb (119.7 kg)    SpO2 94%    BMI 37.88 kg/m   BP Readings from Last 3 Encounters:  03/03/19 (!) 152/90  09/02/18 (!) 147/87  08/19/18 (!) 162/102    Wt Readings from Last 3 Encounters:  03/03/19 264 lb (119.7 kg)  09/02/18 268 lb 1.6 oz (121.6 kg)  08/19/18 256 lb (116.1 kg)    Physical Exam Vitals signs reviewed.  Constitutional:      Appearance: He is obese. He is not ill-appearing.  HENT:     Nose: Nose normal.     Mouth/Throat:     Mouth: Mucous membranes are moist.  Eyes:     General: No scleral icterus.    Conjunctiva/sclera: Conjunctivae normal.  Neck:     Musculoskeletal: Neck supple.  Cardiovascular:     Rate and Rhythm: Normal rate and regular rhythm.     Heart sounds: Murmur present. Systolic murmur present with a grade of 1/6. Gallop present. S3 sounds present.      Comments: EKG ---  Sinus  Rhythm  -First degree A-V block  PRi = 230 Voltage criteria for LVH  (R(aVL) exceeds 1.26 mV).   -Nonspecific ST  depression   +   Nonspecific T-abnormality  -Seen with left ventricular hypertrophy (strain).   ABNORMAL   Pulmonary:     Effort: Pulmonary effort is normal.     Breath sounds: No stridor. No wheezing, rhonchi or rales.  Abdominal:     General: Abdomen is protuberant. Bowel sounds are normal. There is no distension.     Palpations: There is no hepatomegaly, splenomegaly or mass.     Tenderness: There is no abdominal tenderness. There is no guarding.  Genitourinary:    Comments: He was not willing to undress for a GU or rectal exam. Musculoskeletal: Normal range of motion.     Right lower leg: No edema.     Left lower leg: No edema.  Lymphadenopathy:     Cervical: No cervical adenopathy.  Neurological:     General: No focal deficit present.     Mental Status: He is alert.  Psychiatric:        Mood and Affect: Mood normal.        Behavior: Behavior normal.     Lab Results  Component Value Date   WBC 5.2 03/03/2019   HGB 14.4 03/03/2019   HCT 43.6 03/03/2019   PLT 433.0 (H) 03/03/2019   GLUCOSE 180 (H) 03/03/2019   CHOL 178 03/03/2019   TRIG 154.0 (H) 03/03/2019   HDL 42.20 03/03/2019   LDLDIRECT 145.0 08/26/2014   LDLCALC 105 (H) 03/03/2019   ALT 30 03/03/2019   AST 19 03/03/2019   NA 141 03/03/2019   K 3.5 03/03/2019   CL 105 03/03/2019   CREATININE 1.40 03/03/2019   BUN 16 03/03/2019   CO2 30 03/03/2019   TSH 2.44 03/03/2019   PSA 0.59 03/03/2019   INR 1.0 08/19/2018   HGBA1C 6.2 03/03/2019    Dg Chest 2 View  Result Date: 08/19/2018 CLINICAL DATA:  Hemoptysis beginning yesterday. EXAM: CHEST - 2 VIEW COMPARISON:  08/26/2014 FINDINGS: Heart size is normal. There is tortuosity of the aorta. The pulmonary vascularity is normal. The lungs are clear. Infiltrate, mass, effusion or collapse. IMPRESSION: No active disease.  No abnormality seen to explain hemoptysis. Electronically Signed   By: Nelson Chimes M.D.   On: 08/19/2018 15:24    Assessment & Plan:   Lamier  was seen today for annual exam, hypertension and hyperlipidemia.  Diagnoses and all orders for this visit:  Thrombocytosis ([place])- His platelet count remains mildly elevated but stable.  His other cell lines are normal so I think this is a benign condition.  I will treat the B12 deficiency. -     CBC with Differential; Future -     IBC panel; Future -     B12; Future -     Folate; Future -     Ferritin; Future -  Vitamin B1; Future  Prediabetes- His A1c is at 6.2%.  Medical therapy is not indicated. -     Basic metabolic panel; Future -     Hemoglobin A1c; Future  Need for influenza vaccination -     Flu Vaccine QUAD High Dose(Fluad)  Coronary artery disease involving native coronary artery of native heart without angina pectoris -     Lipid panel; Future -     EKG 12-Lead -     Ambulatory referral to Cardiology  Essential hypertension- His blood pressure is not adequately well controlled due to some recent noncompliance as a result of Edarbyclor being too expensive.  I have asked him to be compliant with the ARB and thiazide diuretic and to improve his lifestyle modifications. -     TSH; Future -     EKG 12-Lead -     Potassium Chloride ER 20 MEQ TBCR; Take 1 tablet by mouth BID  Hypertensive left ventricular hypertrophy, without heart failure -     TSH; Future -     Ambulatory referral to Cardiology  BPH associated with nocturia- His PSA is low which is reassuring that he does not have prostate cancer.  He has no symptoms that need to be treated. -     PSA; Future  Vitamin D deficiency disease -     Vitamin D 25 hydroxy; Future -     Cholecalciferol (OPTIMAL-D) 1.25 MG (50000 UT) capsule; Take 1 capsule (50,000 Units total) by mouth once a week.  Hyperlipidemia with target LDL less than 100- He has achieved his LDL goal and is doing well on the statin. -     TSH; Future -     Lipid panel; Future -     Hepatic function panel; Future  Tobacco user -     Ambulatory  Referral for Lung Cancer Scre  Moderate episode of recurrent major depressive disorder (HCC) -     DULoxetine (CYMBALTA) 30 MG capsule; Take 1 capsule (30 mg total) by mouth daily. -     buPROPion (WELLBUTRIN XL) 150 MG 24 hr tablet; Take 1 tablet (150 mg total) by mouth daily.  B12 deficiency  Hypokalemia -     Potassium Chloride ER 20 MEQ TBCR; Take 1 tablet by mouth BID  Routine general medical examination at a health care facility- Limited exam completed at his request, labs reviewed, vaccines reviewed and updated, colon cancer screening is up-to-date, patient education was given.   I have changed Zakar J. Bainter's Optimal-D. I am also having him start on DULoxetine and buPROPion. Additionally, I am having him maintain his aspirin, atorvastatin, irbesartan, indapamide, Fish Oil, and Potassium Chloride ER.  Meds ordered this encounter  Medications   DULoxetine (CYMBALTA) 30 MG capsule    Sig: Take 1 capsule (30 mg total) by mouth daily.    Dispense:  30 capsule    Refill:  0   buPROPion (WELLBUTRIN XL) 150 MG 24 hr tablet    Sig: Take 1 tablet (150 mg total) by mouth daily.    Dispense:  30 tablet    Refill:  0   Potassium Chloride ER 20 MEQ TBCR    Sig: Take 1 tablet by mouth BID    Dispense:  180 tablet    Refill:  1   Cholecalciferol (OPTIMAL-D) 1.25 MG (50000 UT) capsule    Sig: Take 1 capsule (50,000 Units total) by mouth once a week.    Dispense:  12 capsule    Refill:  0     Follow-up: Return in about 4 weeks (around 03/31/2019).  Scarlette Calico, MD

## 2019-03-04 ENCOUNTER — Ambulatory Visit (INDEPENDENT_AMBULATORY_CARE_PROVIDER_SITE_OTHER): Payer: Medicare HMO

## 2019-03-04 DIAGNOSIS — E538 Deficiency of other specified B group vitamins: Secondary | ICD-10-CM | POA: Diagnosis not present

## 2019-03-04 MED ORDER — CYANOCOBALAMIN 1000 MCG/ML IJ SOLN
1000.0000 ug | Freq: Once | INTRAMUSCULAR | Status: AC
  Administered 2019-03-04: 1000 ug via INTRAMUSCULAR

## 2019-03-04 NOTE — Progress Notes (Signed)
I have reviewed and agree.

## 2019-03-10 ENCOUNTER — Encounter: Payer: Self-pay | Admitting: Internal Medicine

## 2019-03-10 ENCOUNTER — Other Ambulatory Visit: Payer: Self-pay | Admitting: Internal Medicine

## 2019-03-10 DIAGNOSIS — E519 Thiamine deficiency, unspecified: Secondary | ICD-10-CM

## 2019-03-10 LAB — VITAMIN B1: Vitamin B1 (Thiamine): <6 nmol/L — ABNORMAL LOW (ref 8–30)

## 2019-03-10 MED ORDER — VITAMIN B-1 100 MG PO TABS: 100.0000 mg | ORAL_TABLET | Freq: Every day | ORAL | 1 refills | Status: DC

## 2019-03-11 ENCOUNTER — Ambulatory Visit (INDEPENDENT_AMBULATORY_CARE_PROVIDER_SITE_OTHER): Payer: Medicare HMO

## 2019-03-11 ENCOUNTER — Other Ambulatory Visit: Payer: Self-pay

## 2019-03-11 DIAGNOSIS — E538 Deficiency of other specified B group vitamins: Secondary | ICD-10-CM | POA: Diagnosis not present

## 2019-03-11 MED ORDER — CYANOCOBALAMIN 1000 MCG/ML IJ SOLN
1000.0000 ug | Freq: Once | INTRAMUSCULAR | Status: AC
  Administered 2019-03-11: 1000 ug via INTRAMUSCULAR

## 2019-03-11 NOTE — Progress Notes (Signed)
I have reviewed and agree.

## 2019-03-17 ENCOUNTER — Encounter: Payer: Self-pay | Admitting: Internal Medicine

## 2019-03-18 ENCOUNTER — Other Ambulatory Visit: Payer: Self-pay

## 2019-03-18 ENCOUNTER — Ambulatory Visit (INDEPENDENT_AMBULATORY_CARE_PROVIDER_SITE_OTHER): Payer: Medicare HMO | Admitting: *Deleted

## 2019-03-18 DIAGNOSIS — E538 Deficiency of other specified B group vitamins: Secondary | ICD-10-CM | POA: Diagnosis not present

## 2019-03-18 MED ORDER — CYANOCOBALAMIN 1000 MCG/ML IJ SOLN
1000.0000 ug | Freq: Once | INTRAMUSCULAR | Status: AC
  Administered 2019-03-18: 1000 ug via INTRAMUSCULAR

## 2019-03-18 NOTE — Progress Notes (Signed)
I have reviewed and agree.

## 2019-03-23 ENCOUNTER — Other Ambulatory Visit: Payer: Self-pay | Admitting: *Deleted

## 2019-03-23 DIAGNOSIS — Z87891 Personal history of nicotine dependence: Secondary | ICD-10-CM

## 2019-03-23 DIAGNOSIS — F17210 Nicotine dependence, cigarettes, uncomplicated: Secondary | ICD-10-CM

## 2019-03-25 ENCOUNTER — Ambulatory Visit: Payer: Medicare HMO | Admitting: Cardiology

## 2019-03-25 ENCOUNTER — Encounter: Payer: Self-pay | Admitting: Cardiology

## 2019-03-25 ENCOUNTER — Other Ambulatory Visit: Payer: Self-pay

## 2019-03-25 VITALS — BP 152/90 | HR 64 | Ht 70.0 in | Wt 260.0 lb

## 2019-03-25 DIAGNOSIS — R69 Illness, unspecified: Secondary | ICD-10-CM | POA: Diagnosis not present

## 2019-03-25 DIAGNOSIS — I2510 Atherosclerotic heart disease of native coronary artery without angina pectoris: Secondary | ICD-10-CM

## 2019-03-25 DIAGNOSIS — G4733 Obstructive sleep apnea (adult) (pediatric): Secondary | ICD-10-CM

## 2019-03-25 DIAGNOSIS — N182 Chronic kidney disease, stage 2 (mild): Secondary | ICD-10-CM

## 2019-03-25 DIAGNOSIS — F17200 Nicotine dependence, unspecified, uncomplicated: Secondary | ICD-10-CM

## 2019-03-25 DIAGNOSIS — E785 Hyperlipidemia, unspecified: Secondary | ICD-10-CM | POA: Diagnosis not present

## 2019-03-25 DIAGNOSIS — E669 Obesity, unspecified: Secondary | ICD-10-CM

## 2019-03-25 DIAGNOSIS — I10 Essential (primary) hypertension: Secondary | ICD-10-CM | POA: Diagnosis not present

## 2019-03-25 MED ORDER — ATORVASTATIN CALCIUM 80 MG PO TABS: 80.0000 mg | ORAL_TABLET | Freq: Every day | ORAL | 3 refills | Status: DC

## 2019-03-25 NOTE — Patient Instructions (Addendum)
Medication Instructions:  INCREASE: Atorvastatin to 80 mg once a day,  If this medication is too expensive call our office and let us know so we can decrease it back to 40 mg daily   *If you need a refill on your cardiac medications before your next appointment, please call your pharmacy*  Lab Work: FUTURE: Your physician recommends that you return for a FASTING lipid profile and hepatic function test on 05/20/2019 Open: 7:30 AM to 4:30 PM   If you have labs (blood work) drawn today and your tests are completely normal, you will receive your results only by: Marland Kitchen MyChart Message (if you have MyChart) OR . A paper copy in the mail If you have any lab test that is abnormal or we need to change your treatment, we will call you to review the results.  Testing/Procedures: None   Follow-Up: At [facility], you and your health needs are our priority.  As part of our continuing mission to provide you with exceptional heart care, we have created designated Provider Care Teams.  These Care Teams include your primary Cardiologist (physician) and Advanced Practice Providers (APPs -  Physician Assistants and Nurse Practitioners) who all work together to provide you with the care you need, when you need it.  Your next appointment:   6 month(s)  The format for your next appointment:   In Person  Provider:   You may see Dr. Harrington Challenger or one of the following Advanced Practice Providers on your designated Care Team:    Richardson Dopp, PA-C  [place], [place]  Daune Perch, NP   Other Instructions  Lifestyle Modifications to Prevent and Treat Heart Disease -Recommend heart healthy/Mediterranean diet, with whole grains, fruits, vegetables, fish, lean meats, nuts, olive oil and avocado oil.  -Limit salt intake to less than 2000 mg per day.  -Recommend moderate walking, starting slowly with a few minutes and working up to 3-5 times/week for 30-50 minutes each session. Aim for at least 150 minutes.week.  Goal should be pace of 3 miles/hours, or walking 1.5 miles in 30 minutes -Recommend avoidance of tobacco products. Avoid excess alcohol. -Keep blood pressure well controlled, ideally less than 130/80.  ==============================================   Coping with Quitting Smoking  Quitting smoking is a physical and mental challenge. You will face cravings, withdrawal symptoms, and temptation. Before quitting, work with your health care provider to make a plan that can help you cope. Preparation can help you quit and keep you from giving in. How can I cope with cravings? Cravings usually last for 5-10 minutes. If you get through it, the craving will pass. Consider taking the following actions to help you cope with cravings:  Keep your mouth busy: ? Chew sugar-free gum. ? Suck on hard candies or a straw. ? Brush your teeth.  Keep your hands and body busy: ? Immediately change to a different activity when you feel a craving. ? Squeeze or play with a ball. ? Do an activity or a hobby, like making bead jewelry, practicing needlepoint, or working with wood. ? Mix up your normal routine. ? Take a short exercise break. Go for a quick walk or run up and down stairs. ? Spend time in public places where smoking is not allowed.  Focus on doing something kind or helpful for someone else.  Call a friend or family member to talk during a craving.  Join a support group.  Call a quit line, such as 1-800-QUIT-NOW.  Talk with your health care provider  about medicines that might help you cope with cravings and make quitting easier for you. How can I deal with withdrawal symptoms? Your body may experience negative effects as it tries to get used to not having nicotine in the system. These effects are called withdrawal symptoms. They may include:  Feeling hungrier than normal.  Trouble concentrating.  Irritability.  Trouble sleeping.  Feeling depressed.  Restlessness and agitation.  Craving  a cigarette. To manage withdrawal symptoms:  Avoid places, people, and activities that trigger your cravings.  Remember why you want to quit.  Get plenty of sleep.  Avoid coffee and other caffeinated drinks. These may worsen some of your symptoms. How can I handle social situations? Social situations can be difficult when you are quitting smoking, especially in the first few weeks. To manage this, you can:  Avoid parties, bars, and other social situations where people might be smoking.  Avoid alcohol.  Leave right away if you have the urge to smoke.  Explain to your family and friends that you are quitting smoking. Ask for understanding and support.  Plan activities with friends or family where smoking is not an option. What are some ways I can cope with stress? Wanting to smoke may cause stress, and stress can make you want to smoke. Find ways to manage your stress. Relaxation techniques can help. For example:  Breathe slowly and deeply, in through your nose and out through your mouth.  Listen to soothing, relaxing music.  Talk with a family member or friend about your stress.  Light a candle.  Soak in a bath or take a shower.  Think about a peaceful place. What are some ways I can prevent weight gain? Be aware that many people gain weight after they quit smoking. However, not everyone does. To keep from gaining weight, have a plan in place before you quit and stick to the plan after you quit. Your plan should include:  Having healthy snacks. When you have a craving, it may help to: ? Eat plain popcorn, crunchy carrots, celery, or other cut vegetables. ? Chew sugar-free gum.  Changing how you eat: ? Eat small portion sizes at meals. ? Eat 4-6 small meals throughout the day instead of 1-2 large meals a day. ? Be mindful when you eat. Do not watch television or do other things that might distract you as you eat.  Exercising regularly: ? Make time to exercise each day.  If you do not have time for a long workout, do short bouts of exercise for 5-10 minutes several times a day. ? Do some form of strengthening exercise, like weight lifting, and some form of aerobic exercise, like running or swimming.  Drinking plenty of water or other low-calorie or no-calorie drinks. Drink 6-8 glasses of water daily, or as much as instructed by your health care provider. Summary  Quitting smoking is a physical and mental challenge. You will face cravings, withdrawal symptoms, and temptation to smoke again. Preparation can help you as you go through these challenges.  You can cope with cravings by keeping your mouth busy (such as by chewing gum), keeping your body and hands busy, and making calls to family, friends, or a helpline for people who want to quit smoking.  You can cope with withdrawal symptoms by avoiding places where people smoke, avoiding drinks with caffeine, and getting plenty of rest.  Ask your health care provider about the different ways to prevent weight gain, avoid stress, and handle social situations.  This information is not intended to replace advice given to you by your health care provider. Make sure you discuss any questions you have with your health care provider. Document Released: 03/23/2016 Document Revised: 03/08/2017 Document Reviewed: 03/23/2016 Elsevier Patient Education  2020 [employer].
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
## 2019-03-31 ENCOUNTER — Other Ambulatory Visit: Payer: Self-pay

## 2019-03-31 ENCOUNTER — Ambulatory Visit (INDEPENDENT_AMBULATORY_CARE_PROVIDER_SITE_OTHER): Payer: Medicare HMO | Admitting: Internal Medicine

## 2019-03-31 ENCOUNTER — Encounter: Payer: Self-pay | Admitting: Internal Medicine

## 2019-03-31 VITALS — BP 136/84 | HR 89 | Temp 98.2°F | Resp 16 | Ht 70.0 in | Wt 259.0 lb

## 2019-03-31 DIAGNOSIS — I10 Essential (primary) hypertension: Secondary | ICD-10-CM | POA: Diagnosis not present

## 2019-03-31 DIAGNOSIS — F331 Major depressive disorder, recurrent, moderate: Secondary | ICD-10-CM

## 2019-03-31 DIAGNOSIS — R69 Illness, unspecified: Secondary | ICD-10-CM | POA: Diagnosis not present

## 2019-03-31 MED ORDER — DULOXETINE HCL 60 MG PO CPEP: 60.0000 mg | ORAL_CAPSULE | Freq: Every day | ORAL | 1 refills | Status: DC

## 2019-03-31 NOTE — Patient Instructions (Signed)

## 2019-03-31 NOTE — Progress Notes (Signed)
Subjective:  Patient ID: James Sawyer, male    DOB: January 15, 1951  Age: 68 y.o. MRN: [id number]  CC: Hypertension and Depression  This visit occurred during the SARS-CoV-2 public health emergency.  Safety protocols were in place, including screening questions prior to the visit, additional usage of staff PPE, and extensive cleaning of exam room while observing appropriate contact time as indicated for disinfecting solutions.    HPI James Sawyer presents for f/up - His wife is with him today and they both think that he is doing better on the new antidepressant regimen.  He is isolating less and has less irritability.  His only side effect is been mild dry mouth which he is treating with an over-the-counter artificial saliva spray.  Outpatient Medications Prior to Visit  Medication Sig Dispense Refill  . aspirin 81 MG tablet Take 1 tablet (81 mg total) by mouth daily. 90 tablet 1  . atorvastatin (LIPITOR) 80 MG tablet Take 1 tablet (80 mg total) by mouth daily. 90 tablet 3  . buPROPion (WELLBUTRIN XL) 150 MG 24 hr tablet Take 1 tablet (150 mg total) by mouth daily. 30 tablet 0  . Cholecalciferol (OPTIMAL-D) 1.25 MG (50000 UT) capsule Take 1 capsule (50,000 Units total) by mouth once a week. 12 capsule 0  . indapamide (LOZOL) 1.25 MG tablet Take 1 tablet (1.25 mg total) by mouth daily. 90 tablet 0  . irbesartan (AVAPRO) 150 MG tablet Take 1 tablet (150 mg total) by mouth daily. 90 tablet 0  . Omega-3 Fatty Acids (FISH OIL) 1000 MG CAPS Take by mouth.    . Potassium Chloride ER 20 MEQ TBCR Take 1 tablet by mouth BID 180 tablet 1  . thiamine (VITAMIN B-1) 100 MG tablet Take 1 tablet (100 mg total) by mouth daily. 90 tablet 1  . DULoxetine (CYMBALTA) 30 MG capsule Take 1 capsule (30 mg total) by mouth daily. 30 capsule 0   No facility-administered medications prior to visit.    ROS Review of Systems  HENT: Negative for trouble swallowing and voice change.   Eyes: Negative.   Respiratory:  Negative for cough, chest tightness, shortness of breath and wheezing.   Cardiovascular: Negative for chest pain, palpitations and leg swelling.  Gastrointestinal: Negative for abdominal pain, diarrhea, nausea and vomiting.  Endocrine: Negative.   Genitourinary: Negative for difficulty urinating.  Musculoskeletal: Negative.  Negative for myalgias.  Skin: Negative.   Neurological: Negative.  Negative for dizziness, weakness and light-headedness.  Hematological: Negative for adenopathy. Does not bruise/bleed easily.  Psychiatric/Behavioral: Positive for dysphoric mood. Negative for agitation, confusion, decreased concentration, self-injury and suicidal ideas. The patient is not nervous/anxious.     Objective:  BP 136/84 (BP Location: Left Arm, Patient Position: Sitting, Cuff Size: Large)   Pulse 89   Temp 98.2 F (36.8 C) (Oral)   Resp 16   Ht 5\' 10"  (1.778 m)   Wt 259 lb (117.5 kg)   SpO2 95%   BMI 37.16 kg/m   BP Readings from Last 3 Encounters:  03/31/19 136/84  03/25/19 (!) 152/90  03/03/19 (!) 152/90    Wt Readings from Last 3 Encounters:  03/31/19 259 lb (117.5 kg)  03/25/19 260 lb (117.9 kg)  03/03/19 264 lb (119.7 kg)    Physical Exam Vitals reviewed.  HENT:     Nose: Nose normal.     Mouth/Throat:     Pharynx: No posterior oropharyngeal erythema.  Eyes:     General: No scleral icterus.  Conjunctiva/sclera: Conjunctivae normal.  Cardiovascular:     Rate and Rhythm: Normal rate and regular rhythm.     Heart sounds: No murmur.  Pulmonary:     Effort: Pulmonary effort is normal.     Breath sounds: No stridor. No wheezing, rhonchi or rales.  Abdominal:     General: Abdomen is protuberant. There is no distension.     Palpations: Abdomen is soft. There is no hepatomegaly, splenomegaly or mass.     Tenderness: There is no abdominal tenderness.  Musculoskeletal:        General: Normal range of motion.     Cervical back: Neck supple.     Right lower leg: No  edema.     Left lower leg: No edema.  Lymphadenopathy:     Cervical: No cervical adenopathy.  Skin:    General: Skin is warm.  Neurological:     General: No focal deficit present.  Psychiatric:        Mood and Affect: Mood normal.        Behavior: Behavior normal.        Thought Content: Thought content normal.        Judgment: Judgment normal.     Lab Results  Component Value Date   WBC 5.2 03/03/2019   HGB 14.4 03/03/2019   HCT 43.6 03/03/2019   PLT 433.0 (H) 03/03/2019   GLUCOSE 180 (H) 03/03/2019   CHOL 178 03/03/2019   TRIG 154.0 (H) 03/03/2019   HDL 42.20 03/03/2019   LDLDIRECT 145.0 08/26/2014   LDLCALC 105 (H) 03/03/2019   ALT 30 03/03/2019   AST 19 03/03/2019   NA 141 03/03/2019   K 3.5 03/03/2019   CL 105 03/03/2019   CREATININE 1.40 03/03/2019   BUN 16 03/03/2019   CO2 30 03/03/2019   TSH 2.44 03/03/2019   PSA 0.59 03/03/2019   INR 1.0 08/19/2018   HGBA1C 6.2 03/03/2019    DG Chest 2 View  Result Date: 08/19/2018 CLINICAL DATA:  Hemoptysis beginning yesterday. EXAM: CHEST - 2 VIEW COMPARISON:  08/26/2014 FINDINGS: Heart size is normal. There is tortuosity of the aorta. The pulmonary vascularity is normal. The lungs are clear. Infiltrate, mass, effusion or collapse. IMPRESSION: No active disease.  No abnormality seen to explain hemoptysis. Electronically Signed   By: Nelson Chimes M.D.   On: 08/19/2018 15:24    Assessment & Plan:   Goodman was seen today for hypertension and depression.  Diagnoses and all orders for this visit:  Essential hypertension- His blood pressure is adequately well controlled.  Moderate episode of recurrent major depressive disorder ([place])- Improvement noted.  I recommended that he increase the dose of duloxetine and stay on the current dose of bupropion. -     DULoxetine (CYMBALTA) 60 MG capsule; Take 1 capsule (60 mg total) by mouth daily.   I have discontinued Dwyane J. Kontz's DULoxetine. I am also having him start on  DULoxetine. Additionally, I am having him maintain his aspirin, irbesartan, indapamide, Fish Oil, buPROPion, Potassium Chloride ER, Optimal-D, thiamine, and atorvastatin.  Meds ordered this encounter  Medications  . DULoxetine (CYMBALTA) 60 MG capsule    Sig: Take 1 capsule (60 mg total) by mouth daily.    Dispense:  90 capsule    Refill:  1     Follow-up: Return in about 3 months (around 06/29/2019).  Scarlette Calico, MD

## 2019-04-07 ENCOUNTER — Telehealth: Payer: Self-pay | Admitting: Acute Care

## 2019-04-07 NOTE — Telephone Encounter (Signed)
Called and spoke with pt clarifying his appt with SG tomorrow 12/30 at 9:30 followed by the CT at [facility] at 10:50. I stated to pt that his PCP ordered the shared decision visit with SG and stated to pt due to [place] not having a CT dept at our office, that is why he had to go to two different locations for the visit with SG and then the CT. Pt verbalized understanding. Nothing further needed.

## 2019-04-08 ENCOUNTER — Ambulatory Visit (INDEPENDENT_AMBULATORY_CARE_PROVIDER_SITE_OTHER): Payer: Medicare HMO | Admitting: Acute Care

## 2019-04-08 ENCOUNTER — Ambulatory Visit
Admission: RE | Admit: 2019-04-08 | Discharge: 2019-04-08 | Disposition: A | Discharge status: Home / Self Care | Payer: Medicare HMO | Source: Ambulatory Visit | Attending: Acute Care | Admitting: Acute Care

## 2019-04-08 ENCOUNTER — Other Ambulatory Visit: Payer: Self-pay

## 2019-04-08 ENCOUNTER — Encounter: Payer: Self-pay | Admitting: Acute Care

## 2019-04-08 VITALS — BP 158/98 | HR 73 | Temp 97.2°F | Ht 71.0 in | Wt 259.8 lb

## 2019-04-08 DIAGNOSIS — F17210 Nicotine dependence, cigarettes, uncomplicated: Secondary | ICD-10-CM | POA: Diagnosis not present

## 2019-04-08 DIAGNOSIS — R69 Illness, unspecified: Secondary | ICD-10-CM | POA: Diagnosis not present

## 2019-04-08 DIAGNOSIS — Z87891 Personal history of nicotine dependence: Secondary | ICD-10-CM

## 2019-04-08 NOTE — Patient Instructions (Signed)
Thank you for participating in the [facility]. It was our pleasure to meet you today. We will call you with the results of your scan within the next few days. Your scan will be assigned a Lung RADS category score by the physicians reading the scans.  This Lung RADS score determines follow up scanning.  See below for description of categories, and follow up screening recommendations. We will be in touch to schedule your follow up screening annually or based on recommendations of our providers. We will fax a copy of your scan results to your Primary Care Physician, or the physician who referred you to the program, to ensure they have the results. Please call the office if you have any questions or concerns regarding your scanning experience or results.  Our office number is [phone]. Please speak with Denise Phelps, RN. She is our Lung Cancer Screening RN. If she is unavailable when you call, please have the office staff send her a message. She will return your call at her earliest convenience. Remember, if your scan is normal, we will scan you annually as long as you continue to meet the criteria for the program. (Age 55-77, Current smoker or smoker who has quit within the last 15 years). If you are a smoker, remember, quitting is the single most powerful action that you can take to decrease your risk of lung cancer and other pulmonary, breathing related problems. We know quitting is hard, and we are here to help.  Please let us know if there is anything we can do to help you meet your goal of quitting. If you are a former smoker, congratulations. We are proud of you! Remain smoke free! Remember you can refer friends or family members through the number above.  We will screen them to make sure they meet criteria for the program. Thank you for helping us take better care of you by participating in Lung Screening.  Lung RADS Categories:  Lung RADS 1: no nodules  or definitely non-concerning nodules.  Recommendation is for a repeat annual scan in 12 months.  Lung RADS 2:  nodules that are non-concerning in appearance and behavior with a very low likelihood of becoming an active cancer. Recommendation is for a repeat annual scan in 12 months.  Lung RADS 3: nodules that are probably non-concerning , includes nodules with a low likelihood of becoming an active cancer.  Recommendation is for a 6-month repeat screening scan. Often noted after an upper respiratory illness. We will be in touch to make sure you have no questions, and to schedule your 6-month scan.  Lung RADS 4 A: nodules with concerning findings, recommendation is most often for a follow up scan in 3 months or additional testing based on our provider's assessment of the scan. We will be in touch to make sure you have no questions and to schedule the recommended 3 month follow up scan.  Lung RADS 4 B:  indicates findings that are concerning. We will be in touch with you to schedule additional diagnostic testing based on our provider's  assessment of the scan.   

## 2019-04-08 NOTE — Progress Notes (Signed)
Shared Decision Making Visit Lung Cancer Screening Program [phone])   Eligibility:  Age 68 y.o.  Pack Years Smoking History Calculation 30 pack year smoking history (# packs/per year x # years smoked)  Recent History of coughing up blood  no  Unexplained weight loss? no ( >Than 15 pounds within the last 6 months )  Prior History Lung / other cancer no (Diagnosis within the last 5 years already requiring surveillance chest CT Scans).  Smoking Status Current Smoker  Former Smokers: Years since quit: NA  Quit Date: NA  Visit Components:  Discussion included one or more decision making aids. yes  Discussion included risk/benefits of screening. yes  Discussion included potential follow up diagnostic testing for abnormal scans. yes  Discussion included meaning and risk of over diagnosis. yes  Discussion included meaning and risk of False Positives. yes  Discussion included meaning of total radiation exposure. yes  Counseling Included:  Importance of adherence to annual lung cancer LDCT screening. yes  Impact of comorbidities on ability to participate in the program. yes  Ability and willingness to under diagnostic treatment. yes  Smoking Cessation Counseling:  Current Smokers:   Discussed importance of smoking cessation. yes  Information about tobacco cessation classes and interventions provided to patient. yes  Patient provided with "ticket" for LDCT Scan. yes  Symptomatic Patient. no  Counseling  Diagnosis Code: Tobacco Use Z72.0  Asymptomatic Patient yes  Counseling (Intermediate counseling: > three minutes counseling) [id number]  Former Smokers:   Discussed the importance of maintaining cigarette abstinence. yes  Diagnosis Code: Personal History of Nicotine Dependence. [id number]  Information about tobacco cessation classes and interventions provided to patient. Yes  Patient provided with "ticket" for LDCT Scan. yes  Written Order for Lung Cancer  Screening with LDCT placed in Epic. Yes (CT Chest Lung Cancer Screening Low Dose W/O CM) [id number] Z12.2-Screening of respiratory organs Z87.891-Personal history of nicotine dependence  BP (!) 158/98 (BP Location: Left Arm, Cuff Size: Large)   Pulse 73   Temp (!) 97.2 F (36.2 C) (Oral)   Ht 5\' 11"  (1.803 m)   Wt 259 lb 12.8 oz (117.8 kg)   SpO2 97%   BMI 36.23 kg/m    I have spent 25 minutes of face to face time with [name] discussing the risks and benefits of lung cancer screening. We viewed a power point together that explained in detail the above noted topics. We paused at intervals to allow for questions to be asked and answered to ensure understanding.We discussed that the single most powerful action that he can take to decrease his risk of developing lung cancer is to quit smoking. We discussed whether or not he is ready to commit to setting a quit date. We discussed options for tools to aid in quitting smoking including nicotine replacement therapy, non-nicotine medications, support groups, Quit Smart classes, and behavior modification. We discussed that often times setting smaller, more achievable goals, such as eliminating 1 cigarette a day for a week and then 2 cigarettes a day for a week can be helpful in slowly decreasing the number of cigarettes smoked. This allows for a sense of accomplishment as well as providing a clinical benefit. I gave him the " Be Stronger Than Your Excuses" card with contact information for community resources, classes, free nicotine replacement therapy, and access to mobile apps, text messaging, and on-line smoking cessation help. I have also given him my card and contact information in the event he needs to contact me. We  discussed the time and location of the scan, and that either Doroteo Glassman RN or I will call with the results within 24-48 hours of receiving them. I have offered him  a copy of the power point we viewed  as a resource in the event they  need reinforcement of the concepts we discussed today in the office. The patient verbalized understanding of all of  the above and had no further questions upon leaving the office. They have my contact information in the event they have any further questions.  I spent 4 minutes counseling on smoking cessation and the health risks of continued tobacco abuse.  I explained to the patient that there has been a high incidence of coronary artery disease noted on these exams. I explained that this is a non-gated exam therefore degree or severity cannot be determined. This patient is currently on statin therapy. I have asked the patient to follow-up with their PCP regarding any incidental finding of coronary artery disease and management with diet or medication as their PCP  feels is clinically indicated. The patient verbalized understanding of the above and had no further questions upon completion of the visit.  Magdalen Spatz, NP 04/08/2019 5:10 PM

## 2019-04-13 ENCOUNTER — Other Ambulatory Visit: Payer: Self-pay | Admitting: *Deleted

## 2019-04-13 DIAGNOSIS — F17210 Nicotine dependence, cigarettes, uncomplicated: Secondary | ICD-10-CM

## 2019-04-13 DIAGNOSIS — Z87891 Personal history of nicotine dependence: Secondary | ICD-10-CM

## 2019-04-20 ENCOUNTER — Ambulatory Visit (INDEPENDENT_AMBULATORY_CARE_PROVIDER_SITE_OTHER): Payer: Medicare HMO

## 2019-04-20 ENCOUNTER — Other Ambulatory Visit: Payer: Self-pay

## 2019-04-20 DIAGNOSIS — E538 Deficiency of other specified B group vitamins: Secondary | ICD-10-CM | POA: Diagnosis not present

## 2019-04-20 MED ORDER — CYANOCOBALAMIN 1000 MCG/ML IJ SOLN
1000.0000 ug | Freq: Once | INTRAMUSCULAR | Status: AC
  Administered 2019-04-20: 1000 ug via INTRAMUSCULAR

## 2019-04-20 MED ORDER — CYANOCOBALAMIN 1000 MCG/ML IJ SOLN: 1000.0000 ug | Freq: Once | INTRAMUSCULAR | Status: DC

## 2019-04-20 NOTE — Progress Notes (Signed)
I have reviewed and agree.

## 2019-05-20 ENCOUNTER — Other Ambulatory Visit: Payer: Medicare HMO

## 2019-05-21 ENCOUNTER — Ambulatory Visit (INDEPENDENT_AMBULATORY_CARE_PROVIDER_SITE_OTHER): Payer: Medicare HMO | Admitting: *Deleted

## 2019-05-21 ENCOUNTER — Other Ambulatory Visit: Payer: Self-pay

## 2019-05-21 DIAGNOSIS — E538 Deficiency of other specified B group vitamins: Secondary | ICD-10-CM

## 2019-05-21 MED ORDER — CYANOCOBALAMIN 1000 MCG/ML IJ SOLN
1000.0000 ug | Freq: Once | INTRAMUSCULAR | Status: AC
  Administered 2019-05-21: 10:00:00 1000 ug via INTRAMUSCULAR

## 2019-05-21 NOTE — Progress Notes (Signed)
I have reviewed and agree.

## 2019-06-08 ENCOUNTER — Emergency Department (HOSPITAL_COMMUNITY): Payer: Medicare HMO

## 2019-06-08 ENCOUNTER — Emergency Department (HOSPITAL_COMMUNITY)
Admission: EM | Admit: 2019-06-08 | Discharge: 2019-06-09 | Disposition: A | Discharge status: Home / Self Care | Payer: Medicare HMO | Attending: Emergency Medicine | Admitting: Emergency Medicine

## 2019-06-08 ENCOUNTER — Other Ambulatory Visit: Payer: Self-pay

## 2019-06-08 ENCOUNTER — Encounter (HOSPITAL_COMMUNITY): Payer: Self-pay | Admitting: Emergency Medicine

## 2019-06-08 DIAGNOSIS — R109 Unspecified abdominal pain: Secondary | ICD-10-CM | POA: Diagnosis not present

## 2019-06-08 DIAGNOSIS — F17210 Nicotine dependence, cigarettes, uncomplicated: Secondary | ICD-10-CM | POA: Diagnosis not present

## 2019-06-08 DIAGNOSIS — Z87442 Personal history of urinary calculi: Secondary | ICD-10-CM | POA: Diagnosis not present

## 2019-06-08 DIAGNOSIS — I129 Hypertensive chronic kidney disease with stage 1 through stage 4 chronic kidney disease, or unspecified chronic kidney disease: Secondary | ICD-10-CM | POA: Insufficient documentation

## 2019-06-08 DIAGNOSIS — N200 Calculus of kidney: Secondary | ICD-10-CM | POA: Diagnosis not present

## 2019-06-08 DIAGNOSIS — K8020 Calculus of gallbladder without cholecystitis without obstruction: Secondary | ICD-10-CM | POA: Diagnosis not present

## 2019-06-08 DIAGNOSIS — N189 Chronic kidney disease, unspecified: Secondary | ICD-10-CM | POA: Diagnosis not present

## 2019-06-08 DIAGNOSIS — R1031 Right lower quadrant pain: Secondary | ICD-10-CM | POA: Diagnosis present

## 2019-06-08 DIAGNOSIS — I259 Chronic ischemic heart disease, unspecified: Secondary | ICD-10-CM | POA: Insufficient documentation

## 2019-06-08 DIAGNOSIS — R69 Illness, unspecified: Secondary | ICD-10-CM | POA: Diagnosis not present

## 2019-06-08 DIAGNOSIS — J449 Chronic obstructive pulmonary disease, unspecified: Secondary | ICD-10-CM | POA: Diagnosis not present

## 2019-06-08 LAB — CBC WITH DIFFERENTIAL/PLATELET
Abs Immature Granulocytes: 0.01 10*3/uL (ref 0.00–0.07)
Basophils Absolute: 0 10*3/uL (ref 0.0–0.1)
Basophils Relative: 1 %
Eosinophils Absolute: 0.1 10*3/uL (ref 0.0–0.5)
Eosinophils Relative: 2 %
HCT: 45.8 % (ref 39.0–52.0)
Hemoglobin: 15.2 g/dL (ref 13.0–17.0)
Immature Granulocytes: 0 %
Lymphocytes Relative: 34 %
Lymphs Abs: 1.9 10*3/uL (ref 0.7–4.0)
MCH: 32.1 pg (ref 26.0–34.0)
MCHC: 33.2 g/dL (ref 30.0–36.0)
MCV: 96.8 fL (ref 80.0–100.0)
Monocytes Absolute: 0.4 10*3/uL (ref 0.1–1.0)
Monocytes Relative: 8 %
Neutro Abs: 3 10*3/uL (ref 1.7–7.7)
Neutrophils Relative %: 55 %
Platelets: 484 10*3/uL — ABNORMAL HIGH (ref 150–400)
RBC: 4.73 MIL/uL (ref 4.22–5.81)
RDW: 14.5 % (ref 11.5–15.5)
WBC: 5.4 10*3/uL (ref 4.0–10.5)
nRBC: 0 % (ref 0.0–0.2)

## 2019-06-08 LAB — COMPREHENSIVE METABOLIC PANEL
ALT: 30 U/L (ref 0–44)
AST: 21 U/L (ref 15–41)
Albumin: 4.1 g/dL (ref 3.5–5.0)
Alkaline Phosphatase: 66 U/L (ref 38–126)
Anion gap: 8 (ref 5–15)
BUN: 23 mg/dL (ref 8–23)
CO2: 25 mmol/L (ref 22–32)
Calcium: 9.3 mg/dL (ref 8.9–10.3)
Chloride: 111 mmol/L (ref 98–111)
Creatinine, Ser: 1.59 mg/dL — ABNORMAL HIGH (ref 0.61–1.24)
GFR calc Af Amer: 51 mL/min — ABNORMAL LOW (ref >60)
GFR calc non Af Amer: 44 mL/min — ABNORMAL LOW (ref >60)
Glucose, Bld: 112 mg/dL — ABNORMAL HIGH (ref 70–99)
Potassium: 4 mmol/L (ref 3.5–5.1)
Sodium: 144 mmol/L (ref 135–145)
Total Bilirubin: 0.7 mg/dL (ref 0.3–1.2)
Total Protein: 7.6 g/dL (ref 6.5–8.1)

## 2019-06-08 LAB — URINALYSIS, ROUTINE W REFLEX MICROSCOPIC
Bilirubin Urine: NEGATIVE
Glucose, UA: NEGATIVE mg/dL
Hgb urine dipstick: NEGATIVE
Ketones, ur: NEGATIVE mg/dL
Leukocytes,Ua: NEGATIVE
Nitrite: NEGATIVE
Protein, ur: NEGATIVE mg/dL
Specific Gravity, Urine: 1.025 (ref 1.005–1.030)
pH: 5 (ref 5.0–8.0)

## 2019-06-08 MED ORDER — ACETAMINOPHEN 500 MG PO TABS
1000.0000 mg | ORAL_TABLET | Freq: Once | ORAL | Status: AC
  Administered 2019-06-09: 1000 mg via ORAL
  Filled 2019-06-08: qty 2
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
## 2019-06-08 NOTE — ED Notes (Signed)
Pt given urinal and is attempting to void.

## 2019-06-08 NOTE — Discharge Instructions (Addendum)
You were seen in the ED today with pain in the right side. Your labs and CT scans were normal.  Please follow-up with your primary care doctor tomorrow by phone and return to the emergency department any new or suddenly worsening symptoms.

## 2019-06-08 NOTE — ED Notes (Addendum)
Full rainbow sent to lab.  

## 2019-06-08 NOTE — ED Notes (Signed)
Patient was verbalized discharge instructions. Pt had no further questions at this time. NAD. 

## 2019-06-08 NOTE — ED Triage Notes (Signed)
Patient c/o right flank pain x2 hours. Hx kidney stones.

## 2019-06-08 NOTE — ED Notes (Addendum)
Patient requesting pain medication at time of discharge. MD made aware.

## 2019-06-09 ENCOUNTER — Telehealth: Payer: Self-pay | Admitting: Internal Medicine

## 2019-06-09 MED ORDER — KETOROLAC TROMETHAMINE 30 MG/ML IJ SOLN
30.0000 mg | Freq: Once | INTRAMUSCULAR | Status: AC
  Administered 2019-06-09: 30 mg via INTRAMUSCULAR
  Filled 2019-06-09: qty 1

## 2019-06-09 MED ORDER — KETOROLAC TROMETHAMINE 30 MG/ML IJ SOLN: 30.0000 mg | Freq: Once | INTRAMUSCULAR | Status: DC

## 2019-06-09 MED ORDER — LIDOCAINE 5 % EX OINT: 1.0000 "application " | TOPICAL_OINTMENT | Freq: Three times a day (TID) | CUTANEOUS | 0 refills | Status: DC | PRN

## 2019-06-09 NOTE — ED Notes (Signed)
Patient refusing to leave until he speaks with the MD. MD at bedside.

## 2019-06-09 NOTE — Progress Notes (Signed)
°  Chronic Care Management   Outreach Note  06/09/2019 Name: James Sawyer MRN: [id number] DOB: 1951-02-01  Referred by: Janith Lima, MD Reason for referral : No chief complaint on file.   An unsuccessful telephone outreach was attempted today. The patient was referred to the pharmacist for assistance with care management and care coordination.   Follow Up Plan:   Raynicia Dukes [employer] Scheduler
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
## 2019-06-09 NOTE — ED Notes (Signed)
Dr Long at bedside

## 2019-06-10 LAB — URINE CULTURE: Culture: <10000 — AB

## 2019-06-15 ENCOUNTER — Other Ambulatory Visit: Payer: Self-pay

## 2019-06-15 ENCOUNTER — Encounter: Payer: Self-pay | Admitting: Internal Medicine

## 2019-06-15 ENCOUNTER — Ambulatory Visit (INDEPENDENT_AMBULATORY_CARE_PROVIDER_SITE_OTHER): Payer: Medicare HMO | Admitting: Internal Medicine

## 2019-06-15 VITALS — BP 142/82 | HR 69 | Temp 98.2°F | Resp 16 | Ht 71.0 in | Wt 255.1 lb

## 2019-06-15 DIAGNOSIS — K8020 Calculus of gallbladder without cholecystitis without obstruction: Secondary | ICD-10-CM | POA: Diagnosis not present

## 2019-06-15 DIAGNOSIS — I10 Essential (primary) hypertension: Secondary | ICD-10-CM

## 2019-06-15 NOTE — Progress Notes (Signed)
Subjective:  Patient ID: James Sawyer, male    DOB: 1951/01/11  Age: 69 y.o. MRN: [id number]  CC: Flank Pain  This visit occurred during the SARS-CoV-2 public health emergency.  Safety protocols were in place, including screening questions prior to the visit, additional usage of staff PPE, and extensive cleaning of exam room while observing appropriate contact time as indicated for disinfecting solutions.    HPI James Sawyer presents for f/up - He was seen in the ED about 8 days ago for acute onset right side pain. A CT showed stones in the kidneys but none in the ureters and gallstones. The pain has resolved.  Outpatient Medications Prior to Visit  Medication Sig Dispense Refill  . aspirin 81 MG tablet Take 1 tablet (81 mg total) by mouth daily. 90 tablet 1  . atorvastatin (LIPITOR) 80 MG tablet Take 1 tablet (80 mg total) by mouth daily. 90 tablet 3  . buPROPion (WELLBUTRIN XL) 150 MG 24 hr tablet Take 1 tablet (150 mg total) by mouth daily. 30 tablet 0  . Cholecalciferol (OPTIMAL-D) 1.25 MG (50000 UT) capsule Take 1 capsule (50,000 Units total) by mouth once a week. 12 capsule 0  . DULoxetine (CYMBALTA) 60 MG capsule Take 1 capsule (60 mg total) by mouth daily. 90 capsule 1  . indapamide (LOZOL) 1.25 MG tablet Take 1 tablet (1.25 mg total) by mouth daily. 90 tablet 0  . irbesartan (AVAPRO) 150 MG tablet Take 1 tablet (150 mg total) by mouth daily. 90 tablet 0  . lidocaine (XYLOCAINE) 5 % ointment Apply 1 application topically 3 (three) times daily as needed. 35.44 g 0  . Omega-3 Fatty Acids (FISH OIL) 1000 MG CAPS Take by mouth.    . Potassium Chloride ER 20 MEQ TBCR Take 1 tablet by mouth BID 180 tablet 1  . potassium chloride SA (KLOR-CON) 20 MEQ tablet Take 20 mEq by mouth 2 (two) times daily.    Marland Kitchen thiamine (VITAMIN B-1) 100 MG tablet Take 1 tablet (100 mg total) by mouth daily. 90 tablet 1   Facility-Administered Medications Prior to Visit  Medication Dose Route Frequency  Provider Last Rate Last Admin  . cyanocobalamin ((VITAMIN B-12)) injection 1,000 mcg  1,000 mcg Intramuscular Once Janith Lima, MD        ROS Review of Systems  Constitutional: Negative for chills, diaphoresis, fatigue and fever.  HENT: Negative.   Eyes: Negative.   Respiratory: Negative for cough, chest tightness, shortness of breath and wheezing.   Cardiovascular: Negative for chest pain, palpitations and leg swelling.  Gastrointestinal: Negative for abdominal pain, constipation, diarrhea and nausea.  Endocrine: Negative.   Genitourinary: Negative for difficulty urinating, dysuria, frequency and hematuria.  Musculoskeletal: Negative.   Skin: Negative.  Negative for color change and pallor.  Neurological: Negative for dizziness, weakness, light-headedness and headaches.  Hematological: Negative for adenopathy. Does not bruise/bleed easily.  Psychiatric/Behavioral: Negative.     Objective:  BP (!) 142/82 (BP Location: Right Arm, Patient Position: Sitting, Cuff Size: Normal)   Pulse 69   Temp 98.2 F (36.8 C) (Oral)   Resp 16   Ht 5\' 11"  (1.803 m)   Wt 255 lb 2 oz (115.7 kg)   SpO2 94%   BMI 35.58 kg/m   BP Readings from Last 3 Encounters:  06/15/19 (!) 142/82  06/08/19 (!) 184/96  04/08/19 (!) 158/98    Wt Readings from Last 3 Encounters:  06/15/19 255 lb 2 oz (115.7 kg)  06/08/19 255  lb (115.7 kg)  04/08/19 259 lb 12.8 oz (117.8 kg)    Physical Exam Vitals reviewed.  Constitutional:      Appearance: He is obese. He is not ill-appearing.  HENT:     Nose: Nose normal.     Mouth/Throat:     Mouth: Mucous membranes are moist.  Eyes:     General: No scleral icterus.    Conjunctiva/sclera: Conjunctivae normal.  Cardiovascular:     Rate and Rhythm: Normal rate and regular rhythm.     Heart sounds: No murmur.  Pulmonary:     Effort: Pulmonary effort is normal.     Breath sounds: No stridor. No wheezing, rhonchi or rales.  Abdominal:     General: Abdomen is  protuberant. Bowel sounds are normal. There is no distension.     Palpations: Abdomen is soft. There is no hepatomegaly, splenomegaly or mass.     Tenderness: There is no abdominal tenderness.  Musculoskeletal:        General: Normal range of motion.     Cervical back: Neck supple.     Right lower leg: No edema.     Left lower leg: No edema.  Lymphadenopathy:     Cervical: No cervical adenopathy.  Skin:    General: Skin is warm and dry.     Coloration: Skin is not pale.  Neurological:     General: No focal deficit present.     Mental Status: He is alert.  Psychiatric:        Mood and Affect: Mood normal.        Behavior: Behavior normal.     Lab Results  Component Value Date   WBC 5.4 06/08/2019   HGB 15.2 06/08/2019   HCT 45.8 06/08/2019   PLT 484 (H) 06/08/2019   GLUCOSE 112 (H) 06/08/2019   CHOL 178 03/03/2019   TRIG 154.0 (H) 03/03/2019   HDL 42.20 03/03/2019   LDLDIRECT 145.0 08/26/2014   LDLCALC 105 (H) 03/03/2019   ALT 30 06/08/2019   AST 21 06/08/2019   NA 144 06/08/2019   K 4.0 06/08/2019   CL 111 06/08/2019   CREATININE 1.59 (H) 06/08/2019   BUN 23 06/08/2019   CO2 25 06/08/2019   TSH 2.44 03/03/2019   PSA 0.59 03/03/2019   INR 1.0 08/19/2018   HGBA1C 6.2 03/03/2019    CT Renal Stone Study  Result Date: 06/08/2019 CLINICAL DATA:  Right-sided flank pain EXAM: CT ABDOMEN AND PELVIS WITHOUT CONTRAST TECHNIQUE: Multidetector CT imaging of the abdomen and pelvis was performed following the standard protocol without IV contrast. COMPARISON:  04/25/2014 FINDINGS: Lower chest: Lung bases demonstrate no acute consolidation or pleural effusion. Heart size within normal limits. Coronary vascular calcification. Hepatobiliary: Stable subcentimeter hypodensity at the dome of the right lobe. Small cyst in the inferior right hepatic lobe. Calcified gallstones. No biliary dilatation. Pancreas: Unremarkable. No pancreatic ductal dilatation or surrounding inflammatory changes.  Spleen: Normal in size without focal abnormality. Adrenals/Urinary Tract: Adrenal glands are normal. No hydronephrosis or ureteral stone. Intrarenal vascular calcifications. Punctate stone lower pole left kidney. Multiple stones in the right kidney, the largest is seen in the lower pole and measures 4 mm. Bladder is thick walled and nearly empty. Stomach/Bowel: Stomach is within normal limits. Appendix appears normal. No evidence of bowel wall thickening, distention, or inflammatory changes. Vascular/Lymphatic: Moderate aortic atherosclerosis. No aneurysm. No significant adenopathy Reproductive: Enlarged prostate. Other: Negative for free air or free fluid. Musculoskeletal: No acute or significant osseous findings. IMPRESSION:  1. Intrarenal stones but negative for hydronephrosis or ureteral stone. 2. Gallstones Electronically Signed   By: Donavan Foil M.D.   On: 06/08/2019 23:36    Assessment & Plan:   Lazerrick was seen today for flank pain.  Diagnoses and all orders for this visit:  Calculus of gallbladder without cholecystitis without obstruction- I am concerned that this may have caused the pain. I have asked him to see GS to consider cholecystectomy. -     Ambulatory referral to General Surgery  Essential hypertension- His BP is adequately well controlled.   I am having Bethann Humble maintain his aspirin, irbesartan, indapamide, Fish Oil, buPROPion, Potassium Chloride ER, Optimal-D, thiamine, atorvastatin, DULoxetine, lidocaine, and potassium chloride SA. We will continue to administer cyanocobalamin.  No orders of the defined types were placed in this encounter.    Follow-up: Return in about 3 months (around 09/15/2019).  Scarlette Calico, MD

## 2019-06-15 NOTE — Patient Instructions (Signed)
Flank Pain, Adult Flank pain is pain that is located on the side of the body between the upper abdomen and the back. This area is called the flank. The pain may occur over a short period of time (acute), or it may be long-term or recurring (chronic). It may be mild or severe. Flank pain can be caused by many things, including:  Muscle soreness or injury.  Kidney stones or kidney disease.  Stress.  A disease of the spine (vertebral disk disease).  A lung infection (pneumonia).  Fluid around the lungs (pulmonary edema).  A skin rash caused by the chickenpox virus (shingles).  Tumors that affect the back of the abdomen.  Gallbladder disease. Follow these instructions at home:   Drink enough fluid to keep your urine clear or pale yellow.  Rest as told by your health care provider.  Take over-the-counter and prescription medicines only as told by your health care provider.  Keep a journal to track what has caused your flank pain and what has made it feel better.  Keep all follow-up visits as told by your health care provider. This is important. Contact a health care provider if:  Your pain is not controlled with medicine.  You have new symptoms.  Your pain gets worse.  You have a fever.  Your symptoms last longer than 2-3 days.  You have trouble urinating or you are urinating very frequently. Get help right away if:  You have trouble breathing or you are short of breath.  Your abdomen hurts or it is swollen or red.  You have nausea or vomiting.  You feel faint or you pass out.  You have blood in your urine. Summary  Flank pain is pain that is located on the side of the body between the upper abdomen and the back.  The pain may occur over a short period of time (acute), or it may be long-term or recurring (chronic). It may be mild or severe.  Flank pain can be caused by many things.  Contact your health care provider if your symptoms get worse or they last  longer than 2-3 days. This information is not intended to replace advice given to you by your health care provider. Make sure you discuss any questions you have with your health care provider. Document Revised: 03/08/2017 Document Reviewed: 06/08/2016 Elsevier Patient Education  2020 Elsevier Inc.  

## 2019-06-18 ENCOUNTER — Ambulatory Visit (INDEPENDENT_AMBULATORY_CARE_PROVIDER_SITE_OTHER): Payer: Medicare HMO | Admitting: *Deleted

## 2019-06-18 ENCOUNTER — Other Ambulatory Visit: Payer: Self-pay

## 2019-06-18 ENCOUNTER — Telehealth: Payer: Self-pay | Admitting: *Deleted

## 2019-06-18 ENCOUNTER — Other Ambulatory Visit: Payer: Self-pay | Admitting: Internal Medicine

## 2019-06-18 DIAGNOSIS — E538 Deficiency of other specified B group vitamins: Secondary | ICD-10-CM

## 2019-06-18 MED ORDER — CYANOCOBALAMIN 2000 MCG PO TABS: 2000.0000 ug | ORAL_TABLET | Freq: Every day | ORAL | 1 refills | Status: DC

## 2019-06-18 MED ORDER — CYANOCOBALAMIN 1000 MCG/ML IJ SOLN
1000.0000 ug | Freq: Once | INTRAMUSCULAR | Status: AC
  Administered 2019-06-18: 11:00:00 1000 ug via INTRAMUSCULAR

## 2019-06-18 NOTE — Progress Notes (Addendum)
I have reviewed and agree  Pls cosign for B12 inj../lmb  

## 2019-06-18 NOTE — Telephone Encounter (Signed)
Pt came in for his B12 inj he is wanting to know if he need to continue taking the injection. If he can take the B12 supplements over the counter.Marland KitchenJohny Sawyer

## 2019-06-18 NOTE — Telephone Encounter (Signed)
Supplement sent

## 2019-06-19 NOTE — Telephone Encounter (Signed)
Tried calling pt no answer x's 10 rings.../lmb 

## 2019-06-19 NOTE — Telephone Encounter (Signed)
Notified pt w/MD response.../lmb 

## 2019-06-25 ENCOUNTER — Telehealth: Payer: Self-pay | Admitting: Internal Medicine

## 2019-06-25 NOTE — Progress Notes (Signed)
  Chronic Care Management   Outreach Note  06/25/2019 Name: James Sawyer MRN: [id number] DOB: [birth year]  Referred by: Janith Lima, MD Reason for referral : No chief complaint on file.   A second unsuccessful telephone outreach was attempted today. The patient was referred to pharmacist for assistance with care management and care coordination.  Follow Up Plan:   Raynicia Dukes [employer] Scheduler

## 2019-06-29 ENCOUNTER — Ambulatory Visit: Payer: Medicare HMO | Admitting: Internal Medicine

## 2019-07-01 ENCOUNTER — Other Ambulatory Visit: Payer: Self-pay | Admitting: Internal Medicine

## 2019-07-01 ENCOUNTER — Telehealth: Payer: Self-pay

## 2019-07-01 DIAGNOSIS — E559 Vitamin D deficiency, unspecified: Secondary | ICD-10-CM

## 2019-07-01 DIAGNOSIS — F331 Major depressive disorder, recurrent, moderate: Secondary | ICD-10-CM

## 2019-07-01 MED ORDER — BUPROPION HCL ER (XL) 150 MG PO TB24: 150.0000 mg | ORAL_TABLET | Freq: Every day | ORAL | 0 refills | Status: DC

## 2019-07-01 MED ORDER — OPTIMAL-D 1.25 MG (50000 UT) PO CAPS: 50000.0000 [IU] | ORAL_CAPSULE | ORAL | 0 refills | Status: DC

## 2019-07-01 NOTE — Telephone Encounter (Signed)
New message    The patient's wife calling stating the next available appt to see the surgeon for a consult is 4.8.21 then  4.18.21 the patient is in a lot of pain.   What the next steps they need to do either pain medication or hospitalization.

## 2019-07-01 NOTE — Telephone Encounter (Signed)
1.Medication Requested: - need prior authorization    DULoxetine (CYMBALTA) 60 MG capsule  Cholecalciferol (OPTIMAL-D) 1.25 MG (50000 UT) capsule  buPROPion (WELLBUTRIN XL) 150 MG 24 hr tablet  2. Pharmacy (Name, Street, [place]):[facility] ([facility]), [place] [address]  3. On Med List: Yes   4. Last Visit with PCP:  3.8.2021   5. Next visit date with PCP: 6.8.2021    Agent: Please be advised that RX refills may take up to 3 business days. We ask that you follow-up with your pharmacy.

## 2019-07-02 NOTE — Telephone Encounter (Signed)
Call pt and spouse. No answer or vm to lvm.

## 2019-07-20 ENCOUNTER — Ambulatory Visit: Payer: Medicare HMO | Admitting: Internal Medicine

## 2019-09-15 ENCOUNTER — Ambulatory Visit: Payer: Medicare HMO | Admitting: Internal Medicine

## 2019-09-15 DIAGNOSIS — Z0289 Encounter for other administrative examinations: Secondary | ICD-10-CM

## 2019-09-18 ENCOUNTER — Telehealth: Payer: Self-pay | Admitting: Internal Medicine

## 2019-09-18 NOTE — Progress Notes (Signed)
  Chronic Care Management   Outreach Note  09/18/2019 Name: NIMAI BURBACH MRN: [id number] DOB: [birth year]  Referred by: Janith Lima, MD Reason for referral : No chief complaint on file.   An unsuccessful telephone outreach was attempted today. The patient was referred to the pharmacist for assistance with care management and care coordination.   This note is not being shared with the patient for the following reason: To respect privacy (The patient or proxy has requested that the information not be shared).  Follow Up Plan:   Earney Hamburg [employer] Scheduler

## 2019-12-15 NOTE — Progress Notes (Signed)
Pt left before being seen

## 2019-12-16 ENCOUNTER — Encounter: Payer: Self-pay | Admitting: Internal Medicine

## 2019-12-16 ENCOUNTER — Ambulatory Visit: Payer: Medicare HMO | Admitting: Internal Medicine

## 2019-12-16 ENCOUNTER — Other Ambulatory Visit: Payer: Self-pay

## 2019-12-16 VITALS — BP 152/90 | HR 70 | Ht 70.0 in | Wt 254.0 lb

## 2019-12-16 DIAGNOSIS — I10 Essential (primary) hypertension: Secondary | ICD-10-CM

## 2019-12-17 ENCOUNTER — Telehealth: Payer: Self-pay | Admitting: Internal Medicine
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
## 2019-12-17 NOTE — Telephone Encounter (Signed)
Patient calling to speak with Michalene. He states they spoke yesterday but he had to leave before being seen by Dr. Harrington Challenger because his wife had to go to work. Please advise.

## 2019-12-17 NOTE — Telephone Encounter (Signed)
Called patient back and scheduled him tomorrow w Dr. Harrington Challenger.

## 2019-12-18 ENCOUNTER — Encounter: Payer: Self-pay | Admitting: Internal Medicine

## 2019-12-18 ENCOUNTER — Other Ambulatory Visit: Payer: Self-pay

## 2019-12-18 ENCOUNTER — Ambulatory Visit (INDEPENDENT_AMBULATORY_CARE_PROVIDER_SITE_OTHER): Payer: Medicare HMO | Admitting: Internal Medicine

## 2019-12-18 VITALS — BP 154/102 | HR 94 | Ht 70.0 in | Wt 250.4 lb

## 2019-12-18 DIAGNOSIS — I2510 Atherosclerotic heart disease of native coronary artery without angina pectoris: Secondary | ICD-10-CM

## 2019-12-18 NOTE — Patient Instructions (Signed)
Medication Instructions:  No changes *If you need a refill on your cardiac medications before your next appointment, please call your pharmacy*   Lab Work: Lipids today  If you have labs (blood work) drawn today and your tests are completely normal, you will receive your results only by: Marland Kitchen MyChart Message (if you have MyChart) OR . A paper copy in the mail If you have any lab test that is abnormal or we need to change your treatment, we will call you to review the results.   Testing/Procedures: none   Other Instructions

## 2019-12-19 LAB — LIPID PANEL
Chol/HDL Ratio: 3.7 ratio (ref 0.0–5.0)
Cholesterol, Total: 184 mg/dL (ref 100–199)
HDL: 50 mg/dL (ref >39)
LDL Chol Calc (NIH): 115 mg/dL — ABNORMAL HIGH (ref 0–99)
Triglycerides: 104 mg/dL (ref 0–149)
VLDL Cholesterol Cal: 19 mg/dL (ref 5–40)

## 2019-12-20 ENCOUNTER — Telehealth: Payer: Self-pay | Admitting: Internal Medicine

## 2019-12-21 ENCOUNTER — Telehealth: Payer: Self-pay | Admitting: *Deleted

## 2019-12-21 DIAGNOSIS — I2510 Atherosclerotic heart disease of native coronary artery without angina pectoris: Secondary | ICD-10-CM

## 2019-12-21 DIAGNOSIS — E785 Hyperlipidemia, unspecified: Secondary | ICD-10-CM

## 2019-12-21 DIAGNOSIS — I10 Essential (primary) hypertension: Secondary | ICD-10-CM

## 2019-12-21 MED ORDER — EZETIMIBE 10 MG PO TABS: 10.0000 mg | ORAL_TABLET | Freq: Every day | ORAL | 3 refills | Status: DC

## 2019-12-21 MED ORDER — AMLODIPINE BESYLATE 5 MG PO TABS: 5.0000 mg | ORAL_TABLET | Freq: Every day | ORAL | 3 refills | Status: DC

## 2019-12-21 NOTE — Telephone Encounter (Signed)
-----   Message from Fay Records, MD sent at 12/20/2019 10:14 PM EDT ----- LDL is higher than it should be   COuld  try Zetia 10 mg with largest meal Keep on lpitor 80 mg    F/U lipids in 8 wks  Also :  REcomm adding amlodipine 5 mg to regimen   F?U in HTN in 3 to 4 wks Bring meds

## 2019-12-21 NOTE — Telephone Encounter (Signed)
Spoke with the patient and reviewed results/recommendations. We have scheduled f/u HTN clinic appointment and f/u lab appointment.  Will send zetia and amlodipine prescriptions to [facility]. The patient may not be able to pick these up right away due to cost.  I adv him to call the pharmacy to find out prices before going in.

## 2019-12-25 DIAGNOSIS — Z604 Social exclusion and rejection: Secondary | ICD-10-CM | POA: Diagnosis not present

## 2019-12-25 DIAGNOSIS — G4733 Obstructive sleep apnea (adult) (pediatric): Secondary | ICD-10-CM | POA: Diagnosis not present

## 2019-12-25 DIAGNOSIS — H269 Unspecified cataract: Secondary | ICD-10-CM | POA: Diagnosis not present

## 2019-12-25 DIAGNOSIS — E785 Hyperlipidemia, unspecified: Secondary | ICD-10-CM | POA: Diagnosis not present

## 2019-12-25 DIAGNOSIS — K08109 Complete loss of teeth, unspecified cause, unspecified class: Secondary | ICD-10-CM | POA: Diagnosis not present

## 2019-12-25 DIAGNOSIS — R69 Illness, unspecified: Secondary | ICD-10-CM | POA: Diagnosis not present

## 2019-12-25 DIAGNOSIS — Z008 Encounter for other general examination: Secondary | ICD-10-CM | POA: Diagnosis not present

## 2019-12-25 DIAGNOSIS — E669 Obesity, unspecified: Secondary | ICD-10-CM | POA: Diagnosis not present

## 2019-12-25 DIAGNOSIS — Z7982 Long term (current) use of aspirin: Secondary | ICD-10-CM | POA: Diagnosis not present

## 2019-12-25 DIAGNOSIS — I10 Essential (primary) hypertension: Secondary | ICD-10-CM | POA: Diagnosis not present

## 2019-12-29 NOTE — Telephone Encounter (Signed)
Error

## 2020-01-22 ENCOUNTER — Telehealth: Payer: Self-pay | Admitting: *Deleted

## 2020-01-22 DIAGNOSIS — I10 Essential (primary) hypertension: Secondary | ICD-10-CM

## 2020-01-22 NOTE — Telephone Encounter (Signed)
No answer, no voice mail. Called patient to follow up on plan for HTN clinic.  Do not see where appointment was scheduled.  Referral placed.  Will send message to [facility] to try to reach patient.

## 2020-02-01 ENCOUNTER — Encounter: Payer: Self-pay | Admitting: Internal Medicine

## 2020-02-02 ENCOUNTER — Encounter: Payer: Self-pay | Admitting: General Practice

## 2020-02-23 ENCOUNTER — Other Ambulatory Visit: Payer: Self-pay

## 2020-02-23 ENCOUNTER — Other Ambulatory Visit: Payer: Medicare HMO

## 2020-02-23 DIAGNOSIS — I2510 Atherosclerotic heart disease of native coronary artery without angina pectoris: Secondary | ICD-10-CM | POA: Diagnosis not present

## 2020-02-23 DIAGNOSIS — I10 Essential (primary) hypertension: Secondary | ICD-10-CM | POA: Diagnosis not present

## 2020-02-23 DIAGNOSIS — E785 Hyperlipidemia, unspecified: Secondary | ICD-10-CM

## 2020-02-23 LAB — LIPID PANEL
Chol/HDL Ratio: 3.2 ratio (ref 0.0–5.0)
Cholesterol, Total: 167 mg/dL (ref 100–199)
HDL: 53 mg/dL (ref >39)
LDL Chol Calc (NIH): 99 mg/dL (ref 0–99)
Triglycerides: 82 mg/dL (ref 0–149)
VLDL Cholesterol Cal: 15 mg/dL (ref 5–40)

## 2020-02-29 ENCOUNTER — Encounter: Payer: Self-pay | Admitting: Acute Care

## 2020-03-08 ENCOUNTER — Encounter: Payer: Self-pay | Admitting: *Deleted

## 2021-02-01 ENCOUNTER — Encounter: Payer: Self-pay | Admitting: Internal Medicine

## 2021-02-01 ENCOUNTER — Emergency Department (HOSPITAL_COMMUNITY): Payer: Medicare HMO

## 2021-02-01 ENCOUNTER — Encounter (HOSPITAL_COMMUNITY): Payer: Self-pay | Admitting: Emergency Medicine

## 2021-02-01 ENCOUNTER — Ambulatory Visit (INDEPENDENT_AMBULATORY_CARE_PROVIDER_SITE_OTHER): Payer: Medicare HMO | Admitting: Internal Medicine

## 2021-02-01 ENCOUNTER — Other Ambulatory Visit: Payer: Self-pay

## 2021-02-01 ENCOUNTER — Observation Stay (HOSPITAL_COMMUNITY)
Admission: EM | Admit: 2021-02-01 | Discharge: 2021-02-04 | Disposition: A | Discharge status: Home / Self Care | Payer: Medicare HMO | Attending: Emergency Medicine | Admitting: Emergency Medicine

## 2021-02-01 ENCOUNTER — Telehealth: Payer: Self-pay

## 2021-02-01 VITALS — BP 148/100 | HR 77 | Temp 98.6°F | Resp 16 | Ht 70.0 in | Wt 260.0 lb

## 2021-02-01 DIAGNOSIS — R69 Illness, unspecified: Secondary | ICD-10-CM | POA: Diagnosis not present

## 2021-02-01 DIAGNOSIS — R7303 Prediabetes: Secondary | ICD-10-CM

## 2021-02-01 DIAGNOSIS — F331 Major depressive disorder, recurrent, moderate: Secondary | ICD-10-CM | POA: Diagnosis not present

## 2021-02-01 DIAGNOSIS — Z720 Tobacco use: Secondary | ICD-10-CM

## 2021-02-01 DIAGNOSIS — N401 Enlarged prostate with lower urinary tract symptoms: Secondary | ICD-10-CM

## 2021-02-01 DIAGNOSIS — R778 Other specified abnormalities of plasma proteins: Secondary | ICD-10-CM | POA: Diagnosis present

## 2021-02-01 DIAGNOSIS — R0602 Shortness of breath: Secondary | ICD-10-CM | POA: Diagnosis not present

## 2021-02-01 DIAGNOSIS — I2510 Atherosclerotic heart disease of native coronary artery without angina pectoris: Secondary | ICD-10-CM | POA: Diagnosis present

## 2021-02-01 DIAGNOSIS — E519 Thiamine deficiency, unspecified: Secondary | ICD-10-CM | POA: Diagnosis not present

## 2021-02-01 DIAGNOSIS — E876 Hypokalemia: Secondary | ICD-10-CM

## 2021-02-01 DIAGNOSIS — E538 Deficiency of other specified B group vitamins: Secondary | ICD-10-CM

## 2021-02-01 DIAGNOSIS — Z7982 Long term (current) use of aspirin: Secondary | ICD-10-CM | POA: Insufficient documentation

## 2021-02-01 DIAGNOSIS — N182 Chronic kidney disease, stage 2 (mild): Secondary | ICD-10-CM | POA: Insufficient documentation

## 2021-02-01 DIAGNOSIS — R7989 Other specified abnormal findings of blood chemistry: Secondary | ICD-10-CM | POA: Diagnosis present

## 2021-02-01 DIAGNOSIS — R351 Nocturia: Secondary | ICD-10-CM

## 2021-02-01 DIAGNOSIS — Z23 Encounter for immunization: Secondary | ICD-10-CM

## 2021-02-01 DIAGNOSIS — I10 Essential (primary) hypertension: Secondary | ICD-10-CM | POA: Diagnosis not present

## 2021-02-01 DIAGNOSIS — G4733 Obstructive sleep apnea (adult) (pediatric): Secondary | ICD-10-CM

## 2021-02-01 DIAGNOSIS — I129 Hypertensive chronic kidney disease with stage 1 through stage 4 chronic kidney disease, or unspecified chronic kidney disease: Secondary | ICD-10-CM | POA: Diagnosis not present

## 2021-02-01 DIAGNOSIS — R0609 Other forms of dyspnea: Principal | ICD-10-CM

## 2021-02-01 DIAGNOSIS — N183 Chronic kidney disease, stage 3 unspecified: Secondary | ICD-10-CM | POA: Diagnosis present

## 2021-02-01 DIAGNOSIS — E559 Vitamin D deficiency, unspecified: Secondary | ICD-10-CM

## 2021-02-01 DIAGNOSIS — E785 Hyperlipidemia, unspecified: Secondary | ICD-10-CM | POA: Diagnosis present

## 2021-02-01 DIAGNOSIS — D75839 Thrombocytosis, unspecified: Secondary | ICD-10-CM | POA: Diagnosis not present

## 2021-02-01 DIAGNOSIS — Z20822 Contact with and (suspected) exposure to covid-19: Secondary | ICD-10-CM | POA: Diagnosis not present

## 2021-02-01 DIAGNOSIS — F17210 Nicotine dependence, cigarettes, uncomplicated: Secondary | ICD-10-CM | POA: Insufficient documentation

## 2021-02-01 DIAGNOSIS — Z79899 Other long term (current) drug therapy: Secondary | ICD-10-CM | POA: Diagnosis not present

## 2021-02-01 DIAGNOSIS — Z0001 Encounter for general adult medical examination with abnormal findings: Secondary | ICD-10-CM | POA: Diagnosis not present

## 2021-02-01 DIAGNOSIS — J449 Chronic obstructive pulmonary disease, unspecified: Secondary | ICD-10-CM | POA: Insufficient documentation

## 2021-02-01 DIAGNOSIS — M67431 Ganglion, right wrist: Secondary | ICD-10-CM

## 2021-02-01 DIAGNOSIS — N189 Chronic kidney disease, unspecified: Secondary | ICD-10-CM | POA: Diagnosis present

## 2021-02-01 DIAGNOSIS — R0600 Dyspnea, unspecified: Secondary | ICD-10-CM | POA: Diagnosis not present

## 2021-02-01 DIAGNOSIS — N1832 Chronic kidney disease, stage 3b: Secondary | ICD-10-CM | POA: Diagnosis present

## 2021-02-01 LAB — CBC
HCT: 44.5 % (ref 39.0–52.0)
Hemoglobin: 14.5 g/dL (ref 13.0–17.0)
MCH: 31.2 pg (ref 26.0–34.0)
MCHC: 32.6 g/dL (ref 30.0–36.0)
MCV: 95.7 fL (ref 80.0–100.0)
Platelets: 392 10*3/uL (ref 150–400)
RBC: 4.65 MIL/uL (ref 4.22–5.81)
RDW: 14.8 % (ref 11.5–15.5)
WBC: 6.3 10*3/uL (ref 4.0–10.5)
nRBC: 0 % (ref 0.0–0.2)

## 2021-02-01 LAB — CBC WITH DIFFERENTIAL/PLATELET
Basophils Absolute: 0 10*3/uL (ref 0.0–0.1)
Basophils Relative: 0.6 % (ref 0.0–3.0)
Eosinophils Absolute: 0.1 10*3/uL (ref 0.0–0.7)
Eosinophils Relative: 1.8 % (ref 0.0–5.0)
HCT: 44.5 % (ref 39.0–52.0)
Hemoglobin: 14.6 g/dL (ref 13.0–17.0)
Lymphocytes Relative: 39.6 % (ref 12.0–46.0)
Lymphs Abs: 2.2 10*3/uL (ref 0.7–4.0)
MCHC: 32.8 g/dL (ref 30.0–36.0)
MCV: 96.3 fl (ref 78.0–100.0)
Monocytes Absolute: 0.4 10*3/uL (ref 0.1–1.0)
Monocytes Relative: 6.5 % (ref 3.0–12.0)
Neutro Abs: 2.8 10*3/uL (ref 1.4–7.7)
Neutrophils Relative %: 51.5 % (ref 43.0–77.0)
Platelets: 364 10*3/uL (ref 150.0–400.0)
RBC: 4.63 Mil/uL (ref 4.22–5.81)
RDW: 15.2 % (ref 11.5–15.5)
WBC: 5.5 10*3/uL (ref 4.0–10.5)

## 2021-02-01 LAB — LIPID PANEL
Cholesterol: 217 mg/dL — ABNORMAL HIGH (ref 0–200)
HDL: 51.9 mg/dL (ref >39.00)
LDL Cholesterol: 140 mg/dL — ABNORMAL HIGH (ref 0–99)
NonHDL: 165.25
Total CHOL/HDL Ratio: 4
Triglycerides: 124 mg/dL (ref 0.0–149.0)
VLDL: 24.8 mg/dL (ref 0.0–40.0)

## 2021-02-01 LAB — URINALYSIS, ROUTINE W REFLEX MICROSCOPIC
Bilirubin Urine: NEGATIVE
Ketones, ur: NEGATIVE
Leukocytes,Ua: NEGATIVE
Nitrite: NEGATIVE
RBC / HPF: NONE SEEN (ref 0–?)
Specific Gravity, Urine: 1.025 (ref 1.000–1.030)
Total Protein, Urine: NEGATIVE
Urine Glucose: NEGATIVE
Urobilinogen, UA: 0.2 (ref 0.0–1.0)
pH: 6 (ref 5.0–8.0)

## 2021-02-01 LAB — BASIC METABOLIC PANEL
Anion gap: 7 (ref 5–15)
BUN: 17 mg/dL (ref 6–23)
BUN: 18 mg/dL (ref 8–23)
CO2: 30 mEq/L (ref 19–32)
CO2: 27 mmol/L (ref 22–32)
Calcium: 9.3 mg/dL (ref 8.4–10.5)
Calcium: 9.1 mg/dL (ref 8.9–10.3)
Chloride: 107 mEq/L (ref 96–112)
Chloride: 108 mmol/L (ref 98–111)
Creatinine, Ser: 1.64 mg/dL — ABNORMAL HIGH (ref 0.40–1.50)
Creatinine, Ser: 1.67 mg/dL — ABNORMAL HIGH (ref 0.61–1.24)
GFR, Estimated: 44 mL/min — ABNORMAL LOW (ref >60)
GFR: 42.2 mL/min — ABNORMAL LOW (ref >60.00)
Glucose, Bld: 99 mg/dL (ref 70–99)
Glucose, Bld: 144 mg/dL — ABNORMAL HIGH (ref 70–99)
Potassium: 3.8 mEq/L (ref 3.5–5.1)
Potassium: 3.2 mmol/L — ABNORMAL LOW (ref 3.5–5.1)
Sodium: 145 mEq/L (ref 135–145)
Sodium: 142 mmol/L (ref 135–145)

## 2021-02-01 LAB — TSH: TSH: 5.23 u[IU]/mL (ref 0.35–5.50)

## 2021-02-01 LAB — TROPONIN I (HIGH SENSITIVITY)
High Sens Troponin I: 76 ng/L (ref 2–17)
Troponin I (High Sensitivity): 75 ng/L — ABNORMAL HIGH (ref <18)
Troponin I (High Sensitivity): 72 ng/L — ABNORMAL HIGH (ref <18)

## 2021-02-01 LAB — PSA: PSA: 0.75 ng/mL (ref 0.10–4.00)

## 2021-02-01 LAB — IBC + FERRITIN
Ferritin: 22.7 ng/mL (ref 22.0–322.0)
Iron: 134 ug/dL (ref 42–165)
Saturation Ratios: 33.7 % (ref 20.0–50.0)
TIBC: 397.6 ug/dL (ref 250.0–450.0)
Transferrin: 284 mg/dL (ref 212.0–360.0)

## 2021-02-01 LAB — FOLATE: Folate: 8.6 ng/mL (ref >5.9)

## 2021-02-01 LAB — BRAIN NATRIURETIC PEPTIDE: B Natriuretic Peptide: 61.8 pg/mL (ref 0.0–100.0)

## 2021-02-01 LAB — VITAMIN D 25 HYDROXY (VIT D DEFICIENCY, FRACTURES): VITD: 21.93 ng/mL — ABNORMAL LOW (ref 30.00–100.00)

## 2021-02-01 LAB — VITAMIN B12: Vitamin B-12: 220 pg/mL (ref 211–911)

## 2021-02-01 LAB — HEMOGLOBIN A1C: Hgb A1c MFr Bld: 6.2 % (ref 4.6–6.5)

## 2021-02-01 MED ORDER — AMLODIPINE BESYLATE 5 MG PO TABS: 5.0000 mg | ORAL_TABLET | Freq: Every day | ORAL | 0 refills | Status: DC

## 2021-02-01 MED ORDER — ATORVASTATIN CALCIUM 80 MG PO TABS: 80.0000 mg | ORAL_TABLET | Freq: Every day | ORAL | 0 refills | Status: DC

## 2021-02-01 MED ORDER — VITAMIN B-1 100 MG PO TABS: 100.0000 mg | ORAL_TABLET | Freq: Every day | ORAL | 1 refills | Status: DC

## 2021-02-01 MED ORDER — BUPROPION HCL ER (XL) 150 MG PO TB24: 150.0000 mg | ORAL_TABLET | Freq: Every day | ORAL | 0 refills | Status: DC

## 2021-02-01 MED ORDER — DULOXETINE HCL 60 MG PO CPEP: 60.0000 mg | ORAL_CAPSULE | Freq: Every day | ORAL | 1 refills | Status: DC

## 2021-02-01 MED ORDER — POTASSIUM CHLORIDE ER 20 MEQ PO TBCR: EXTENDED_RELEASE_TABLET | ORAL | 1 refills | Status: DC

## 2021-02-01 MED ORDER — IRBESARTAN 150 MG PO TABS: 150.0000 mg | ORAL_TABLET | Freq: Every day | ORAL | 0 refills | Status: DC

## 2021-02-01 NOTE — Telephone Encounter (Signed)
Spoke with pt and was able to inform him of hid critical lab per Dr. Ronnald Ramp and explained to the pt he needs to go to [facility] ED now. Pt states he understood and is on his way there now.

## 2021-02-01 NOTE — Telephone Encounter (Signed)
CRITICAL VALUE STICKER  CRITICAL VALUE: Troponin 76  RECEIVER (on-site recipient of call): Lovena Le A. Alroy Dust, CMA  DATE & TIME NOTIFIED: 02/01/21 4:55p  MESSENGER (representative from lab): [email or web address]  MD NOTIFIED:  yes   TIME OF NOTIFICATION: 4:55p  RESPONSE: waiting for response

## 2021-02-01 NOTE — Progress Notes (Signed)
Subjective:  Patient ID: James Sawyer, male    DOB: 1950-06-25  Age: 70 y.o. MRN: [id number]  CC: Annual Exam, Hypertension, and Diabetes  This visit occurred during the SARS-CoV-2 public health emergency.  Safety protocols were in place, including screening questions prior to the visit, additional usage of staff PPE, and extensive cleaning of exam room while observing appropriate contact time as indicated for disinfecting solutions.    HPI James Sawyer presents for a CPX and f/up -   He complains of a several week hx of DOE but denies CP.  Outpatient Medications Prior to Visit  Medication Sig Dispense Refill   aspirin 81 MG tablet Take 1 tablet (81 mg total) by mouth daily. 90 tablet 1   Cholecalciferol (OPTIMAL-D) 1.25 MG (50000 UT) capsule Take 1 capsule (50,000 Units total) by mouth once a week. 12 capsule 0   cyanocobalamin 2000 MCG tablet Take 1 tablet (2,000 mcg total) by mouth daily. 90 tablet 1   ezetimibe (ZETIA) 10 MG tablet Take 1 tablet (10 mg total) by mouth daily. 90 tablet 3   indapamide (LOZOL) 1.25 MG tablet Take 1 tablet (1.25 mg total) by mouth daily. 90 tablet 0   Omega-3 Fatty Acids (FISH OIL) 1000 MG CAPS Take by mouth.     amLODipine (NORVASC) 5 MG tablet Take 1 tablet (5 mg total) by mouth daily. 90 tablet 3   atorvastatin (LIPITOR) 80 MG tablet Take 1 tablet (80 mg total) by mouth daily. 90 tablet 3   buPROPion (WELLBUTRIN XL) 150 MG 24 hr tablet Take 1 tablet (150 mg total) by mouth daily. 90 tablet 0   DULoxetine (CYMBALTA) 60 MG capsule Take 1 capsule (60 mg total) by mouth daily. 90 capsule 1   irbesartan (AVAPRO) 150 MG tablet Take 1 tablet (150 mg total) by mouth daily. 90 tablet 0   lidocaine (XYLOCAINE) 5 % ointment Apply 1 application topically 3 (three) times daily as needed. 35.44 g 0   Potassium Chloride ER 20 MEQ TBCR Take 1 tablet by mouth BID 180 tablet 1   potassium chloride SA (KLOR-CON) 20 MEQ tablet Take 20 mEq by mouth 2 (two) times daily.      thiamine (VITAMIN B-1) 100 MG tablet Take 1 tablet (100 mg total) by mouth daily. 90 tablet 1   No facility-administered medications prior to visit.    ROS Review of Systems  Constitutional:  Positive for fatigue and unexpected weight change (wt gain). Negative for appetite change, chills and diaphoresis.  HENT: Negative.    Eyes: Negative.   Respiratory:  Positive for apnea and shortness of breath (DOE). Negative for cough, choking, chest tightness and wheezing.   Cardiovascular:  Negative for chest pain, palpitations and leg swelling.  Gastrointestinal:  Negative for abdominal pain, blood in stool, constipation, diarrhea, nausea and vomiting.  Endocrine: Negative.  Negative for cold intolerance, heat intolerance, polydipsia, polyphagia and polyuria.  Genitourinary: Negative.  Negative for difficulty urinating.  Musculoskeletal: Negative.  Negative for arthralgias and myalgias.  Skin: Negative.  Negative for color change and pallor.  Neurological: Negative.  Negative for dizziness, tremors, syncope, weakness, light-headedness and headaches.  Hematological:  Negative for adenopathy. Does not bruise/bleed easily.  Psychiatric/Behavioral:  Positive for confusion, dysphoric mood and sleep disturbance. Negative for agitation, behavioral problems, self-injury and suicidal ideas. The patient is not nervous/anxious.    Objective:  BP (!) 148/100 (BP Location: Left Arm, Patient Position: Sitting, Cuff Size: Large) Comment: BP (R) 148/100 (L) 148/98  Pulse 77   Temp 98.6 F (37 C) (Oral)   Resp 16   Ht 5\' 10"  (1.778 m)   Wt 260 lb (117.9 kg)   SpO2 94%   BMI 37.31 kg/m   BP Readings from Last 3 Encounters:  02/01/21 (!) 148/100  12/18/19 (!) 154/102  12/16/19 (!) 152/90    Wt Readings from Last 3 Encounters:  02/01/21 260 lb (117.9 kg)  12/18/19 250 lb 6.4 oz (113.6 kg)  12/16/19 254 lb (115.2 kg)    Physical Exam Vitals reviewed.  Constitutional:      Appearance: He is  obese. He is not ill-appearing.  HENT:     Nose: Nose normal.     Mouth/Throat:     Mouth: Mucous membranes are moist.  Eyes:     Conjunctiva/sclera: Conjunctivae normal.  Cardiovascular:     Rate and Rhythm: Normal rate and regular rhythm.     Heart sounds: Murmur heard.    No friction rub. No gallop.     Comments: EKG- SR with 1st degree AV block LAD LVH (more noticeable) with reciprocal changes No Q waves Pulmonary:     Effort: Pulmonary effort is normal.     Breath sounds: No stridor. No wheezing, rhonchi or rales.  Abdominal:     General: Abdomen is protuberant. Bowel sounds are normal. There is no distension.     Palpations: Abdomen is soft. There is no hepatomegaly, splenomegaly or mass.     Tenderness: There is no abdominal tenderness.     Hernia: No hernia is present. There is no hernia in the left inguinal area or right inguinal area.  Genitourinary:    Pubic Area: No rash.      Penis: Normal and circumcised.      Testes: Normal.     Epididymis:     Right: Normal.     Prostate: Enlarged. Not tender and no nodules present.     Rectum: Normal. Guaiac result negative. No mass, tenderness, anal fissure, external hemorrhoid or internal hemorrhoid. Normal anal tone.  Musculoskeletal:        General: Normal range of motion.       Arms:     Cervical back: Neck supple.     Right lower leg: No edema.     Left lower leg: No edema.  Lymphadenopathy:     Cervical: No cervical adenopathy.     Lower Body: No right inguinal adenopathy. No left inguinal adenopathy.  Skin:    General: Skin is warm and dry.  Neurological:     General: No focal deficit present.     Mental Status: He is alert.  Psychiatric:        Mood and Affect: Mood normal.        Behavior: Behavior normal.    Lab Results  Component Value Date   WBC 5.5 02/01/2021   HGB 14.6 02/01/2021   HCT 44.5 02/01/2021   PLT 364.0 02/01/2021   GLUCOSE 99 02/01/2021   CHOL 217 (H) 02/01/2021   TRIG 124.0  02/01/2021   HDL 51.90 02/01/2021   LDLDIRECT 145.0 08/26/2014   LDLCALC 140 (H) 02/01/2021   ALT 30 06/08/2019   AST 21 06/08/2019   NA 145 02/01/2021   K 3.8 02/01/2021   CL 107 02/01/2021   CREATININE 1.64 (H) 02/01/2021   BUN 17 02/01/2021   CO2 30 02/01/2021   TSH 5.23 02/01/2021   PSA 0.75 02/01/2021   INR 1.0 08/19/2018   HGBA1C 6.2 02/01/2021  CT Renal Stone Study  Result Date: 06/08/2019 CLINICAL DATA:  Right-sided flank pain EXAM: CT ABDOMEN AND PELVIS WITHOUT CONTRAST TECHNIQUE: Multidetector CT imaging of the abdomen and pelvis was performed following the standard protocol without IV contrast. COMPARISON:  04/25/2014 FINDINGS: Lower chest: Lung bases demonstrate no acute consolidation or pleural effusion. Heart size within normal limits. Coronary vascular calcification. Hepatobiliary: Stable subcentimeter hypodensity at the dome of the right lobe. Small cyst in the inferior right hepatic lobe. Calcified gallstones. No biliary dilatation. Pancreas: Unremarkable. No pancreatic ductal dilatation or surrounding inflammatory changes. Spleen: Normal in size without focal abnormality. Adrenals/Urinary Tract: Adrenal glands are normal. No hydronephrosis or ureteral stone. Intrarenal vascular calcifications. Punctate stone lower pole left kidney. Multiple stones in the right kidney, the largest is seen in the lower pole and measures 4 mm. Bladder is thick walled and nearly empty. Stomach/Bowel: Stomach is within normal limits. Appendix appears normal. No evidence of bowel wall thickening, distention, or inflammatory changes. Vascular/Lymphatic: Moderate aortic atherosclerosis. No aneurysm. No significant adenopathy Reproductive: Enlarged prostate. Other: Negative for free air or free fluid. Musculoskeletal: No acute or significant osseous findings. IMPRESSION: 1. Intrarenal stones but negative for hydronephrosis or ureteral stone. 2. Gallstones Electronically Signed   By: Donavan Foil M.D.    On: 06/08/2019 23:36    Assessment & Plan:   Kino was seen today for annual exam, hypertension and diabetes.  Diagnoses and all orders for this visit:  Stage 2 chronic kidney disease- His renal function has declined.  Will try to get better control of his blood pressure. -     CBC with Differential/Platelet; Future -     CBC with Differential/Platelet  Moderate episode of recurrent major depressive disorder (HCC) -     buPROPion (WELLBUTRIN XL) 150 MG 24 hr tablet; Take 1 tablet (150 mg total) by mouth daily. -     DULoxetine (CYMBALTA) 60 MG capsule; Take 1 capsule (60 mg total) by mouth daily. -     TSH; Future -     TSH  Essential hypertension- His BP is not at goal.  Will restart irbesartan and amlodipine. -     irbesartan (AVAPRO) 150 MG tablet; Take 1 tablet (150 mg total) by mouth daily. -     Potassium Chloride ER 20 MEQ TBCR; Take 1 tablet by mouth BID -     CBC with Differential/Platelet; Future -     TSH; Future -     Urinalysis, Routine w reflex microscopic; Future -     EKG 12-Lead -     amLODipine (NORVASC) 5 MG tablet; Take 1 tablet (5 mg total) by mouth daily. -     Urinalysis, Routine w reflex microscopic -     TSH -     CBC with Differential/Platelet  Coronary artery disease involving native coronary artery of native heart without angina pectoris- He complains of DOE and has an elevated troponin.  I have asked him to go to the ED tonight to have his troponin level rechecked. -     irbesartan (AVAPRO) 150 MG tablet; Take 1 tablet (150 mg total) by mouth daily. -     Pro b natriuretic peptide (BNP); Future -     Troponin I (High Sensitivity); Future -     amLODipine (NORVASC) 5 MG tablet; Take 1 tablet (5 mg total) by mouth daily. -     atorvastatin (LIPITOR) 80 MG tablet; Take 1 tablet (80 mg total) by mouth daily. -  Troponin I (High Sensitivity) -     Pro b natriuretic peptide (BNP)  Hypokalemia -     Potassium Chloride ER 20 MEQ TBCR; Take 1 tablet by  mouth BID -     Basic metabolic panel; Future -     Basic metabolic panel  Thiamine deficiency -     thiamine (VITAMIN B-1) 100 MG tablet; Take 1 tablet (100 mg total) by mouth daily. -     CBC with Differential/Platelet; Future -     Urinalysis, Routine w reflex microscopic; Future -     Urinalysis, Routine w reflex microscopic -     CBC with Differential/Platelet  Thrombocytosis- His PLT count is normal now. -     CBC with Differential/Platelet; Future -     IBC + Ferritin; Future -     IBC + Ferritin -     CBC with Differential/Platelet  B12 deficiency- B12 and folate are normal. -     CBC with Differential/Platelet; Future -     Vitamin B12; Future -     Folate; Future -     Folate -     Vitamin B12 -     CBC with Differential/Platelet  Vitamin D deficiency disease -     VITAMIN D 25 Hydroxy (Vit-D Deficiency, Fractures); Future -     VITAMIN D 25 Hydroxy (Vit-D Deficiency, Fractures)  Hyperlipidemia with target LDL less than 100- Will restart the statin. -     TSH; Future -     Lipid panel; Future -     atorvastatin (LIPITOR) 80 MG tablet; Take 1 tablet (80 mg total) by mouth daily. -     Lipid panel -     TSH  Prediabetes -     Hemoglobin A1c; Future -     Hemoglobin A1c  BPH associated with nocturia -     PSA; Future -     Urinalysis, Routine w reflex microscopic; Future -     Urinalysis, Routine w reflex microscopic -     PSA  OSA (obstructive sleep apnea) -     Ambulatory referral to Sleep Studies  Flu vaccine need -     Flu Vaccine QUAD High Dose(Fluad)  Tobacco abuse -     Ambulatory Referral for Lung Cancer Scre  Ganglion cyst of wrist, right -     Ambulatory referral to Orthopedic Surgery  I have discontinued James Sawyer's lidocaine and potassium chloride SA. I am also having him maintain his aspirin, indapamide, Fish Oil, cyanocobalamin, Optimal-D, ezetimibe, buPROPion, DULoxetine, irbesartan, Potassium Chloride ER, thiamine, amLODipine, and  atorvastatin.  Meds ordered this encounter  Medications   buPROPion (WELLBUTRIN XL) 150 MG 24 hr tablet    Sig: Take 1 tablet (150 mg total) by mouth daily.    Dispense:  90 tablet    Refill:  0   DULoxetine (CYMBALTA) 60 MG capsule    Sig: Take 1 capsule (60 mg total) by mouth daily.    Dispense:  90 capsule    Refill:  1   irbesartan (AVAPRO) 150 MG tablet    Sig: Take 1 tablet (150 mg total) by mouth daily.    Dispense:  90 tablet    Refill:  0   Potassium Chloride ER 20 MEQ TBCR    Sig: Take 1 tablet by mouth BID    Dispense:  180 tablet    Refill:  1   thiamine (VITAMIN B-1) 100 MG tablet    Sig:  Take 1 tablet (100 mg total) by mouth daily.    Dispense:  90 tablet    Refill:  1   amLODipine (NORVASC) 5 MG tablet    Sig: Take 1 tablet (5 mg total) by mouth daily.    Dispense:  90 tablet    Refill:  0   atorvastatin (LIPITOR) 80 MG tablet    Sig: Take 1 tablet (80 mg total) by mouth daily.    Dispense:  90 tablet    Refill:  0      Follow-up: Return in about 3 months (around 05/04/2021).  Scarlette Calico, MD

## 2021-02-01 NOTE — ED Provider Notes (Signed)
Emergency Medicine Provider Triage Evaluation Note  James Sawyer , a 70 y.o. male  was evaluated in triage.  Patient to to the emergency department from primary care provider with an elevated troponin value of 76.  Patient denies chest pain, but reports constant shortness of breath and fatigue going on for a while.  He had cardiac stents placed 5 years ago, and states that his shortness of breath has gotten worse ever since then.  No acute change.  Review of Systems  Positive: Shortness of breath, fatigue Negative: Chest pain, abdominal pain, nausea, vomiting, fevers, chills  Physical Exam  BP (!) 173/96 (BP Location: Right Arm)   Pulse 93   Temp 98.6 F (37 C) (Oral)   Resp 18   SpO2 94%  Gen:   Awake, no distress   Resp:  Normal effort  MSK:   Moves extremities without difficulty  Other:  Lung sounds clear to auscultation  Medical Decision Making  Medically screening exam initiated at 6:42 PM.  Appropriate orders placed.  James Sawyer was informed that the remainder of the evaluation will be completed by another provider, this initial triage assessment does not replace that evaluation, and the importance of remaining in the ED until their evaluation is complete.  Plan to repeat troponin value and evaluate for ACS   Estill Cotta 02/01/21 1844    Drenda Freeze, MD 02/01/21 2228

## 2021-02-01 NOTE — Patient Instructions (Signed)
Health Maintenance, Male Adopting a healthy lifestyle and getting preventive care are important in promoting health and wellness. Ask your health care provider about: The right schedule for you to have regular tests and exams. Things you can do on your own to prevent diseases and keep yourself healthy. What should I know about diet, weight, and exercise? Eat a healthy diet  Eat a diet that includes plenty of vegetables, fruits, low-fat dairy products, and lean protein. Do not eat a lot of foods that are high in solid fats, added sugars, or sodium. Maintain a healthy weight Body mass index (BMI) is a measurement that can be used to identify possible weight problems. It estimates body fat based on height and weight. Your health care provider can help determine your BMI and help you achieve or maintain a healthy weight. Get regular exercise Get regular exercise. This is one of the most important things you can do for your health. Most adults should: Exercise for at least 150 minutes each week. The exercise should increase your heart rate and make you sweat (moderate-intensity exercise). Do strengthening exercises at least twice a week. This is in addition to the moderate-intensity exercise. Spend less time sitting. Even light physical activity can be beneficial. Watch cholesterol and blood lipids Have your blood tested for lipids and cholesterol at 70 years of age, then have this test every 5 years. You may need to have your cholesterol levels checked more often if: Your lipid or cholesterol levels are high. You are older than 70 years of age. You are at high risk for heart disease. What should I know about cancer screening? Many types of cancers can be detected early and may often be prevented. Depending on your health history and family history, you may need to have cancer screening at various ages. This may include screening for: Colorectal cancer. Prostate cancer. Skin cancer. Lung  cancer. What should I know about heart disease, diabetes, and high blood pressure? Blood pressure and heart disease High blood pressure causes heart disease and increases the risk of stroke. This is more likely to develop in people who have high blood pressure readings, are of African descent, or are overweight. Talk with your health care provider about your target blood pressure readings. Have your blood pressure checked: Every 3-5 years if you are 18-39 years of age. Every year if you are 40 years old or older. If you are between the ages of 65 and 75 and are a current or former smoker, ask your health care provider if you should have a one-time screening for abdominal aortic aneurysm (AAA). Diabetes Have regular diabetes screenings. This checks your fasting blood sugar level. Have the screening done: Once every three years after age 45 if you are at a normal weight and have a low risk for diabetes. More often and at a younger age if you are overweight or have a high risk for diabetes. What should I know about preventing infection? Hepatitis B If you have a higher risk for hepatitis B, you should be screened for this virus. Talk with your health care provider to find out if you are at risk for hepatitis B infection. Hepatitis C Blood testing is recommended for: Everyone born from 1945 through 1965. Anyone with known risk factors for hepatitis C. Sexually transmitted infections (STIs) You should be screened each year for STIs, including gonorrhea and chlamydia, if: You are sexually active and are younger than 70 years of age. You are older than 70 years   of age and your health care provider tells you that you are at risk for this type of infection. Your sexual activity has changed since you were last screened, and you are at increased risk for chlamydia or gonorrhea. Ask your health care provider if you are at risk. Ask your health care provider about whether you are at high risk for HIV.  Your health care provider may recommend a prescription medicine to help prevent HIV infection. If you choose to take medicine to prevent HIV, you should first get tested for HIV. You should then be tested every 3 months for as long as you are taking the medicine. Follow these instructions at home: Lifestyle Do not use any products that contain nicotine or tobacco, such as cigarettes, e-cigarettes, and chewing tobacco. If you need help quitting, ask your health care provider. Do not use street drugs. Do not share needles. Ask your health care provider for help if you need support or information about quitting drugs. Alcohol use Do not drink alcohol if your health care provider tells you not to drink. If you drink alcohol: Limit how much you have to 0-2 drinks a day. Be aware of how much alcohol is in your drink. In the [place] one drink equals one 12 oz bottle of beer (355 mL), one 5 oz glass of wine (148 mL), or one 1 oz glass of hard liquor (44 mL). General instructions Schedule regular health, dental, and eye exams. Stay current with your vaccines. Tell your health care provider if: You often feel depressed. You have ever been abused or do not feel safe at home. Summary Adopting a healthy lifestyle and getting preventive care are important in promoting health and wellness. Follow your health care provider's instructions about healthy diet, exercising, and getting tested or screened for diseases. Follow your health care provider's instructions on monitoring your cholesterol and blood pressure. This information is not intended to replace advice given to you by your health care provider. Make sure you discuss any questions you have with your health care provider. Document Revised: 06/03/2020 Document Reviewed: 03/19/2018 Elsevier Patient Education  2022 [employer]  

## 2021-02-01 NOTE — ED Triage Notes (Signed)
Patient sent to [facility] from PCP for further evaluation of troponin of 76. Patient denies chest pain, reports constant shortness of breath and fatigue since previous stent five years ago.

## 2021-02-02 ENCOUNTER — Encounter (HOSPITAL_COMMUNITY): Payer: Self-pay | Admitting: Internal Medicine

## 2021-02-02 ENCOUNTER — Observation Stay (HOSPITAL_COMMUNITY): Payer: Medicare HMO

## 2021-02-02 DIAGNOSIS — N183 Chronic kidney disease, stage 3 unspecified: Secondary | ICD-10-CM

## 2021-02-02 DIAGNOSIS — R9431 Abnormal electrocardiogram [ECG] [EKG]: Secondary | ICD-10-CM | POA: Diagnosis not present

## 2021-02-02 DIAGNOSIS — Z9114 Patient's other noncompliance with medication regimen: Secondary | ICD-10-CM | POA: Diagnosis not present

## 2021-02-02 DIAGNOSIS — I10 Essential (primary) hypertension: Secondary | ICD-10-CM | POA: Diagnosis not present

## 2021-02-02 DIAGNOSIS — E785 Hyperlipidemia, unspecified: Secondary | ICD-10-CM | POA: Diagnosis not present

## 2021-02-02 DIAGNOSIS — N182 Chronic kidney disease, stage 2 (mild): Secondary | ICD-10-CM

## 2021-02-02 DIAGNOSIS — R778 Other specified abnormalities of plasma proteins: Secondary | ICD-10-CM | POA: Diagnosis not present

## 2021-02-02 DIAGNOSIS — R0609 Other forms of dyspnea: Secondary | ICD-10-CM | POA: Diagnosis not present

## 2021-02-02 DIAGNOSIS — I25110 Atherosclerotic heart disease of native coronary artery with unstable angina pectoris: Secondary | ICD-10-CM | POA: Diagnosis not present

## 2021-02-02 DIAGNOSIS — I2510 Atherosclerotic heart disease of native coronary artery without angina pectoris: Secondary | ICD-10-CM | POA: Diagnosis not present

## 2021-02-02 DIAGNOSIS — I340 Nonrheumatic mitral (valve) insufficiency: Secondary | ICD-10-CM | POA: Diagnosis not present

## 2021-02-02 LAB — ECHOCARDIOGRAM COMPLETE
Area-P 1/2: 3.68 cm2
Calc EF: 44.5 %
Height: 70 in
S' Lateral: 3.6 cm
Single Plane A2C EF: 45.6 %
Single Plane A4C EF: 42.4 %
Weight: 4160 oz

## 2021-02-02 LAB — RESP PANEL BY RT-PCR (FLU A&B, COVID) ARPGX2
Influenza A by PCR: NEGATIVE
Influenza B by PCR: NEGATIVE
SARS Coronavirus 2 by RT PCR: NEGATIVE

## 2021-02-02 LAB — TROPONIN I (HIGH SENSITIVITY)
Troponin I (High Sensitivity): 77 ng/L — ABNORMAL HIGH (ref <18)
Troponin I (High Sensitivity): 70 ng/L — ABNORMAL HIGH (ref <18)

## 2021-02-02 MED ORDER — IRBESARTAN 150 MG PO TABS
150.0000 mg | ORAL_TABLET | Freq: Every day | ORAL | Status: DC
  Filled 2021-02-02: qty 1

## 2021-02-02 MED ORDER — IRBESARTAN 150 MG PO TABS
150.0000 mg | ORAL_TABLET | Freq: Every day | ORAL | Status: DC
  Administered 2021-02-02 – 2021-02-04 (×3): 150 mg via ORAL
  Filled 2021-02-02 (×4): qty 1

## 2021-02-02 MED ORDER — BUPROPION HCL ER (XL) 150 MG PO TB24
150.0000 mg | ORAL_TABLET | Freq: Every day | ORAL | Status: DC
  Administered 2021-02-02 – 2021-02-04 (×3): 150 mg via ORAL
  Filled 2021-02-02 (×4): qty 1

## 2021-02-02 MED ORDER — LIDOCAINE VISCOUS HCL 2 % MT SOLN
15.0000 mL | Freq: Once | OROMUCOSAL | Status: AC
  Administered 2021-02-02: 15 mL via ORAL
  Filled 2021-02-02: qty 15

## 2021-02-02 MED ORDER — EZETIMIBE 10 MG PO TABS
10.0000 mg | ORAL_TABLET | Freq: Every day | ORAL | Status: DC
  Administered 2021-02-02 – 2021-02-04 (×3): 10 mg via ORAL
  Filled 2021-02-02 (×3): qty 1

## 2021-02-02 MED ORDER — THIAMINE HCL 100 MG PO TABS
100.0000 mg | ORAL_TABLET | Freq: Every day | ORAL | Status: DC
  Administered 2021-02-02 – 2021-02-04 (×3): 100 mg via ORAL
  Filled 2021-02-02 (×3): qty 1

## 2021-02-02 MED ORDER — ATORVASTATIN CALCIUM 80 MG PO TABS
80.0000 mg | ORAL_TABLET | Freq: Every day | ORAL | Status: DC
  Administered 2021-02-02 – 2021-02-04 (×3): 80 mg via ORAL
  Filled 2021-02-02 (×2): qty 1
  Filled 2021-02-02: qty 2

## 2021-02-02 MED ORDER — INDAPAMIDE 1.25 MG PO TABS
1.2500 mg | ORAL_TABLET | Freq: Every day | ORAL | Status: DC
  Administered 2021-02-02 – 2021-02-04 (×3): 1.25 mg via ORAL
  Filled 2021-02-02 (×3): qty 1

## 2021-02-02 MED ORDER — AMLODIPINE BESYLATE 5 MG PO TABS
5.0000 mg | ORAL_TABLET | Freq: Once | ORAL | Status: AC
  Administered 2021-02-02: 5 mg via ORAL
  Filled 2021-02-02: qty 1

## 2021-02-02 MED ORDER — ALUM & MAG HYDROXIDE-SIMETH 200-200-20 MG/5ML PO SUSP
30.0000 mL | Freq: Once | ORAL | Status: AC
  Administered 2021-02-02: 30 mL via ORAL
  Filled 2021-02-02: qty 30

## 2021-02-02 MED ORDER — ENOXAPARIN SODIUM 40 MG/0.4ML IJ SOSY: 40.0000 mg | PREFILLED_SYRINGE | INTRAMUSCULAR | Status: DC

## 2021-02-02 MED ORDER — POTASSIUM CHLORIDE 20 MEQ PO PACK: 20.0000 meq | PACK | Freq: Every day | ORAL | Status: DC

## 2021-02-02 MED ORDER — PERFLUTREN LIPID MICROSPHERE
1.0000 mL | INTRAVENOUS | Status: AC | PRN
  Administered 2021-02-02: 2 mL via INTRAVENOUS
  Filled 2021-02-02: qty 10

## 2021-02-02 MED ORDER — AMLODIPINE BESYLATE 5 MG PO TABS
5.0000 mg | ORAL_TABLET | Freq: Every day | ORAL | Status: DC
  Administered 2021-02-02 – 2021-02-03 (×2): 5 mg via ORAL
  Filled 2021-02-02 (×3): qty 1

## 2021-02-02 MED ORDER — ONDANSETRON HCL 4 MG/2ML IJ SOLN: 4.0000 mg | Freq: Four times a day (QID) | INTRAMUSCULAR | Status: DC | PRN

## 2021-02-02 MED ORDER — ZOLPIDEM TARTRATE 5 MG PO TABS
5.0000 mg | ORAL_TABLET | Freq: Every evening | ORAL | Status: DC | PRN
  Administered 2021-02-02 – 2021-02-03 (×2): 5 mg via ORAL
  Filled 2021-02-02 (×2): qty 1

## 2021-02-02 MED ORDER — ASPIRIN 81 MG PO CHEW
81.0000 mg | CHEWABLE_TABLET | Freq: Every day | ORAL | Status: DC
  Administered 2021-02-02 – 2021-02-04 (×3): 81 mg via ORAL
  Filled 2021-02-02 (×3): qty 1

## 2021-02-02 MED ORDER — HEPARIN SODIUM (PORCINE) 5000 UNIT/ML IJ SOLN
5000.0000 [IU] | Freq: Three times a day (TID) | INTRAMUSCULAR | Status: DC
  Administered 2021-02-02 – 2021-02-04 (×4): 5000 [IU] via SUBCUTANEOUS
  Filled 2021-02-02 (×5): qty 1

## 2021-02-02 MED ORDER — COVID-19MRNA BIVAL VACC PFIZER 30 MCG/0.3ML IM SUSP
0.3000 mL | Freq: Once | INTRAMUSCULAR | Status: AC
  Administered 2021-02-03: 0.3 mL via INTRAMUSCULAR
  Filled 2021-02-02: qty 0.3

## 2021-02-02 MED ORDER — METOPROLOL TARTRATE 25 MG PO TABS
25.0000 mg | ORAL_TABLET | Freq: Two times a day (BID) | ORAL | Status: DC
  Administered 2021-02-02 – 2021-02-03 (×3): 25 mg via ORAL
  Filled 2021-02-02 (×3): qty 1

## 2021-02-02 MED ORDER — ACETAMINOPHEN 325 MG PO TABS: 650.0000 mg | ORAL_TABLET | ORAL | Status: DC | PRN

## 2021-02-02 MED ORDER — POTASSIUM CHLORIDE 20 MEQ PO PACK
20.0000 meq | PACK | Freq: Two times a day (BID) | ORAL | Status: DC
  Administered 2021-02-02 – 2021-02-04 (×5): 20 meq via ORAL
  Filled 2021-02-02 (×5): qty 1

## 2021-02-02 MED ORDER — DULOXETINE HCL 60 MG PO CPEP
60.0000 mg | ORAL_CAPSULE | Freq: Every day | ORAL | Status: DC
  Administered 2021-02-02 – 2021-02-04 (×3): 60 mg via ORAL
  Filled 2021-02-02 (×3): qty 1
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
## 2021-02-02 NOTE — Progress Notes (Incomplete)
°  Echocardiogram 2D Echocardiogram has been performed.  Bobbye Charleston 02/02/2021, 12:01 PM

## 2021-02-02 NOTE — ED Notes (Signed)
Maintenance needing to come to room. Alarm going off in room
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
## 2021-02-02 NOTE — ED Notes (Signed)
Pt in bed, pt denies pain, pt states that he isn't sure if he really wants to stay in the hospital.  Pt calm and cooperative, cardiac monitor in place, pt appears in nsr
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
## 2021-02-02 NOTE — Plan of Care (Signed)
  Problem: Education: Goal: Knowledge of General Education information will improve Description: Including pain rating scale, medication(s)/side effects and non-pharmacologic comfort measures Outcome: Progressing   Problem: Health Behavior/Discharge Planning: Goal: Ability to manage health-related needs will improve Outcome: Progressing   Problem: Clinical Measurements: Goal: Ability to maintain clinical measurements within normal limits will improve Outcome: Progressing   Problem: Clinical Measurements: Goal: Cardiovascular complication will be avoided Outcome: Progressing   Problem: Clinical Measurements: Goal: Respiratory complications will improve Outcome: Progressing   Problem: Safety: Goal: Ability to remain free from injury will improve Outcome: Progressing

## 2021-02-03 ENCOUNTER — Other Ambulatory Visit: Payer: Self-pay | Admitting: Cardiology

## 2021-02-03 DIAGNOSIS — J449 Chronic obstructive pulmonary disease, unspecified: Secondary | ICD-10-CM | POA: Diagnosis not present

## 2021-02-03 DIAGNOSIS — Z20822 Contact with and (suspected) exposure to covid-19: Secondary | ICD-10-CM | POA: Diagnosis not present

## 2021-02-03 DIAGNOSIS — N183 Chronic kidney disease, stage 3 unspecified: Secondary | ICD-10-CM | POA: Diagnosis not present

## 2021-02-03 DIAGNOSIS — I2510 Atherosclerotic heart disease of native coronary artery without angina pectoris: Secondary | ICD-10-CM

## 2021-02-03 DIAGNOSIS — N182 Chronic kidney disease, stage 2 (mild): Secondary | ICD-10-CM | POA: Diagnosis not present

## 2021-02-03 DIAGNOSIS — Z9114 Patient's other noncompliance with medication regimen: Secondary | ICD-10-CM | POA: Diagnosis not present

## 2021-02-03 DIAGNOSIS — R9431 Abnormal electrocardiogram [ECG] [EKG]: Secondary | ICD-10-CM

## 2021-02-03 DIAGNOSIS — R778 Other specified abnormalities of plasma proteins: Secondary | ICD-10-CM | POA: Diagnosis not present

## 2021-02-03 DIAGNOSIS — Z79899 Other long term (current) drug therapy: Secondary | ICD-10-CM | POA: Diagnosis not present

## 2021-02-03 DIAGNOSIS — Z7982 Long term (current) use of aspirin: Secondary | ICD-10-CM | POA: Diagnosis not present

## 2021-02-03 DIAGNOSIS — R0609 Other forms of dyspnea: Secondary | ICD-10-CM | POA: Diagnosis not present

## 2021-02-03 DIAGNOSIS — G4730 Sleep apnea, unspecified: Secondary | ICD-10-CM

## 2021-02-03 DIAGNOSIS — I129 Hypertensive chronic kidney disease with stage 1 through stage 4 chronic kidney disease, or unspecified chronic kidney disease: Secondary | ICD-10-CM | POA: Diagnosis not present

## 2021-02-03 DIAGNOSIS — R69 Illness, unspecified: Secondary | ICD-10-CM | POA: Diagnosis not present

## 2021-02-03 LAB — CBC
HCT: 44.4 % (ref 39.0–52.0)
Hemoglobin: 14.5 g/dL (ref 13.0–17.0)
MCH: 31.5 pg (ref 26.0–34.0)
MCHC: 32.7 g/dL (ref 30.0–36.0)
MCV: 96.5 fL (ref 80.0–100.0)
Platelets: 400 10*3/uL (ref 150–400)
RBC: 4.6 MIL/uL (ref 4.22–5.81)
RDW: 14.6 % (ref 11.5–15.5)
WBC: 5.9 10*3/uL (ref 4.0–10.5)
nRBC: 0 % (ref 0.0–0.2)

## 2021-02-03 LAB — PRO B NATRIURETIC PEPTIDE: NT-Pro BNP: 284 pg/mL (ref 0–376)

## 2021-02-03 LAB — BASIC METABOLIC PANEL
Anion gap: 7 (ref 5–15)
BUN: 16 mg/dL (ref 8–23)
CO2: 28 mmol/L (ref 22–32)
Calcium: 8.7 mg/dL — ABNORMAL LOW (ref 8.9–10.3)
Chloride: 104 mmol/L (ref 98–111)
Creatinine, Ser: 1.51 mg/dL — ABNORMAL HIGH (ref 0.61–1.24)
GFR, Estimated: 49 mL/min — ABNORMAL LOW (ref >60)
Glucose, Bld: 106 mg/dL — ABNORMAL HIGH (ref 70–99)
Potassium: 3.6 mmol/L (ref 3.5–5.1)
Sodium: 139 mmol/L (ref 135–145)

## 2021-02-03 MED ORDER — METOPROLOL TARTRATE 12.5 MG HALF TABLET: 12.5000 mg | ORAL_TABLET | Freq: Two times a day (BID) | ORAL | Status: DC

## 2021-02-03 MED ORDER — NICOTINE 14 MG/24HR TD PT24
14.0000 mg | MEDICATED_PATCH | Freq: Every day | TRANSDERMAL | Status: DC
  Administered 2021-02-03 – 2021-02-04 (×2): 14 mg via TRANSDERMAL
  Filled 2021-02-03 (×2): qty 1

## 2021-02-03 NOTE — Progress Notes (Signed)
Progress Note  Patient Name: James Sawyer Date of Encounter: 02/03/2021  [facility] Cardiologist: Dorris Carnes, MD   Subjective   No significant SOB no chest pain  Inpatient Medications    Scheduled Meds:  amLODipine  5 mg Oral Daily   aspirin  81 mg Oral Daily   atorvastatin  80 mg Oral Daily   buPROPion  150 mg Oral Daily   COVID-19 mRNA bivalent vaccine (Pf[employer])  0.3 mL Intramuscular Once   DULoxetine  60 mg Oral Daily   ezetimibe  10 mg Oral Daily   heparin injection (subcutaneous)  5,000 Units Subcutaneous Q8H   indapamide  1.25 mg Oral Daily   irbesartan  150 mg Oral Daily   metoprolol tartrate  25 mg Oral BID   potassium chloride  20 mEq Oral BID   thiamine  100 mg Oral Daily   Continuous Infusions:  PRN Meds: acetaminophen, ondansetron (ZOFRAN) IV, zolpidem   Vital Signs    Vitals:   02/02/21 2315 02/03/21 0000 02/03/21 0506 02/03/21 0811  BP: (!) 147/118 (!) 145/74 (!) 164/102 (!) 144/96  Pulse: 62  63 (!) 55  Resp:   15   Temp: 98.2 F (36.8 C)  98.4 F (36.9 C) 98.5 F (36.9 C)  TempSrc: Oral  Oral Oral  SpO2: 92%  100% 94%  Weight:      Height:        Intake/Output Summary (Last 24 hours) at 02/03/2021 0853 Last data filed at 02/03/2021 0507 Gross per 24 hour  Intake 480 ml  Output 350 ml  Net 130 ml   Last 3 Weights 02/02/2021 02/01/2021 12/18/2019  Weight (lbs) 260 lb 11.2 oz 260 lb 250 lb 6.4 oz  Weight (kg) 118.253 kg 117.935 kg 113.581 kg      Telemetry    SR to SB with PACs, pt with brady most often at night with desat.  Due to sleep apnea - no atrial fib seen  Personally Reviewed  ECG    Abnormal SR with deep T wave inversions.more pronounced from yesterday  - Personally Reviewed  Physical Exam   GEN: No acute distress.   Neck: No JVD Cardiac: RRR, no murmurs, rubs, or gallops.  Respiratory: Clear to auscultation bilaterally. GI: Soft, nontender, non-distended  MS: No edema; No deformity. Neuro:  Nonfocal  Psych:  Normal affect   Labs    High Sensitivity Troponin:   Recent Labs  Lab 02/01/21 1843 02/01/21 2246 02/02/21 1422 02/02/21 1553  TROPONINIHS 75* 72* 77* 70*     Chemistry Recent Labs  Lab 02/01/21 1428 02/01/21 1843  NA 145 142  K 3.8 3.2*  CL 107 108  CO2 30 27  GLUCOSE 99 144*  BUN 17 18  CREATININE 1.64* 1.67*  CALCIUM 9.3 9.1  GFRNONAA  --  44*  ANIONGAP  --  7    Lipids  Recent Labs  Lab 02/01/21 1428  CHOL 217*  TRIG 124.0  HDL 51.90  LDLCALC 140*  CHOLHDL 4    Hematology Recent Labs  Lab 02/01/21 1428 02/01/21 1843  WBC 5.5 6.3  RBC 4.63 4.65  HGB 14.6 14.5  HCT 44.5 44.5  MCV 96.3 95.7  MCH  --  31.2  MCHC 32.8 32.6  RDW 15.2 14.8  PLT 364.0 392   Thyroid  Recent Labs  Lab 02/01/21 1428  TSH 5.23    BNP Recent Labs  Lab 02/01/21 1428 02/01/21 1845  BNP  --  61.8  PROBNP  284  --     DDimer No results for input(s): DDIMER in the last 168 hours.   Radiology    DG Chest 2 View  Result Date: 02/01/2021 CLINICAL DATA:  Shortness of breath EXAM: CHEST - 2 VIEW COMPARISON:  08/19/2018 FINDINGS: The heart size and mediastinal contours are within normal limits. Both lungs are clear. The visualized skeletal structures are unremarkable. Mild chronic elevation right diaphragm. IMPRESSION: No active cardiopulmonary disease. Electronically Signed   By: Donavan Foil M.D.   On: 02/01/2021 19:48   ECHOCARDIOGRAM COMPLETE  Result Date: 02/02/2021    ECHOCARDIOGRAM REPORT   Patient Name:   James Sawyer Date of Exam: 02/02/2021 Medical Rec #:  [id number]     Height:       70.0 in Accession #:    [id number]    Weight:       260.0 lb Date of Birth:  1950/09/15      BSA:          2.334 m Patient Age:    70 years      BP:           196/99 mmHg Patient Gender: M             HR:           59 bpm. Exam Location:  Inpatient Procedure: 2D Echo, Color Doppler, Cardiac Doppler and Intracardiac            Opacification Agent Indications:     I25.110  Atherosclerotic heart disease of native coronary artery                  with unstable angina pectoris  History:         Patient has prior history of Echocardiogram examinations, most                  recent 10/22/2014. CAD.  Sonographer:     Roseanna Rainbow RDCS Referring Phys:  [place] Diagnosing Phys: Dixie Dials MD IMPRESSIONS  1. Left ventricular ejection fraction, by estimation, is 55 to 60%. The left ventricle has normal function. The left ventricle has no regional wall motion abnormalities. There is moderate concentric left ventricular hypertrophy. Left ventricular diastolic parameters are consistent with Grade I diastolic dysfunction (impaired relaxation).  2. Right ventricular systolic function is normal. The right ventricular size is normal.  3. The mitral valve is normal in structure. Mild mitral valve regurgitation.  4. The aortic valve is tricuspid. There is moderate calcification of the aortic valve. There is mild thickening of the aortic valve. Aortic valve regurgitation is not visualized. Mild aortic valve sclerosis is present, with no evidence of aortic valve stenosis.  5. There is mild (Grade II) atheroma plaque involving the aortic root and ascending aorta.  6. The inferior vena cava is normal in size with <50% respiratory variability, suggesting right atrial pressure of 8 mmHg. FINDINGS  Left Ventricle: Left ventricular ejection fraction, by estimation, is 55 to 60%. The left ventricle has normal function. The left ventricle has no regional wall motion abnormalities. Definity contrast agent was given IV to delineate the left ventricular  endocardial borders. The left ventricular internal cavity size was normal in size. There is moderate concentric left ventricular hypertrophy. Left ventricular diastolic parameters are consistent with Grade I diastolic dysfunction (impaired relaxation). Right Ventricle: The right ventricular size is normal. No increase in right ventricular wall thickness.  Right ventricular systolic function is normal. Left Atrium: Left  atrial size was normal in size. Right Atrium: Right atrial size was not assessed. Pericardium: There is no evidence of pericardial effusion. Mitral Valve: The mitral valve is normal in structure. There is mild thickening of the mitral valve leaflet(s). There is mild calcification of the mitral valve leaflet(s). Mild mitral annular calcification. Mild mitral valve regurgitation. Tricuspid Valve: The tricuspid valve is normal in structure. Tricuspid valve regurgitation is trivial. Aortic Valve: The aortic valve is tricuspid. There is moderate calcification of the aortic valve. There is mild thickening of the aortic valve. There is mild aortic valve annular calcification. Aortic valve regurgitation is not visualized. Mild aortic valve sclerosis is present, with no evidence of aortic valve stenosis. Pulmonic Valve: The pulmonic valve was grossly normal. Pulmonic valve regurgitation is not visualized. Aorta: The aortic root is normal in size and structure. There is mild (Grade II) atheroma plaque involving the aortic root and ascending aorta. Venous: The inferior vena cava is normal in size with less than 50% respiratory variability, suggesting right atrial pressure of 8 mmHg. IAS/Shunts: The atrial septum is grossly normal.  LEFT VENTRICLE PLAX 2D LVIDd:         5.10 cm      Diastology LVIDs:         3.60 cm      LV e' medial:    3.83 cm/s LV PW:         1.60 cm      LV E/e' medial:  13.8 LV IVS:        1.60 cm      LV e' lateral:   5.68 cm/s LVOT diam:     2.20 cm      LV E/e' lateral: 9.3 LV SV:         73 LV SV Index:   31 LVOT Area:     3.80 cm                              3D Volume EF: LV Volumes (MOD)            3D EF:        40 % LV vol d, MOD A2C: 110.0 ml LV EDV:       186 ml LV vol d, MOD A4C: 76.4 ml  LV ESV:       112 ml LV vol s, MOD A2C: 59.8 ml  LV SV:        74 ml LV vol s, MOD A4C: 44.0 ml LV SV MOD A2C:     50.2 ml LV SV MOD A4C:      76.4 ml LV SV MOD BP:      42.8 ml RIGHT VENTRICLE RV S prime:     8.61 cm/s TAPSE (M-mode): 1.4 cm LEFT ATRIUM             Index        RIGHT ATRIUM           Index LA diam:        4.30 cm 1.84 cm/m   RA Area:     11.50 cm LA Vol (A2C):   61.3 ml 26.27 ml/m  RA Volume:   22.50 ml  9.64 ml/m LA Vol (A4C):   29.0 ml 12.43 ml/m LA Biplane Vol: 42.8 ml 18.34 ml/m  AORTIC VALVE LVOT Vmax:   113.00 cm/s LVOT Vmean:  65.100 cm/s LVOT VTI:    0.193 m  AORTA Ao Root diam: 3.70 cm Ao Asc diam:  3.80 cm MITRAL VALVE MV Area (PHT): 3.68 cm    SHUNTS MV Decel Time: 206 msec    Systemic VTI:  0.19 m MV E velocity: 52.70 cm/s  Systemic Diam: 2.20 cm MV A velocity: 67.10 cm/s MV E/A ratio:  0.79 Dixie Dials MD Electronically signed by Dixie Dials MD Signature Date/Time: 02/02/2021/12:48:56 PM    Final     Cardiac Studies   Echo 02/02/21 IMPRESSIONS     1. Left ventricular ejection fraction, by estimation, is 55 to 60%. The  left ventricle has normal function. The left ventricle has no regional  wall motion abnormalities. There is moderate concentric left ventricular  hypertrophy. Left ventricular  diastolic parameters are consistent with Grade I diastolic dysfunction  (impaired relaxation).   2. Right ventricular systolic function is normal. The right ventricular  size is normal.   3. The mitral valve is normal in structure. Mild mitral valve  regurgitation.   4. The aortic valve is tricuspid. There is moderate calcification of the  aortic valve. There is mild thickening of the aortic valve. Aortic valve  regurgitation is not visualized. Mild aortic valve sclerosis is present,  with no evidence of aortic valve  stenosis.   5. There is mild (Grade II) atheroma plaque involving the aortic root and  ascending aorta.   6. The inferior vena cava is normal in size with <50% respiratory  variability, suggesting right atrial pressure of 8 mmHg.   FINDINGS   Left Ventricle: Left ventricular ejection  fraction, by estimation, is 55  to 60%. The left ventricle has normal function. The left ventricle has no  regional wall motion abnormalities. Definity contrast agent was given IV  to delineate the left ventricular   endocardial borders. The left ventricular internal cavity size was normal  in size. There is moderate concentric left ventricular hypertrophy. Left  ventricular diastolic parameters are consistent with Grade I diastolic  dysfunction (impaired relaxation).   Right Ventricle: The right ventricular size is normal. No increase in  right ventricular wall thickness. Right ventricular systolic function is  normal.   Left Atrium: Left atrial size was normal in size.   Right Atrium: Right atrial size was not assessed.   Pericardium: There is no evidence of pericardial effusion.   Mitral Valve: The mitral valve is normal in structure. There is mild  thickening of the mitral valve leaflet(s). There is mild calcification of  the mitral valve leaflet(s). Mild mitral annular calcification. Mild  mitral valve regurgitation.   Tricuspid Valve: The tricuspid valve is normal in structure. Tricuspid  valve regurgitation is trivial.   Aortic Valve: The aortic valve is tricuspid. There is moderate  calcification of the aortic valve. There is mild thickening of the aortic  valve. There is mild aortic valve annular calcification. Aortic valve  regurgitation is not visualized. Mild aortic  valve sclerosis is present, with no evidence of aortic valve stenosis.   Pulmonic Valve: The pulmonic valve was grossly normal. Pulmonic valve  regurgitation is not visualized.   Aorta: The aortic root is normal in size and structure. There is mild  (Grade II) atheroma plaque involving the aortic root and ascending aorta.   Venous: The inferior vena cava is normal in size with less than 50%  respiratory variability, suggesting right atrial pressure of 8 mmHg.   IAS/Shunts: The atrial septum is  grossly normal.      LEFT VENTRICLE  PLAX 2D  LVIDd:         5.10 cm      Diastology  LVIDs:         3.60 cm      LV e' medial:    3.83 cm/s  LV PW:         1.60 cm      LV E/e' medial:  13.8  LV IVS:        1.60 cm      LV e' lateral:   5.68 cm/s  LVOT diam:     2.20 cm      LV E/e' lateral: 9.3  LV SV:         73  LV SV Index:   31  LVOT Area:     3.80 cm   Patient Profile     70 y.o. male with a hx of HTN, HLD and CAD with DES to RCA in 2016 and patent LAD and LCX. Also hx of micturition syncope in past, OSA but could not afford cpap, + tobacco use, depression admitted with DOE and elevated but flat troponins.  Assessment & Plan    DOE with mildly elevated troponins and HTN.  Has not been on meds. EF is normal. He does have known CAD with prior stent to RCA. No RWMA.  Known 3 vessel disease may benefit from cardiac cath will defer to Dr. Debara Pickett, it may be with back on meds his symptoms will resolve and do outpt stress test.   But needs BP treated. Continue ASA--EKG with more pronounced T wave inversions today CAD see above. There is mild (Grade II) atheroma plaque involving the aortic root and ascending aorta. Back on statin, will defer to Dr. Debara Pickett  OSA has not been able to wear mask, desat during the night also with bradycardia. But financial issues are huge.  Will put in for home sleep study and see if medicare will cover.  Will follow up with Dr. Harrington Challenger  HLD on statin now but had not been taking.  His LDL is 140.  Now on lipitor 80 and zetia HTN on avapro 150 loperssor 25 and amlodipine. Lozol had been out of meds Depression, this may be behind stopping meds per IM  Tobacco use - discussed stopping. He is trying       For questions or updates, please contact [place] Please consult [email or web address] for contact info under        Signed, Cecilie Kicks, NP  02/03/2021, 8:53 AM

## 2021-02-03 NOTE — Progress Notes (Signed)
[facility] PROGRESS NOTE   James Sawyer HTD:[id number] DOB: [birth year] DOA: 02/01/2021  PCP: Janith Lima, MD  Brief History/Interval Summary: 70 y.o. male with medical history significant of coronary artery disease, stage II kidney disease, hypertension, hyperlipidemia, OSA, morbid obesity presented with DOE x4 weeks.  He has a history of cardiac catherization 11/03/14: mid RCA 30%, Mid-distal Cx 40%, Prox RCA 30%, 2nd Prox RCA 90% to 0% after PCI with DES.  Patient was hospitalized for further management.  Reason for Visit: Dyspnea  Consultants: Cardiology  Procedures: None yet    Subjective/Interval History: Patient denies any chest pain this morning.  Overnight he was noted to be hypoxic while sleeping.  Placed on oxygen.  Denies any shortness of breath this morning.     Assessment/Plan:  Dyspnea on exertion Patient with a known history of coronary artery disease.  He also smokes on a daily basis.  Has approximately 30-pack-year history of smoking.  His symptoms are likely multifactorial.  He was counseled to stop smoking.  He also has a history of sleep apnea but is not compliant with the CPAP.  Overnight hypoxia most likely due to sleep apnea. Nicotine patch.  Will benefit from outpatient pulmonary function testing.  He likely does have COPD.  No wheezing appreciated on examination.  If cardiac stress test is negative we could consider initiating steroid inhaler at discharge.  Coronary artery disease Patient status post PCI with drug-eluting stent placement to RCA in 2016.  Echocardiogram done during this admission shows normal systolic function with grade 1 diastolic dysfunction.  No regional wall motion abnormalities were noted. Overnight EKG was done which shows significant T wave inversions which are new compared to previous EKG.  Unclear as to untoward circumstances this EKG was done.  Discussed with cardiology.  They are planning stress test tomorrow.  No chest  pain currently.  Continue aspirin and statin beta-blocker.  Also on ARB.  Essential hypertension Monitor blood pressures.  Chronic kidney disease stage II Stable.  Monitor urine output.  Obstructive sleep apnea Not compliant with CPAP.  Compliance was emphasized to the patient.  Obesity Estimated body mass index is 37.41 kg/m as calculated from the following:   Height as of this encounter: 5\' 10"  (1.778 m).   Weight as of this encounter: 118.3 kg.    DVT Prophylaxis: Subcutaneous heparin Code Status: Full code Family Communication: Discussed with patient Disposition Plan: Hopefully home tomorrow after stress test  Status is: Observation  The patient will require care spanning > 2 midnights and should be moved to inpatient because: Needs to undergo stress test     Medications: Scheduled:  amLODipine  5 mg Oral Daily   aspirin  81 mg Oral Daily   atorvastatin  80 mg Oral Daily   buPROPion  150 mg Oral Daily   COVID-19 mRNA bivalent vaccine (Pfizer)  0.3 mL Intramuscular Once   DULoxetine  60 mg Oral Daily   ezetimibe  10 mg Oral Daily   heparin injection (subcutaneous)  5,000 Units Subcutaneous Q8H   indapamide  1.25 mg Oral Daily   irbesartan  150 mg Oral Daily   metoprolol tartrate  25 mg Oral BID   potassium chloride  20 mEq Oral BID   thiamine  100 mg Oral Daily   Continuous: BWI:OMBTDHRCBULAG, ondansetron (ZOFRAN) IV, zolpidem  Antibiotics: Anti-infectives (From admission, onward)    None       Objective:  Vital Signs  Vitals:   02/02/21 2315 02/03/21  0000 02/03/21 0506 02/03/21 0811  BP: (!) 147/118 (!) 145/74 (!) 164/102 (!) 144/96  Pulse: 62  63 (!) 55  Resp:   15   Temp: 98.2 F (36.8 C)  98.4 F (36.9 C) 98.5 F (36.9 C)  TempSrc: Oral  Oral Oral  SpO2: 92%  100% 94%  Weight:      Height:        Intake/Output Summary (Last 24 hours) at 02/03/2021 1154 Last data filed at 02/03/2021 0507 Gross per 24 hour  Intake 480 ml  Output 350  ml  Net 130 ml   Filed Weights   02/02/21 1415  Weight: 118.3 kg    General appearance: Awake alert.  In no distress Resp: Clear to auscultation bilaterally.  Normal effort Cardio: S1-S2 is normal regular.  No S3-S4.  No rubs murmurs or bruit GI: Abdomen is soft.  Nontender nondistended.  Bowel sounds are present normal.  No masses organomegaly Extremities: No edema.  Full range of motion of lower extremities. Neurologic: Alert and oriented x3.  No focal neurological deficits.    Lab Results:  Data Reviewed: I have personally reviewed following labs and imaging studies  CBC: Recent Labs  Lab 02/01/21 1428 02/01/21 1843 02/03/21 0846  WBC 5.5 6.3 5.9  NEUTROABS 2.8  --   --   HGB 14.6 14.5 14.5  HCT 44.5 44.5 44.4  MCV 96.3 95.7 96.5  PLT 364.0 392 734    Basic Metabolic Panel: Recent Labs  Lab 02/01/21 1428 02/01/21 1843 02/03/21 0846  NA 145 142 139  K 3.8 3.2* 3.6  CL 107 108 104  CO2 30 27 28   GLUCOSE 99 144* 106*  BUN 17 18 16   CREATININE 1.64* 1.67* 1.51*  CALCIUM 9.3 9.1 8.7*    GFR: Estimated Creatinine Clearance: 58.7 mL/min (A) (by C-G formula based on SCr of 1.51 mg/dL (H)).    BNP (last 3 results) Recent Labs    02/01/21 1428  PROBNP 284    HbA1C: Recent Labs    02/01/21 1428  HGBA1C 6.2     Lipid Profile: Recent Labs    02/01/21 1428  CHOL 217*  HDL 51.90  LDLCALC 140*  TRIG 124.0  CHOLHDL 4    Thyroid Function Tests: Recent Labs    02/01/21 1428  TSH 5.23    Anemia Panel: Recent Labs    02/01/21 1428  VITAMINB12 220  FOLATE 8.6  FERRITIN 22.7  TIBC 397.6  IRON 134    Recent Results (from the past 240 hour(s))  Resp Panel by RT-PCR (Flu A&B, Covid) Nasopharyngeal Swab     Status: None   Collection Time: 02/02/21  6:35 AM   Specimen: Nasopharyngeal Swab; Nasopharyngeal(NP) swabs in vial transport medium  Result Value Ref Range Status   SARS Coronavirus 2 by RT PCR NEGATIVE NEGATIVE Final    Comment:  (NOTE) SARS-CoV-2 target nucleic acids are NOT DETECTED.  The SARS-CoV-2 RNA is generally detectable in upper respiratory specimens during the acute phase of infection. The lowest concentration of SARS-CoV-2 viral copies this assay can detect is 138 copies/mL. A negative result does not preclude SARS-Cov-2 infection and should not be used as the sole basis for treatment or other patient management decisions. A negative result may occur with  improper specimen collection/handling, submission of specimen other than nasopharyngeal swab, presence of viral mutation(s) within the areas targeted by this assay, and inadequate number of viral copies(<138 copies/mL). A negative result must be combined with clinical observations, patient  history, and epidemiological information. The expected result is Negative.  Fact Sheet for Patients:  EntrepreneurPulse.com.au  Fact Sheet for Healthcare Providers:  IncredibleEmployment.be  This test is no t yet approved or cleared by the [place] FDA and  has been authorized for detection and/or diagnosis of SARS-CoV-2 by FDA under an Emergency Use Authorization (EUA). This EUA will remain  in effect (meaning this test can be used) for the duration of the COVID-19 declaration under Section 564(b)(1) of the Act, 21 U.S.C.section 360bbb-3(b)(1), unless the authorization is terminated  or revoked sooner.       Influenza A by PCR NEGATIVE NEGATIVE Final   Influenza B by PCR NEGATIVE NEGATIVE Final    Comment: (NOTE) The Xpert Xpress SARS-CoV-2/FLU/RSV plus assay is intended as an aid in the diagnosis of influenza from Nasopharyngeal swab specimens and should not be used as a sole basis for treatment. Nasal washings and aspirates are unacceptable for Xpert Xpress SARS-CoV-2/FLU/RSV testing.  Fact Sheet for Patients: EntrepreneurPulse.com.au  Fact Sheet for Healthcare  Providers: IncredibleEmployment.be  This test is not yet approved or cleared by the [place] FDA and has been authorized for detection and/or diagnosis of SARS-CoV-2 by FDA under an Emergency Use Authorization (EUA). This EUA will remain in effect (meaning this test can be used) for the duration of the COVID-19 declaration under Section 564(b)(1) of the Act, 21 U.S.C. section 360bbb-3(b)(1), unless the authorization is terminated or revoked.  Performed at [facility], [place] [address], [place], [place] [address]       Radiology Studies: DG Chest 2 View  Result Date: 02/01/2021 CLINICAL DATA:  Shortness of breath EXAM: CHEST - 2 VIEW COMPARISON:  08/19/2018 FINDINGS: The heart size and mediastinal contours are within normal limits. Both lungs are clear. The visualized skeletal structures are unremarkable. Mild chronic elevation right diaphragm. IMPRESSION: No active cardiopulmonary disease. Electronically Signed   By: Donavan Foil M.D.   On: 02/01/2021 19:48   ECHOCARDIOGRAM COMPLETE  Result Date: 02/02/2021    ECHOCARDIOGRAM REPORT   Patient Name:   James Sawyer Date of Exam: 02/02/2021 Medical Rec #:  [id number]     Height:       70.0 in Accession #:    [id number]    Weight:       260.0 lb Date of Birth:  1950/09/07      BSA:          2.334 m Patient Age:    76 years      BP:           196/99 mmHg Patient Gender: M             HR:           59 bpm. Exam Location:  Inpatient Procedure: 2D Echo, Color Doppler, Cardiac Doppler and Intracardiac            Opacification Agent Indications:     I25.110 Atherosclerotic heart disease of native coronary artery                  with unstable angina pectoris  History:         Patient has prior history of Echocardiogram examinations, most                  recent 10/22/2014. CAD.  Sonographer:     Roseanna Rainbow RDCS Referring Phys:  Wilson Diagnosing Phys: Dixie Dials MD IMPRESSIONS  1. Left ventricular ejection  fraction,  by estimation, is 55 to 60%. The left ventricle has normal function. The left ventricle has no regional wall motion abnormalities. There is moderate concentric left ventricular hypertrophy. Left ventricular diastolic parameters are consistent with Grade I diastolic dysfunction (impaired relaxation).  2. Right ventricular systolic function is normal. The right ventricular size is normal.  3. The mitral valve is normal in structure. Mild mitral valve regurgitation.  4. The aortic valve is tricuspid. There is moderate calcification of the aortic valve. There is mild thickening of the aortic valve. Aortic valve regurgitation is not visualized. Mild aortic valve sclerosis is present, with no evidence of aortic valve stenosis.  5. There is mild (Grade II) atheroma plaque involving the aortic root and ascending aorta.  6. The inferior vena cava is normal in size with <50% respiratory variability, suggesting right atrial pressure of 8 mmHg. FINDINGS  Left Ventricle: Left ventricular ejection fraction, by estimation, is 55 to 60%. The left ventricle has normal function. The left ventricle has no regional wall motion abnormalities. Definity contrast agent was given IV to delineate the left ventricular  endocardial borders. The left ventricular internal cavity size was normal in size. There is moderate concentric left ventricular hypertrophy. Left ventricular diastolic parameters are consistent with Grade I diastolic dysfunction (impaired relaxation). Right Ventricle: The right ventricular size is normal. No increase in right ventricular wall thickness. Right ventricular systolic function is normal. Left Atrium: Left atrial size was normal in size. Right Atrium: Right atrial size was not assessed. Pericardium: There is no evidence of pericardial effusion. Mitral Valve: The mitral valve is normal in structure. There is mild thickening of the mitral valve leaflet(s). There is mild calcification of the mitral valve  leaflet(s). Mild mitral annular calcification. Mild mitral valve regurgitation. Tricuspid Valve: The tricuspid valve is normal in structure. Tricuspid valve regurgitation is trivial. Aortic Valve: The aortic valve is tricuspid. There is moderate calcification of the aortic valve. There is mild thickening of the aortic valve. There is mild aortic valve annular calcification. Aortic valve regurgitation is not visualized. Mild aortic valve sclerosis is present, with no evidence of aortic valve stenosis. Pulmonic Valve: The pulmonic valve was grossly normal. Pulmonic valve regurgitation is not visualized. Aorta: The aortic root is normal in size and structure. There is mild (Grade II) atheroma plaque involving the aortic root and ascending aorta. Venous: The inferior vena cava is normal in size with less than 50% respiratory variability, suggesting right atrial pressure of 8 mmHg. IAS/Shunts: The atrial septum is grossly normal.  LEFT VENTRICLE PLAX 2D LVIDd:         5.10 cm      Diastology LVIDs:         3.60 cm      LV e' medial:    3.83 cm/s LV PW:         1.60 cm      LV E/e' medial:  13.8 LV IVS:        1.60 cm      LV e' lateral:   5.68 cm/s LVOT diam:     2.20 cm      LV E/e' lateral: 9.3 LV SV:         73 LV SV Index:   31 LVOT Area:     3.80 cm                              3D Volume EF: LV Volumes (MOD)  3D EF:        40 % LV vol d, MOD A2C: 110.0 ml LV EDV:       186 ml LV vol d, MOD A4C: 76.4 ml  LV ESV:       112 ml LV vol s, MOD A2C: 59.8 ml  LV SV:        74 ml LV vol s, MOD A4C: 44.0 ml LV SV MOD A2C:     50.2 ml LV SV MOD A4C:     76.4 ml LV SV MOD BP:      42.8 ml RIGHT VENTRICLE RV S prime:     8.61 cm/s TAPSE (M-mode): 1.4 cm LEFT ATRIUM             Index        RIGHT ATRIUM           Index LA diam:        4.30 cm 1.84 cm/m   RA Area:     11.50 cm LA Vol (A2C):   61.3 ml 26.27 ml/m  RA Volume:   22.50 ml  9.64 ml/m LA Vol (A4C):   29.0 ml 12.43 ml/m LA Biplane Vol: 42.8 ml 18.34 ml/m   AORTIC VALVE LVOT Vmax:   113.00 cm/s LVOT Vmean:  65.100 cm/s LVOT VTI:    0.193 m  AORTA Ao Root diam: 3.70 cm Ao Asc diam:  3.80 cm MITRAL VALVE MV Area (PHT): 3.68 cm    SHUNTS MV Decel Time: 206 msec    Systemic VTI:  0.19 m MV E velocity: 52.70 cm/s  Systemic Diam: 2.20 cm MV A velocity: 67.10 cm/s MV E/A ratio:  0.79 Dixie Dials MD Electronically signed by Dixie Dials MD Signature Date/Time: 02/02/2021/12:48:56 PM    Final        LOS: 0 days   [facility] Pager on [email or web address]  02/03/2021, 11:54 AM

## 2021-02-03 NOTE — Care Management Obs Status (Signed)
[facility] NOTIFICATION   Patient Details  Name: James Sawyer MRN: [id number] Date of Birth: [birth year]   Medicare Observation Status Notification Given:  Yes    Bethena Roys, RN 02/03/2021, 4:53 PM

## 2021-02-03 NOTE — Discharge Instructions (Signed)
Our office will schedule you a sleep study, we hope a home study but the office will be in contact.  Dr. Alan Ripper office.

## 2021-02-03 NOTE — Plan of Care (Signed)
  Problem: Education: Goal: Knowledge of General Education information will improve Description: Including pain rating scale, medication(s)/side effects and non-pharmacologic comfort measures Outcome: Progressing   Problem: Health Behavior/Discharge Planning: Goal: Ability to manage health-related needs will improve Outcome: Progressing   Problem: Clinical Measurements: Goal: Ability to maintain clinical measurements within normal limits will improve Outcome: Progressing   Problem: Clinical Measurements: Goal: Diagnostic test results will improve Outcome: Progressing   Problem: Clinical Measurements: Goal: Cardiovascular complication will be avoided Outcome: Progressing   Problem: Clinical Measurements: Goal: Respiratory complications will improve Outcome: Progressing   Problem: Safety: Goal: Ability to remain free from injury will improve Outcome: Progressing

## 2021-02-03 NOTE — Progress Notes (Signed)
Mobility Specialist Progress Note:   02/03/21 1513  Mobility  Activity Ambulated in hall  Level of Assistance Independent  Distance Ambulated (ft) 940 ft  Mobility Ambulated with assistance in hallway  Mobility Response Tolerated well  Mobility performed by Mobility specialist  $Mobility charge 1 Mobility   Pt received in bed willing to participate in mobility. No complaints of pain and asymptomatic. Pt returned to bed with call bell in reach and all needs met.   [facility] Health and safety inspector Phone [phone]

## 2021-02-03 NOTE — Progress Notes (Addendum)
Awake. Monitor showing Afib rhythm. EKG done - SR with sinus arrhythmia and 1st degree AV block. Asymptomatic. Will monitor.    Episodes of sleeping HR of 37 bpm but quickly went up to 50's. Asymptomatic. Will monitor.

## 2021-02-04 ENCOUNTER — Observation Stay (HOSPITAL_BASED_OUTPATIENT_CLINIC_OR_DEPARTMENT_OTHER): Payer: Medicare HMO

## 2021-02-04 DIAGNOSIS — R0609 Other forms of dyspnea: Secondary | ICD-10-CM | POA: Diagnosis not present

## 2021-02-04 DIAGNOSIS — I2510 Atherosclerotic heart disease of native coronary artery without angina pectoris: Secondary | ICD-10-CM | POA: Diagnosis not present

## 2021-02-04 DIAGNOSIS — I2583 Coronary atherosclerosis due to lipid rich plaque: Secondary | ICD-10-CM | POA: Diagnosis not present

## 2021-02-04 DIAGNOSIS — R778 Other specified abnormalities of plasma proteins: Secondary | ICD-10-CM | POA: Diagnosis not present

## 2021-02-04 DIAGNOSIS — E785 Hyperlipidemia, unspecified: Secondary | ICD-10-CM

## 2021-02-04 DIAGNOSIS — I10 Essential (primary) hypertension: Secondary | ICD-10-CM

## 2021-02-04 LAB — BASIC METABOLIC PANEL
Anion gap: 6 (ref 5–15)
BUN: 18 mg/dL (ref 8–23)
CO2: 28 mmol/L (ref 22–32)
Calcium: 8.8 mg/dL — ABNORMAL LOW (ref 8.9–10.3)
Chloride: 105 mmol/L (ref 98–111)
Creatinine, Ser: 1.48 mg/dL — ABNORMAL HIGH (ref 0.61–1.24)
GFR, Estimated: 51 mL/min — ABNORMAL LOW (ref >60)
Glucose, Bld: 128 mg/dL — ABNORMAL HIGH (ref 70–99)
Potassium: 3.4 mmol/L — ABNORMAL LOW (ref 3.5–5.1)
Sodium: 139 mmol/L (ref 135–145)

## 2021-02-04 LAB — MAGNESIUM: Magnesium: 2.2 mg/dL (ref 1.7–2.4)

## 2021-02-04 LAB — NM MYOCAR MULTI W/SPECT W/WALL MOTION / EF
Estimated workload: 1
Exercise duration (min): 0 min
Exercise duration (sec): 0 s
MPHR: 150 {beats}/min
Peak HR: 79 {beats}/min
Percent HR: 52 %
Rest HR: 60 {beats}/min
ST Depression (mm): 0 mm

## 2021-02-04 MED ORDER — REGADENOSON 0.4 MG/5ML IV SOLN
0.4000 mg | Freq: Once | INTRAVENOUS | Status: AC
  Filled 2021-02-04: qty 5

## 2021-02-04 MED ORDER — POTASSIUM CHLORIDE ER 20 MEQ PO TBCR: 20.0000 meq | EXTENDED_RELEASE_TABLET | Freq: Every day | ORAL | 0 refills | Status: DC

## 2021-02-04 MED ORDER — AMLODIPINE BESYLATE 5 MG PO TABS: 10.0000 mg | ORAL_TABLET | Freq: Every day | ORAL | 1 refills | Status: DC

## 2021-02-04 MED ORDER — REGADENOSON 0.4 MG/5ML IV SOLN
INTRAVENOUS | Status: AC
  Administered 2021-02-04: 0.4 mg via INTRAVENOUS
  Filled 2021-02-04: qty 5

## 2021-02-04 MED ORDER — POTASSIUM CHLORIDE CRYS ER 20 MEQ PO TBCR
20.0000 meq | EXTENDED_RELEASE_TABLET | Freq: Once | ORAL | Status: AC
  Administered 2021-02-04: 20 meq via ORAL
  Filled 2021-02-04: qty 1

## 2021-02-04 MED ORDER — TECHNETIUM TC 99M TETROFOSMIN IV KIT
11.0000 | PACK | Freq: Once | INTRAVENOUS | Status: AC | PRN
  Administered 2021-02-04: 11 via INTRAVENOUS

## 2021-02-04 MED ORDER — CYANOCOBALAMIN 2000 MCG PO TABS: 2000.0000 ug | ORAL_TABLET | Freq: Every day | ORAL | 1 refills | Status: AC

## 2021-02-04 MED ORDER — AMLODIPINE BESYLATE 5 MG PO TABS: 5.0000 mg | ORAL_TABLET | Freq: Every day | ORAL | 1 refills | Status: DC

## 2021-02-04 MED ORDER — CHOLECALCIFEROL 1.25 MG (50000 UT) PO CAPS: 50000.0000 [IU] | ORAL_CAPSULE | ORAL | 1 refills | Status: AC

## 2021-02-04 MED ORDER — IRBESARTAN 150 MG PO TABS: 150.0000 mg | ORAL_TABLET | Freq: Every day | ORAL | 1 refills | Status: DC

## 2021-02-04 MED ORDER — DULOXETINE HCL 60 MG PO CPEP: 60.0000 mg | ORAL_CAPSULE | Freq: Every day | ORAL | 0 refills | Status: DC

## 2021-02-04 MED ORDER — HYDRALAZINE HCL 20 MG/ML IJ SOLN: 5.0000 mg | Freq: Once | INTRAMUSCULAR | Status: AC

## 2021-02-04 MED ORDER — VITAMIN B-1 100 MG PO TABS: 100.0000 mg | ORAL_TABLET | Freq: Every day | ORAL | 1 refills | Status: AC

## 2021-02-04 MED ORDER — ASPIRIN 81 MG PO TABS: 81.0000 mg | ORAL_TABLET | Freq: Every day | ORAL | 1 refills | Status: AC

## 2021-02-04 MED ORDER — HYDRALAZINE HCL 20 MG/ML IJ SOLN
INTRAMUSCULAR | Status: AC
  Administered 2021-02-04: 5 mg via INTRAVENOUS
  Filled 2021-02-04: qty 1

## 2021-02-04 MED ORDER — INDAPAMIDE 1.25 MG PO TABS: 1.2500 mg | ORAL_TABLET | Freq: Every day | ORAL | 1 refills | Status: DC

## 2021-02-04 MED ORDER — ATORVASTATIN CALCIUM 80 MG PO TABS
80.0000 mg | ORAL_TABLET | Freq: Every day | ORAL | 1 refills | Status: DC
Start: 2021-02-04 — End: 2021-02-28

## 2021-02-04 MED ORDER — AMLODIPINE BESYLATE 10 MG PO TABS
10.0000 mg | ORAL_TABLET | Freq: Every day | ORAL | Status: DC
  Administered 2021-02-04: 10 mg via ORAL
  Filled 2021-02-04: qty 1

## 2021-02-04 MED ORDER — TECHNETIUM TC 99M TETROFOSMIN IV KIT
33.0000 | PACK | Freq: Once | INTRAVENOUS | Status: AC | PRN
  Administered 2021-02-04: 33 via INTRAVENOUS

## 2021-02-04 MED ORDER — NICOTINE 14 MG/24HR TD PT24: 14.0000 mg | MEDICATED_PATCH | Freq: Every day | TRANSDERMAL | 0 refills | Status: DC

## 2021-02-04 MED ORDER — EZETIMIBE 10 MG PO TABS: 10.0000 mg | ORAL_TABLET | Freq: Every day | ORAL | 1 refills | Status: DC

## 2021-02-04 MED ORDER — BUPROPION HCL ER (XL) 150 MG PO TB24: 150.0000 mg | ORAL_TABLET | Freq: Every day | ORAL | 0 refills | Status: DC

## 2021-02-04 NOTE — Progress Notes (Signed)
   James Sawyer presented for a nuclear stress test today.  No immediate complications.  Stress imaging is pending at this time.  Preliminary EKG findings may be listed in the chart, but the stress test result will not be finalized until perfusion imaging is complete.  One day study, [facility] to read.  Patient was given IV hydralazine 5mg  x1 dose in nuclear medicine given markedly elevated BP. Patient to receive scheduled BP medications upon return to the floor.   Abigail Butts, PA-C 02/04/2021, 10:46 AM

## 2021-02-04 NOTE — Discharge Summary (Signed)
[facility]  Physician Discharge Summary   Patient ID: James Sawyer MRN: [id number] DOB/AGE: 1951-03-09 70 y.o.  Admit date: 02/01/2021 Discharge date: 02/04/2021    PCP: Janith Lima, MD  DISCHARGE DIAGNOSES:  Dyspnea on exertion possibly due to tobacco abuse and underlying COPD Coronary artery disease Essential hypertension Chronic kidney disease stage II Obstructive sleep apnea  RECOMMENDATIONS FOR OUTPATIENT FOLLOW UP: Consider referral to pulmonology for pulmonary function tests   Home Health: None Equipment/Devices: None  CODE STATUS: Full code  DISCHARGE CONDITION: fair  Diet recommendation: As before  INITIAL HISTORY: 70 y.o. male with medical history significant of coronary artery disease, stage II kidney disease, hypertension, hyperlipidemia, OSA, morbid obesity presented with DOE x4 weeks.  He has a history of cardiac catherization 11/03/14: mid RCA 30%, Mid-distal Cx 40%, Prox RCA 30%, 2nd Prox RCA 90% to 0% after PCI with DES.  Patient was hospitalized for further management.   Reason for Visit: Dyspnea   Consultants: Cardiology   Procedures:  Nuclear stress test was low risk Transthoracic echocardiogram (see report below)   HOSPITAL COURSE:   Dyspnea on exertion Patient with a known history of coronary artery disease.  He also smokes on a daily basis.  Has approximately 30-pack-year history of smoking.  His symptoms are likely multifactorial.  He was counseled to stop smoking.  Will benefit from outpatient pulmonary function testing.  He likely does have COPD.  No wheezing appreciated on examination.  He also has a history of sleep apnea but is not compliant with the CPAP.  Overnight hypoxia most likely due to sleep apnea.   Coronary artery disease Patient status post PCI with drug-eluting stent placement to RCA in 2016.  Echocardiogram done during this admission shows normal systolic function with grade 1 diastolic dysfunction.  No regional  wall motion abnormalities were noted. Overnight EKG on 10/29 was done which shows significant T wave inversions which are new compared to previous EKG.  Unclear as to untoward circumstances this EKG was done.  Cardiology ordered stress test which was done today which was low risk.  He may continue his home medications at discharge.    Essential hypertension Stable  Chronic kidney disease stage II Stable.     Obstructive sleep apnea Not compliant with CPAP.  Compliance was emphasized to the patient.   Obesity Estimated body mass index is 37.41 kg/m as calculated from the following:   Height as of this encounter: 5\' 10"  (1.778 m).   Weight as of this encounter: 118.3 kg.  Okay for discharge today.   PERTINENT LABS:  The results of significant diagnostics from this hospitalization (including imaging, microbiology, ancillary and laboratory) are listed below for reference.    Microbiology: Recent Results (from the past 240 hour(s))  Resp Panel by RT-PCR (Flu A&B, Covid) Nasopharyngeal Swab     Status: None   Collection Time: 02/02/21  6:35 AM   Specimen: Nasopharyngeal Swab; Nasopharyngeal(NP) swabs in vial transport medium  Result Value Ref Range Status   SARS Coronavirus 2 by RT PCR NEGATIVE NEGATIVE Final    Comment: (NOTE) SARS-CoV-2 target nucleic acids are NOT DETECTED.  The SARS-CoV-2 RNA is generally detectable in upper respiratory specimens during the acute phase of infection. The lowest concentration of SARS-CoV-2 viral copies this assay can detect is 138 copies/mL. A negative result does not preclude SARS-Cov-2 infection and should not be used as the sole basis for treatment or other patient management decisions. A negative result may occur with  improper specimen collection/handling, submission of specimen other than nasopharyngeal swab, presence of viral mutation(s) within the areas targeted by this assay, and inadequate number of viral copies(<138 copies/mL). A  negative result must be combined with clinical observations, patient history, and epidemiological information. The expected result is Negative.  Fact Sheet for Patients:  EntrepreneurPulse.com.au  Fact Sheet for Healthcare Providers:  IncredibleEmployment.be  This test is no t yet approved or cleared by the [place] FDA and  has been authorized for detection and/or diagnosis of SARS-CoV-2 by FDA under an Emergency Use Authorization (EUA). This EUA will remain  in effect (meaning this test can be used) for the duration of the COVID-19 declaration under Section 564(b)(1) of the Act, 21 U.S.C.section 360bbb-3(b)(1), unless the authorization is terminated  or revoked sooner.       Influenza A by PCR NEGATIVE NEGATIVE Final   Influenza B by PCR NEGATIVE NEGATIVE Final    Comment: (NOTE) The Xpert Xpress SARS-CoV-2/FLU/RSV plus assay is intended as an aid in the diagnosis of influenza from Nasopharyngeal swab specimens and should not be used as a sole basis for treatment. Nasal washings and aspirates are unacceptable for Xpert Xpress SARS-CoV-2/FLU/RSV testing.  Fact Sheet for Patients: EntrepreneurPulse.com.au  Fact Sheet for Healthcare Providers: IncredibleEmployment.be  This test is not yet approved or cleared by the [place] FDA and has been authorized for detection and/or diagnosis of SARS-CoV-2 by FDA under an Emergency Use Authorization (EUA). This EUA will remain in effect (meaning this test can be used) for the duration of the COVID-19 declaration under Section 564(b)(1) of the Act, 21 U.S.C. section 360bbb-3(b)(1), unless the authorization is terminated or revoked.  Performed at [facility], [place] [address] [place], [place] [address]      Labs:  COVID-19 Labs   Lab Results  Component Value Date   Walls NEGATIVE 02/02/2021      Basic Metabolic Panel: Recent Labs   Lab 02/01/21 1428 02/01/21 1843 02/03/21 0846 02/04/21 0154  NA 145 142 139 139  K 3.8 3.2* 3.6 3.4*  CL 107 108 104 105  CO2 30 27 28 28   GLUCOSE 99 144* 106* 128*  BUN 17 18 16 18   CREATININE 1.64* 1.67* 1.51* 1.48*  CALCIUM 9.3 9.1 8.7* 8.8*  MG  --   --   --  2.2    CBC: Recent Labs  Lab 02/01/21 1428 02/01/21 1843 02/03/21 0846  WBC 5.5 6.3 5.9  NEUTROABS 2.8  --   --   HGB 14.6 14.5 14.5  HCT 44.5 44.5 44.4  MCV 96.3 95.7 96.5  PLT 364.0 392 400    BNP: BNP (last 3 results) Recent Labs    02/01/21 1845  BNP 61.8    ProBNP (last 3 results) Recent Labs    02/01/21 1428  PROBNP 284      IMAGING STUDIES DG Chest 2 View  Result Date: 02/01/2021 CLINICAL DATA:  Shortness of breath EXAM: CHEST - 2 VIEW COMPARISON:  08/19/2018 FINDINGS: The heart size and mediastinal contours are within normal limits. Both lungs are clear. The visualized skeletal structures are unremarkable. Mild chronic elevation right diaphragm. IMPRESSION: No active cardiopulmonary disease. Electronically Signed   By: Donavan Foil M.D.   On: 02/01/2021 19:48   NM Myocar Multi W/Spect W/Wall Motion / EF  Result Date: 02/04/2021   Findings are consistent with no prior ischemia. The study is low risk.   No ST deviation was noted.   The left ventricular ejection  fraction is moderately decreased (30-44%). End diastolic cavity size is normal. End systolic cavity size is normal. Probable normal stress nuclear study with thinning of the inferobasal wall but no ischemia; gated EF calculated at 36 but visually appears normal; suggest echocardiogram to better assess LV function.   ECHOCARDIOGRAM COMPLETE  Result Date: 02/02/2021    ECHOCARDIOGRAM REPORT   Patient Name:   MAYSON MCNEISH Date of Exam: 02/02/2021 Medical Rec #:  [id number]     Height:       70.0 in Accession #:    [id number]    Weight:       260.0 lb Date of Birth:  18-Jun-1950      BSA:          2.334 m Patient Age:    21 years       BP:           196/99 mmHg Patient Gender: M             HR:           59 bpm. Exam Location:  Inpatient Procedure: 2D Echo, Color Doppler, Cardiac Doppler and Intracardiac            Opacification Agent Indications:     I25.110 Atherosclerotic heart disease of native coronary artery                  with unstable angina pectoris  History:         Patient has prior history of Echocardiogram examinations, most                  recent 10/22/2014. CAD.  Sonographer:     Roseanna Rainbow RDCS Referring Phys:  Naomi Diagnosing Phys: Dixie Dials MD IMPRESSIONS  1. Left ventricular ejection fraction, by estimation, is 55 to 60%. The left ventricle has normal function. The left ventricle has no regional wall motion abnormalities. There is moderate concentric left ventricular hypertrophy. Left ventricular diastolic parameters are consistent with Grade I diastolic dysfunction (impaired relaxation).  2. Right ventricular systolic function is normal. The right ventricular size is normal.  3. The mitral valve is normal in structure. Mild mitral valve regurgitation.  4. The aortic valve is tricuspid. There is moderate calcification of the aortic valve. There is mild thickening of the aortic valve. Aortic valve regurgitation is not visualized. Mild aortic valve sclerosis is present, with no evidence of aortic valve stenosis.  5. There is mild (Grade II) atheroma plaque involving the aortic root and ascending aorta.  6. The inferior vena cava is normal in size with <50% respiratory variability, suggesting right atrial pressure of 8 mmHg. FINDINGS  Left Ventricle: Left ventricular ejection fraction, by estimation, is 55 to 60%. The left ventricle has normal function. The left ventricle has no regional wall motion abnormalities. Definity contrast agent was given IV to delineate the left ventricular  endocardial borders. The left ventricular internal cavity size was normal in size. There is moderate concentric left ventricular  hypertrophy. Left ventricular diastolic parameters are consistent with Grade I diastolic dysfunction (impaired relaxation). Right Ventricle: The right ventricular size is normal. No increase in right ventricular wall thickness. Right ventricular systolic function is normal. Left Atrium: Left atrial size was normal in size. Right Atrium: Right atrial size was not assessed. Pericardium: There is no evidence of pericardial effusion. Mitral Valve: The mitral valve is normal in structure. There is mild thickening of the mitral valve leaflet(s). There is mild calcification  of the mitral valve leaflet(s). Mild mitral annular calcification. Mild mitral valve regurgitation. Tricuspid Valve: The tricuspid valve is normal in structure. Tricuspid valve regurgitation is trivial. Aortic Valve: The aortic valve is tricuspid. There is moderate calcification of the aortic valve. There is mild thickening of the aortic valve. There is mild aortic valve annular calcification. Aortic valve regurgitation is not visualized. Mild aortic valve sclerosis is present, with no evidence of aortic valve stenosis. Pulmonic Valve: The pulmonic valve was grossly normal. Pulmonic valve regurgitation is not visualized. Aorta: The aortic root is normal in size and structure. There is mild (Grade II) atheroma plaque involving the aortic root and ascending aorta. Venous: The inferior vena cava is normal in size with less than 50% respiratory variability, suggesting right atrial pressure of 8 mmHg. IAS/Shunts: The atrial septum is grossly normal.  LEFT VENTRICLE PLAX 2D LVIDd:         5.10 cm      Diastology LVIDs:         3.60 cm      LV e' medial:    3.83 cm/s LV PW:         1.60 cm      LV E/e' medial:  13.8 LV IVS:        1.60 cm      LV e' lateral:   5.68 cm/s LVOT diam:     2.20 cm      LV E/e' lateral: 9.3 LV SV:         73 LV SV Index:   31 LVOT Area:     3.80 cm                              3D Volume EF: LV Volumes (MOD)            3D EF:         40 % LV vol d, MOD A2C: 110.0 ml LV EDV:       186 ml LV vol d, MOD A4C: 76.4 ml  LV ESV:       112 ml LV vol s, MOD A2C: 59.8 ml  LV SV:        74 ml LV vol s, MOD A4C: 44.0 ml LV SV MOD A2C:     50.2 ml LV SV MOD A4C:     76.4 ml LV SV MOD BP:      42.8 ml RIGHT VENTRICLE RV S prime:     8.61 cm/s TAPSE (M-mode): 1.4 cm LEFT ATRIUM             Index        RIGHT ATRIUM           Index LA diam:        4.30 cm 1.84 cm/m   RA Area:     11.50 cm LA Vol (A2C):   61.3 ml 26.27 ml/m  RA Volume:   22.50 ml  9.64 ml/m LA Vol (A4C):   29.0 ml 12.43 ml/m LA Biplane Vol: 42.8 ml 18.34 ml/m  AORTIC VALVE LVOT Vmax:   113.00 cm/s LVOT Vmean:  65.100 cm/s LVOT VTI:    0.193 m  AORTA Ao Root diam: 3.70 cm Ao Asc diam:  3.80 cm MITRAL VALVE MV Area (PHT): 3.68 cm    SHUNTS MV Decel Time: 206 msec    Systemic VTI:  0.19 m MV E velocity: 52.70 cm/s  Systemic Diam:  2.20 cm MV A velocity: 67.10 cm/s MV E/A ratio:  0.79 Dixie Dials MD Electronically signed by Dixie Dials MD Signature Date/Time: 02/02/2021/12:48:56 PM    Final     DISCHARGE EXAMINATION: Vitals:   02/04/21 1025 02/04/21 1027 02/04/21 1029 02/04/21 1306  BP: (!) 161/109 (!) 166/101 (!) 162/102 (!) 164/94  Pulse: 74 72 70   Resp:      Temp:      TempSrc:      SpO2:      Weight:      Height:       General appearance: Awake alert.  In no distress Resp: Clear to auscultation bilaterally.  Normal effort Cardio: S1-S2 is normal regular.  No S3-S4.  No rubs murmurs or bruit GI: Abdomen is soft.  Nontender nondistended.  Bowel sounds are present normal.  No masses organomegaly    DISPOSITION: Home  Discharge Instructions     Call MD for:  difficulty breathing, headache or visual disturbances   Complete by: As directed    Call MD for:  extreme fatigue   Complete by: As directed    Call MD for:  persistant dizziness or light-headedness   Complete by: As directed    Call MD for:  persistant nausea and vomiting   Complete by: As directed     Call MD for:  severe uncontrolled pain   Complete by: As directed    Call MD for:  temperature >100.4   Complete by: As directed    Diet - low sodium heart healthy   Complete by: As directed    Discharge instructions   Complete by: As directed    Please make sure to take your medications as prescribed.  A follow-up appointment has been made for you to see a cardiologist.  Please be sure to follow-up with your primary care provider next week.  Seek attention if your symptoms recur.  You were cared for by a hospitalist during your hospital stay. If you have any questions about your discharge medications or the care you received while you were in the hospital after you are discharged, you can call the unit and asked to speak with the hospitalist on call if the hospitalist that took care of you is not available. Once you are discharged, your primary care physician will handle any further medical issues. Please note that NO REFILLS for any discharge medications will be authorized once you are discharged, as it is imperative that you return to your primary care physician (or establish a relationship with a primary care physician if you do not have one) for your aftercare needs so that they can reassess your need for medications and monitor your lab values. If you do not have a primary care physician, you can call [phone] for a physician referral.   Increase activity slowly   Complete by: As directed          Allergies as of 02/04/2021   No Known Allergies      Medication List     TAKE these medications    amLODipine 5 MG tablet Commonly known as: NORVASC Take 2 tablets (10 mg total) by mouth daily. What changed: how much to take   aspirin 81 MG tablet Take 1 tablet (81 mg total) by mouth daily.   atorvastatin 80 MG tablet Commonly known as: LIPITOR Take 1 tablet (80 mg total) by mouth daily.   buPROPion 150 MG 24 hr tablet Commonly known as: WELLBUTRIN XL Take 1 tablet (150  mg  total) by mouth daily.   Cholecalciferol 1.25 MG (50000 UT) capsule Commonly known as: Optimal-D Take 1 capsule (50,000 Units total) by mouth once a week.   cyanocobalamin 2000 MCG tablet Take 1 tablet (2,000 mcg total) by mouth daily.   DULoxetine 60 MG capsule Commonly known as: CYMBALTA Take 1 capsule (60 mg total) by mouth daily.   ezetimibe 10 MG tablet Commonly known as: ZETIA Take 1 tablet (10 mg total) by mouth daily.   indapamide 1.25 MG tablet Commonly known as: LOZOL Take 1 tablet (1.25 mg total) by mouth daily.   irbesartan 150 MG tablet Commonly known as: AVAPRO Take 1 tablet (150 mg total) by mouth daily.   nicotine 14 mg/24hr patch Commonly known as: NICODERM CQ - dosed in mg/24 hours Place 1 patch (14 mg total) onto the skin daily.   Potassium Chloride ER 20 MEQ Tbcr Take 20 mEq by mouth daily. What changed:  how much to take how to take this when to take this additional instructions   thiamine 100 MG tablet Commonly known as: Vitamin B-1 Take 1 tablet (100 mg total) by mouth daily.          Follow-up Information     Fay Records, MD Follow up on 02/16/2021.   Specialty: Cardiology Why: at 2:45 pm with her PA Melina Copa. Contact information: [place] [address] [phone]                 TOTAL DISCHARGE TIME: 35 minutes  [facility] Pager on [email or web address]  02/05/2021, 1:58 PM

## 2021-02-04 NOTE — Progress Notes (Addendum)
Progress Note  Patient Name: James Sawyer Date of Encounter: 02/04/2021  [facility] Cardiologist: Dorris Carnes, MD   Subjective  Denies any chest pain.  SOB only with exertion.  He is n.p.o. for [employer] today  Inpatient Medications    Scheduled Meds:  amLODipine  5 mg Oral Daily   aspirin  81 mg Oral Daily   atorvastatin  80 mg Oral Daily   buPROPion  150 mg Oral Daily   DULoxetine  60 mg Oral Daily   ezetimibe  10 mg Oral Daily   heparin injection (subcutaneous)  5,000 Units Subcutaneous Q8H   indapamide  1.25 mg Oral Daily   irbesartan  150 mg Oral Daily   nicotine  14 mg Transdermal Daily   potassium chloride  20 mEq Oral BID   potassium chloride  20 mEq Oral Once   thiamine  100 mg Oral Daily   Continuous Infusions:  PRN Meds: acetaminophen, ondansetron (ZOFRAN) IV, zolpidem   Vital Signs    Vitals:   02/03/21 0811 02/03/21 1344 02/03/21 2046 02/04/21 0504  BP: (!) 144/96 (!) 143/97 (!) 136/95 (!) 150/91  Pulse: (!) 55 (!) 56 (!) 52   Resp:  16 19 16   Temp: 98.5 F (36.9 C) 98.1 F (36.7 C) 98.1 F (36.7 C) 98.1 F (36.7 C)  TempSrc: Oral Oral Oral Oral  SpO2: 94% 94% 91% 97%  Weight:      Height:        Intake/Output Summary (Last 24 hours) at 02/04/2021 0821 Last data filed at 02/03/2021 2343 Gross per 24 hour  Intake 480 ml  Output 500 ml  Net -20 ml    Last 3 Weights 02/02/2021 02/01/2021 12/18/2019  Weight (lbs) 260 lb 11.2 oz 260 lb 250 lb 6.4 oz  Weight (kg) 118.253 kg 117.935 kg 113.581 kg      Telemetry    Sinus bradycardia to normal sinus rhythm with PACs and 7 beat run of wide-complex tachycardia personally Reviewed  ECG    No new EKG to review Physical Exam   GEN: Well nourished, well developed in no acute distress HEENT: Normal NECK: No JVD; No carotid bruits LYMPHATICS: No lymphadenopathy CARDIAC:RRR, no murmurs, rubs, gallops RESPIRATORY:  Clear to auscultation without rales, wheezing or rhonchi  ABDOMEN:  Soft, non-tender, non-distended MUSCULOSKELETAL:  No edema; No deformity  SKIN: Warm and dry NEUROLOGIC:  Alert and oriented x 3 PSYCHIATRIC:  Normal affect   Labs    High Sensitivity Troponin:   Recent Labs  Lab 02/01/21 1843 02/01/21 2246 02/02/21 1422 02/02/21 1553  TROPONINIHS 75* 72* 77* 70*      Chemistry Recent Labs  Lab 02/01/21 1843 02/03/21 0846 02/04/21 0154  NA 142 139 139  K 3.2* 3.6 3.4*  CL 108 104 105  CO2 27 28 28   GLUCOSE 144* 106* 128*  BUN 18 16 18   CREATININE 1.67* 1.51* 1.48*  CALCIUM 9.1 8.7* 8.8*  MG  --   --  2.2  GFRNONAA 44* 49* 51*  ANIONGAP 7 7 6      Lipids  Recent Labs  Lab 02/01/21 1428  CHOL 217*  TRIG 124.0  HDL 51.90  LDLCALC 140*  CHOLHDL 4     Hematology Recent Labs  Lab 02/01/21 1428 02/01/21 1843 02/03/21 0846  WBC 5.5 6.3 5.9  RBC 4.63 4.65 4.60  HGB 14.6 14.5 14.5  HCT 44.5 44.5 44.4  MCV 96.3 95.7 96.5  MCH  --  31.2 31.5  MCHC  32.8 32.6 32.7  RDW 15.2 14.8 14.6  PLT 364.0 392 400    Thyroid  Recent Labs  Lab 02/01/21 1428  TSH 5.23     BNP Recent Labs  Lab 02/01/21 1428 02/01/21 1845  BNP  --  61.8  PROBNP 284  --      DDimer No results for input(s): DDIMER in the last 168 hours.   Radiology    ECHOCARDIOGRAM COMPLETE  Result Date: 02/02/2021    ECHOCARDIOGRAM REPORT   Patient Name:   KOLDEN DUPEE Date of Exam: 02/02/2021 Medical Rec #:  [id number]     Height:       70.0 in Accession #:    [id number]    Weight:       260.0 lb Date of Birth:  [birth year]      BSA:          2.334 m Patient Age:    70 years      BP:           196/99 mmHg Patient Gender: M             HR:           59 bpm. Exam Location:  Inpatient Procedure: 2D Echo, Color Doppler, Cardiac Doppler and Intracardiac            Opacification Agent Indications:     I25.110 Atherosclerotic heart disease of native coronary artery                  with unstable angina pectoris  History:         Patient has prior history of  Echocardiogram examinations, most                  recent 10/22/2014. CAD.  Sonographer:     Roseanna Rainbow RDCS Referring Phys:  [place] Diagnosing Phys: Dixie Dials MD IMPRESSIONS  1. Left ventricular ejection fraction, by estimation, is 55 to 60%. The left ventricle has normal function. The left ventricle has no regional wall motion abnormalities. There is moderate concentric left ventricular hypertrophy. Left ventricular diastolic parameters are consistent with Grade I diastolic dysfunction (impaired relaxation).  2. Right ventricular systolic function is normal. The right ventricular size is normal.  3. The mitral valve is normal in structure. Mild mitral valve regurgitation.  4. The aortic valve is tricuspid. There is moderate calcification of the aortic valve. There is mild thickening of the aortic valve. Aortic valve regurgitation is not visualized. Mild aortic valve sclerosis is present, with no evidence of aortic valve stenosis.  5. There is mild (Grade II) atheroma plaque involving the aortic root and ascending aorta.  6. The inferior vena cava is normal in size with <50% respiratory variability, suggesting right atrial pressure of 8 mmHg. FINDINGS  Left Ventricle: Left ventricular ejection fraction, by estimation, is 55 to 60%. The left ventricle has normal function. The left ventricle has no regional wall motion abnormalities. Definity contrast agent was given IV to delineate the left ventricular  endocardial borders. The left ventricular internal cavity size was normal in size. There is moderate concentric left ventricular hypertrophy. Left ventricular diastolic parameters are consistent with Grade I diastolic dysfunction (impaired relaxation). Right Ventricle: The right ventricular size is normal. No increase in right ventricular wall thickness. Right ventricular systolic function is normal. Left Atrium: Left atrial size was normal in size. Right Atrium: Right atrial size was not assessed.  Pericardium: There is no evidence of  pericardial effusion. Mitral Valve: The mitral valve is normal in structure. There is mild thickening of the mitral valve leaflet(s). There is mild calcification of the mitral valve leaflet(s). Mild mitral annular calcification. Mild mitral valve regurgitation. Tricuspid Valve: The tricuspid valve is normal in structure. Tricuspid valve regurgitation is trivial. Aortic Valve: The aortic valve is tricuspid. There is moderate calcification of the aortic valve. There is mild thickening of the aortic valve. There is mild aortic valve annular calcification. Aortic valve regurgitation is not visualized. Mild aortic valve sclerosis is present, with no evidence of aortic valve stenosis. Pulmonic Valve: The pulmonic valve was grossly normal. Pulmonic valve regurgitation is not visualized. Aorta: The aortic root is normal in size and structure. There is mild (Grade II) atheroma plaque involving the aortic root and ascending aorta. Venous: The inferior vena cava is normal in size with less than 50% respiratory variability, suggesting right atrial pressure of 8 mmHg. IAS/Shunts: The atrial septum is grossly normal.  LEFT VENTRICLE PLAX 2D LVIDd:         5.10 cm      Diastology LVIDs:         3.60 cm      LV e' medial:    3.83 cm/s LV PW:         1.60 cm      LV E/e' medial:  13.8 LV IVS:        1.60 cm      LV e' lateral:   5.68 cm/s LVOT diam:     2.20 cm      LV E/e' lateral: 9.3 LV SV:         73 LV SV Index:   31 LVOT Area:     3.80 cm                              3D Volume EF: LV Volumes (MOD)            3D EF:        40 % LV vol d, MOD A2C: 110.0 ml LV EDV:       186 ml LV vol d, MOD A4C: 76.4 ml  LV ESV:       112 ml LV vol s, MOD A2C: 59.8 ml  LV SV:        74 ml LV vol s, MOD A4C: 44.0 ml LV SV MOD A2C:     50.2 ml LV SV MOD A4C:     76.4 ml LV SV MOD BP:      42.8 ml RIGHT VENTRICLE RV S prime:     8.61 cm/s TAPSE (M-mode): 1.4 cm LEFT ATRIUM             Index        RIGHT ATRIUM            Index LA diam:        4.30 cm 1.84 cm/m   RA Area:     11.50 cm LA Vol (A2C):   61.3 ml 26.27 ml/m  RA Volume:   22.50 ml  9.64 ml/m LA Vol (A4C):   29.0 ml 12.43 ml/m LA Biplane Vol: 42.8 ml 18.34 ml/m  AORTIC VALVE LVOT Vmax:   113.00 cm/s LVOT Vmean:  65.100 cm/s LVOT VTI:    0.193 m  AORTA Ao Root diam: 3.70 cm Ao Asc diam:  3.80 cm MITRAL VALVE MV Area (PHT): 3.68 cm  SHUNTS MV Decel Time: 206 msec    Systemic VTI:  0.19 m MV E velocity: 52.70 cm/s  Systemic Diam: 2.20 cm MV A velocity: 67.10 cm/s MV E/A ratio:  0.79 Dixie Dials MD Electronically signed by Dixie Dials MD Signature Date/Time: 02/02/2021/12:48:56 PM    Final     Cardiac Studies   Echo 02/02/21 IMPRESSIONS     1. Left ventricular ejection fraction, by estimation, is 55 to 60%. The  left ventricle has normal function. The left ventricle has no regional  wall motion abnormalities. There is moderate concentric left ventricular  hypertrophy. Left ventricular  diastolic parameters are consistent with Grade I diastolic dysfunction  (impaired relaxation).   2. Right ventricular systolic function is normal. The right ventricular  size is normal.   3. The mitral valve is normal in structure. Mild mitral valve  regurgitation.   4. The aortic valve is tricuspid. There is moderate calcification of the  aortic valve. There is mild thickening of the aortic valve. Aortic valve  regurgitation is not visualized. Mild aortic valve sclerosis is present,  with no evidence of aortic valve  stenosis.   5. There is mild (Grade II) atheroma plaque involving the aortic root and  ascending aorta.   6. The inferior vena cava is normal in size with <50% respiratory  variability, suggesting right atrial pressure of 8 mmHg.   FINDINGS   Left Ventricle: Left ventricular ejection fraction, by estimation, is 55  to 60%. The left ventricle has normal function. The left ventricle has no  regional wall motion abnormalities. Definity  contrast agent was given IV  to delineate the left ventricular   endocardial borders. The left ventricular internal cavity size was normal  in size. There is moderate concentric left ventricular hypertrophy. Left  ventricular diastolic parameters are consistent with Grade I diastolic  dysfunction (impaired relaxation).   Right Ventricle: The right ventricular size is normal. No increase in  right ventricular wall thickness. Right ventricular systolic function is  normal.   Left Atrium: Left atrial size was normal in size.   Right Atrium: Right atrial size was not assessed.   Pericardium: There is no evidence of pericardial effusion.   Mitral Valve: The mitral valve is normal in structure. There is mild  thickening of the mitral valve leaflet(s). There is mild calcification of  the mitral valve leaflet(s). Mild mitral annular calcification. Mild  mitral valve regurgitation.   Tricuspid Valve: The tricuspid valve is normal in structure. Tricuspid  valve regurgitation is trivial.   Aortic Valve: The aortic valve is tricuspid. There is moderate  calcification of the aortic valve. There is mild thickening of the aortic  valve. There is mild aortic valve annular calcification. Aortic valve  regurgitation is not visualized. Mild aortic  valve sclerosis is present, with no evidence of aortic valve stenosis.   Pulmonic Valve: The pulmonic valve was grossly normal. Pulmonic valve  regurgitation is not visualized.   Aorta: The aortic root is normal in size and structure. There is mild  (Grade II) atheroma plaque involving the aortic root and ascending aorta.   Venous: The inferior vena cava is normal in size with less than 50%  respiratory variability, suggesting right atrial pressure of 8 mmHg.   IAS/Shunts: The atrial septum is grossly normal.      LEFT VENTRICLE  PLAX 2D  LVIDd:         5.10 cm      Diastology  LVIDs:  3.60 cm      LV e' medial:    3.83 cm/s  LV PW:          1.60 cm      LV E/e' medial:  13.8  LV IVS:        1.60 cm      LV e' lateral:   5.68 cm/s  LVOT diam:     2.20 cm      LV E/e' lateral: 9.3  LV SV:         73  LV SV Index:   31  LVOT Area:     3.80 cm   Patient Profile     70 y.o. male with a hx of HTN, HLD and CAD with DES to RCA in 2016 and patent LAD and LCX. Also hx of micturition syncope in past, OSA but could not afford cpap, + tobacco use, depression admitted with DOE and elevated but flat troponins.  Assessment & Plan    DOE with mildly elevated troponins  -May be related to poorly controlled blood pressure -Has not been on meds.  -EF is normal.  -He does have known CAD with prior stent to RCA.  -Given marked T wave inversions on EKG he is n.p.o. for Lexiscan Myoview today.  -Continue aspirin 81 mg daily   2.  ASCAD -He has a history of DES to the RCA in 2016 and patent LAD and left circumflex at that time  -he has not had any anginal symptoms just shortness of breath -Continue aspirin 81 mg daily, high-dose statin, amlodipine -he is not on beta-blockers due to history of bradycardia  3.  Aortic atherosclerosis  -there is mild (Grade II) atheroma plaque involving the aortic root and ascending aorta.  -Continue high-dose statin  -Needs aggressive control of blood pressure   4.  OSA  -has not been able to wear mask, desat during the night also with bradycardia. But financial issues are huge.   -We will get home sleep study and see if we can get him back on CPAP  -He is not a candidate for inspire device due to his obesity  5.  HLD  -LDL goal less than 70  -on statin now but had not been taking.   -His LDL is 140.   -Now on lipitor 80 and zetia -need FLP and ALT in 6 to 8 weeks  6.  HTN  -BP is still not adequately controlled -Continue Avapro 150 mg daily -No beta-blocker due to bradycardia -Increase amlodipine to 10 mg daily   -Restarted indapamide 1.25 mg yesterday -Serum creatinine stable at 1.48 and  potassium 3.4 today  7.  Hypokalemia -K+ 3.4 today -Repleting currently -Bmet in a.m.  8.  Wide-complex tachycardia -He had 7 beats on telemetry asymptomatic -Occurred in setting of mild hypokalemia -Continue to monitor -No beta-blocker due to bradycardia    I have spent a total of 35 minutes with patient reviewing 2D echo, notes , telemetry, EKGs, labs and examining patient as well as establishing an assessment and plan that was discussed with the patient.  > 50% of time was spent in direct patient care.    For questions or updates, please contact [place] Please consult [email or web address] for contact info under        Signed, Fransico Him, MD  02/04/2021, 8:21 AM

## 2021-02-04 NOTE — Plan of Care (Signed)

## 2021-02-04 NOTE — TOC Transition Note (Signed)
Transition of Care [facility]) - CM/SW Discharge Note   Patient Details  Name: James Sawyer MRN: [id number] Date of Birth: [birth year]  Transition of Care [facility]) CM/SW Contact:  Bartholomew Crews, RN Phone Number: [phone] 02/04/2021, 3:48 PM   Clinical Narrative:     Acknowledging [facility] consult for medication assistance. Patient has insurance with prescription benefits. No medication assistance available through TOC.   Final next level of care: Home/Self Care Barriers to Discharge: No Barriers Identified   Patient Goals and CMS Choice        Discharge Placement                       Discharge Plan and Services                                     Social Determinants of Health (SDOH) Interventions     Readmission Risk Interventions No flowsheet data found.

## 2021-02-06 ENCOUNTER — Telehealth: Payer: Self-pay | Admitting: *Deleted

## 2021-02-06 NOTE — Telephone Encounter (Signed)
PENDING REVIEW Prior Authorization for NPSG sent to [facility] via web portal. CASE Number [id number]

## 2021-02-06 NOTE — Telephone Encounter (Signed)
-----   Message from [name], NP sent at 02/03/2021 11:08 AM EDT ----- Pt has been diagnosed with sleep apnea, but could not afford cpap - he desats in hospital and arrhythmia- I put in order for sleep study but home sleep study is fine.   I hope he can afford it. Thanks.   James Sawyer

## 2021-02-07 ENCOUNTER — Ambulatory Visit (INDEPENDENT_AMBULATORY_CARE_PROVIDER_SITE_OTHER): Payer: Medicare HMO | Admitting: Orthopedic Surgery

## 2021-02-07 ENCOUNTER — Encounter: Payer: Self-pay | Admitting: Orthopedic Surgery

## 2021-02-07 ENCOUNTER — Other Ambulatory Visit: Payer: Self-pay

## 2021-02-07 VITALS — BP 147/83 | HR 51 | Ht 70.0 in | Wt 255.0 lb

## 2021-02-07 DIAGNOSIS — M67431 Ganglion, right wrist: Secondary | ICD-10-CM
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
## 2021-02-10 NOTE — Telephone Encounter (Signed)
NOT APPROVED RECOMMEND PEER TO PEER before 02/16/21. [phone]

## 2021-02-13 ENCOUNTER — Encounter: Payer: Self-pay | Admitting: Physician Assistant
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
## 2021-02-15 ENCOUNTER — Telehealth: Payer: Self-pay | Admitting: Cardiology

## 2021-02-15 DIAGNOSIS — G4733 Obstructive sleep apnea (adult) (pediatric): Secondary | ICD-10-CM

## 2021-02-15 NOTE — Telephone Encounter (Signed)
I was in contact with Dr. Humphrey Rolls with pt's insurance, case #[id number] At phone number [phone] In lab sleep study denied.  Dr. Humphrey Rolls suggested to use sleep study from 2016 to obtain cpap and if cpap providers need a new test to resubmit Home sleep study.  But for now   Please use 2016 sleep study, he has severe sleep apnea.  Thanks. Mickel Baas

## 2021-02-16 ENCOUNTER — Other Ambulatory Visit: Payer: Self-pay

## 2021-02-16 ENCOUNTER — Encounter: Payer: Self-pay | Admitting: Physician Assistant

## 2021-02-16 ENCOUNTER — Ambulatory Visit: Payer: Medicare HMO | Admitting: Physician Assistant

## 2021-02-16 VITALS — BP 150/90 | HR 63 | Ht 71.0 in | Wt 255.0 lb

## 2021-02-16 DIAGNOSIS — E876 Hypokalemia: Secondary | ICD-10-CM | POA: Diagnosis not present

## 2021-02-16 DIAGNOSIS — R001 Bradycardia, unspecified: Secondary | ICD-10-CM | POA: Diagnosis not present

## 2021-02-16 DIAGNOSIS — G4730 Sleep apnea, unspecified: Secondary | ICD-10-CM

## 2021-02-16 DIAGNOSIS — E785 Hyperlipidemia, unspecified: Secondary | ICD-10-CM

## 2021-02-16 DIAGNOSIS — I4430 Unspecified atrioventricular block: Secondary | ICD-10-CM

## 2021-02-16 DIAGNOSIS — R000 Tachycardia, unspecified: Secondary | ICD-10-CM | POA: Diagnosis not present

## 2021-02-16 DIAGNOSIS — I10 Essential (primary) hypertension: Secondary | ICD-10-CM | POA: Diagnosis not present

## 2021-02-16 DIAGNOSIS — I2510 Atherosclerotic heart disease of native coronary artery without angina pectoris: Secondary | ICD-10-CM

## 2021-02-16 NOTE — Telephone Encounter (Signed)
Home study suggested and then apap due to being already diagnosed that he already has osa. Insurance more then likely will deny split night. Then order apap off of HST.

## 2021-02-16 NOTE — Addendum Note (Signed)
Addended by: Freada Bergeron on: 02/16/2021 09:24 AM   Modules accepted: Orders

## 2021-02-16 NOTE — Patient Instructions (Addendum)
Medication Instructions:  Your physician has recommended you make the following change in your medication:   INCREASE the Amlodipine to 2 tablets   *If you need a refill on your cardiac medications before your next appointment, please call your pharmacy*   Lab Work: TODAY:  CMET  6 WEEKS:  FASTING LIPID & CMET  If you have labs (blood work) drawn today and your tests are completely normal, you will receive your results only by: [place] (if you have MyChart) OR A paper copy in the mail If you have any lab test that is abnormal or we need to change your treatment, we will call you to review the results.   Testing/Procedures: None ordered  Your physician recommends that you schedule a follow-up appointment in: 2-3 [place]

## 2021-02-17 ENCOUNTER — Telehealth: Payer: Self-pay | Admitting: *Deleted

## 2021-02-17 DIAGNOSIS — I10 Essential (primary) hypertension: Secondary | ICD-10-CM

## 2021-02-17 DIAGNOSIS — Z79899 Other long term (current) drug therapy: Secondary | ICD-10-CM

## 2021-02-17 DIAGNOSIS — N183 Chronic kidney disease, stage 3 unspecified: Secondary | ICD-10-CM

## 2021-02-17 LAB — COMPREHENSIVE METABOLIC PANEL
ALT: 28 IU/L (ref 0–44)
AST: 22 IU/L (ref 0–40)
Albumin/Globulin Ratio: 1.8 (ref 1.2–2.2)
Albumin: 4.4 g/dL (ref 3.8–4.8)
Alkaline Phosphatase: 72 IU/L (ref 44–121)
BUN/Creatinine Ratio: 10 (ref 10–24)
BUN: 19 mg/dL (ref 8–27)
Bilirubin Total: 0.5 mg/dL (ref 0.0–1.2)
CO2: 27 mmol/L (ref 20–29)
Calcium: 9.8 mg/dL (ref 8.6–10.2)
Chloride: 103 mmol/L (ref 96–106)
Creatinine, Ser: 1.99 mg/dL — ABNORMAL HIGH (ref 0.76–1.27)
Globulin, Total: 2.5 g/dL (ref 1.5–4.5)
Glucose: 88 mg/dL (ref 70–99)
Potassium: 4.1 mmol/L (ref 3.5–5.2)
Sodium: 144 mmol/L (ref 134–144)
Total Protein: 6.9 g/dL (ref 6.0–8.5)
eGFR: 35 mL/min/{1.73_m2} — ABNORMAL LOW (ref >59)

## 2021-02-17 NOTE — Telephone Encounter (Signed)
-----   Message from James Sawyer, [place] sent at 02/17/2021 11:47 AM EST ----- Please let patient know labs show rise in kidney function. More abnormal than baseline. I discussed with Gerald Stabs, pharmD, pre-emptively. Plan: - stop indapamide and potassium for now (has potassium listed in 2 places on MAR, one listed as no longer taking, just d/c both off list) - keep plan for higher dose amlodipine like we discussed, also continue irbesartan for now - please see if he is willing to get a BP cuff for home because it would be helpful for him to keep a log. If needs assistance obtaining, please ask social work to assist - recheck BMET 1 week at which time we can check in with him about BPs if he has a log - refer to nephrology to establish care for CKD stage 3b if he has not previously seen them - keep f/u as planned

## 2021-02-17 NOTE — Addendum Note (Signed)
Addended by: Gaetano Net on: 02/17/2021 11:59 AM   Modules accepted: Orders

## 2021-02-22 NOTE — Telephone Encounter (Signed)
Home study suggested and then apap due to being already diagnosed that he already has osa. Insurance more then likely will deny split night. Then order apap off of HST.     Is this ok?   Fay Records, MD to Me   Yes    Home study ordered

## 2021-02-24 ENCOUNTER — Other Ambulatory Visit: Payer: Medicare HMO | Admitting: *Deleted

## 2021-02-24 ENCOUNTER — Other Ambulatory Visit: Payer: Self-pay

## 2021-02-24 DIAGNOSIS — Z79899 Other long term (current) drug therapy: Secondary | ICD-10-CM

## 2021-02-24 LAB — BASIC METABOLIC PANEL
BUN/Creatinine Ratio: 10 (ref 10–24)
BUN: 19 mg/dL (ref 8–27)
CO2: 28 mmol/L (ref 20–29)
Calcium: 9.9 mg/dL (ref 8.6–10.2)
Chloride: 106 mmol/L (ref 96–106)
Creatinine, Ser: 1.85 mg/dL — ABNORMAL HIGH (ref 0.76–1.27)
Glucose: 95 mg/dL (ref 70–99)
Potassium: 4.4 mmol/L (ref 3.5–5.2)
Sodium: 147 mmol/L — ABNORMAL HIGH (ref 134–144)
eGFR: 39 mL/min/{1.73_m2} — ABNORMAL LOW (ref >59)

## 2021-02-27 ENCOUNTER — Other Ambulatory Visit: Payer: Self-pay | Admitting: *Deleted

## 2021-02-27 DIAGNOSIS — I2510 Atherosclerotic heart disease of native coronary artery without angina pectoris: Secondary | ICD-10-CM

## 2021-02-27 DIAGNOSIS — I10 Essential (primary) hypertension: Secondary | ICD-10-CM

## 2021-02-27 MED ORDER — AMLODIPINE BESYLATE 5 MG PO TABS: 10.0000 mg | ORAL_TABLET | Freq: Every day | ORAL | 1 refills | Status: DC

## 2021-02-28 ENCOUNTER — Telehealth: Payer: Self-pay | Admitting: Internal Medicine

## 2021-02-28 DIAGNOSIS — I2510 Atherosclerotic heart disease of native coronary artery without angina pectoris: Secondary | ICD-10-CM

## 2021-02-28 DIAGNOSIS — E785 Hyperlipidemia, unspecified: Secondary | ICD-10-CM

## 2021-02-28 DIAGNOSIS — I10 Essential (primary) hypertension: Secondary | ICD-10-CM

## 2021-02-28 MED ORDER — EZETIMIBE 10 MG PO TABS: 10.0000 mg | ORAL_TABLET | Freq: Every day | ORAL | 11 refills | Status: DC

## 2021-02-28 MED ORDER — ATORVASTATIN CALCIUM 80 MG PO TABS: 80.0000 mg | ORAL_TABLET | Freq: Every day | ORAL | 11 refills | Status: DC

## 2021-02-28 MED ORDER — AMLODIPINE BESYLATE 5 MG PO TABS: 10.0000 mg | ORAL_TABLET | Freq: Every day | ORAL | 3 refills | Status: DC

## 2021-02-28 MED ORDER — IRBESARTAN 150 MG PO TABS: 150.0000 mg | ORAL_TABLET | Freq: Every day | ORAL | 11 refills | Status: DC

## 2021-02-28 NOTE — Telephone Encounter (Signed)
*  STAT* If patient is at the pharmacy, call can be transferred to refill team.   1. Which medications need to be refilled? (please list name of each medication and dose if known) hamLODipine (NORVASC) 5 MG tablet  2. Which pharmacy/location (including street and city if local pharmacy) is medication to be sent to? [facility] ([place]), [place] - [address]  3. Do they need a 30 day or 90 day supply? [place]

## 2021-02-28 NOTE — Telephone Encounter (Signed)
Pt's medication was sent to pt's pharmacy as requested. Confirmation received.  °

## 2021-02-28 NOTE — Telephone Encounter (Signed)
Spoke with the patient who reports that he needs refills on his cardiac medications. He reports that he would like 30 day supplies rather than 90. Refills have been sent in.

## 2021-02-28 NOTE — Telephone Encounter (Signed)
Pt c/o medication issue:  1. Name of Medication: All medications   2. How are you currently taking this medication (dosage and times per day)? N/a  3. Are you having a reaction (difficulty breathing--STAT)? N/a  4. What is your medication issue? Pt is wanting all of his medications changed over to a 90 ds

## 2021-03-06 NOTE — Progress Notes (Signed)
Patient ID: James Sawyer                 DOB: November 25, 1950                      MRN: [id number]     HPI: WRIGHT GRAVELY is a 70 y.o. male, patient of Dr. Harrington Challenger, referred by after seeing Melina Copa, PA-C on 02/16/21 to HTN clinic. PMH is significant for CAD with DES to RCA 2016 with residual mild CAD, 2:1 AVB while sleeping in 2016, bradycardia (not on BB due to this), CKD stage 3, HLD, tobacco abuse HTN, morbid obesity, severe OSA, wide-complex tachycardia, tobacco abuse, right ganglion cyst, depression.  He was recently admitted in October 2022 with DOE and mildly elevated troponins felt due to poorly controlled blood pressure. Lexiscan nuclear stress test was felt to be probably normal with thinning of the inferobasal wall but no ischemia; gated EF calculated at 36 but visually appears normal; suggested echocardiogram to better assess LV function. 2D echo showed EF 55-60%, moderate LVH, grade 1 DD, mild MR, mild atheroma of aortic root/ascending aorta. He had 7 beats WCT in complex of hypokalemia which was repleted. Patient was discharged with titration of his antihypertensives. At hospital f/u on 02/16/21, BP was 150/90 and it was discovered he had not yet increased amlodipine. Labs at that visit showed recent decline in kidney function and indapamide and potassium supplementation were discontinued.   Today, patient arrives in good spirits. Denies dizziness, headache, blurred vision, chest pain, or swelling. He feels fatigued/foggyheaded when his BP is high. He uses a wrist cuff to check his BP at home. He does not know the names of his medications but endorses stopping the two medications he was told to stop recently. He uses a pill box to manage his medications and goes by the list he gets at his visits to fill them. He has not yet taken his BP medications today because he wasn't sure what he was supposed to do before the visit. He endorses missing 1-2 days of medications per week but states that the pill  box is helpful because he can see if he has taken them for the day or not. He thinks he will be getting something in the mail about scheduling his nephrology appointment but it is not yet scheduled.   Current HTN meds: amlodipine 10 mg daily (AM), irbesartan 150 mg daily (AM) Previously tried: beta blocker (bradycardia), azilsartan-chlorthalidone (high cost), valsartan-HCTZ (switched to alternative ARB), lisinopril-HCTZ (cough with ACEi), indapamide (stopped due to decline in renal function) BP goal: <130/80 mmHg  Family History: Patient was adopted  Social History: Every day smoker 0.25 packs per day (10 pack-years)  Diet: chicken/turkey, vegetables every now and then, seasons food with lemon pepper instead of salt  Exercise: walks 10-15 minutes every other day  Home BP readings: using wrist cuff (inaccurate technique - not resting wrist across chest) - 150-190s/130s  Labs:  -02/24/21: Scr 1.85, K 4.4, Na 147 (irbesartan 150 mg; stopped indapamide & Kcl 1 week prior) -02/16/21: Scr 1.99, K 4.1, Na 144 (irbesartan 150 mg, indapamide 1.25 mg daily, Kcl 20 mEq daily) -02/04/21: Scr 1.48, K 3.4, Na 139, Mg 2.2 (Kcl 20 mEq daily)  Wt Readings from Last 3 Encounters:  02/16/21 255 lb (115.7 kg)  02/07/21 255 lb (115.7 kg)  02/02/21 260 lb 11.2 oz (118.3 kg)   BP Readings from Last 3 Encounters:  02/16/21 (!) 150/90  02/07/21 Marland Kitchen)  147/83  02/04/21 (!) 164/94   Pulse Readings from Last 3 Encounters:  02/16/21 63  02/07/21 (!) 51  02/04/21 70    Renal function: CrCl cannot be calculated (Unknown ideal weight.).  Past Medical History:  Diagnosis Date   AV block    a. prior 2:1 AVB during 2016 admission felt 2/2 sleep apnea.   Bradycardia    CAD (coronary artery disease)    a. 10/2014 MV: inflat ischemia, EF 46%;  b. 10/2014 Echo: EF 60-65%, apical AK, Gr2 DD;  c. 10/2014 Cath/PCI: LM nl, LAD min irregs, LCX 23m, OM1/2/3 min irregs, RCA 30p, 90p (4.0x15 Resolute Integrity DES).    COPD (chronic obstructive pulmonary disease) ([place])    Depression    Essential hypertension 09/29/2009   Hyperlipidemia    LIBIDO, DECREASED 11/14/2009   Morbid obesity ([place])    OSA (obstructive sleep apnea) 03/09/2015   Severe with AHI 100/hr   Sleep apnea    no cpap   Thrombocytopenia ([place]) 03/30/2011   'was getting tx'd at the cancer center til lost my job 06/2010"   Tobacco abuse    Wide-complex tachycardia    a. brief during 01/2021 admission.    Current Outpatient Medications on File Prior to Visit  Medication Sig Dispense Refill   amLODipine (NORVASC) 5 MG tablet Take 2 tablets (10 mg total) by mouth daily. 180 tablet 3   aspirin 81 MG tablet Take 1 tablet (81 mg total) by mouth daily. 30 tablet 1   atorvastatin (LIPITOR) 80 MG tablet Take 1 tablet (80 mg total) by mouth daily. 30 tablet 11   buPROPion (WELLBUTRIN XL) 150 MG 24 hr tablet Take 1 tablet (150 mg total) by mouth daily. 30 tablet 0   Cholecalciferol (OPTIMAL-D) 1.25 MG (50000 UT) capsule Take 1 capsule (50,000 Units total) by mouth once a week. 4 capsule 1   cyanocobalamin 2000 MCG tablet Take 1 tablet (2,000 mcg total) by mouth daily. 30 tablet 1   DULoxetine (CYMBALTA) 60 MG capsule Take 1 capsule (60 mg total) by mouth daily. 30 capsule 0   ezetimibe (ZETIA) 10 MG tablet Take 1 tablet (10 mg total) by mouth daily. 30 tablet 11   irbesartan (AVAPRO) 150 MG tablet Take 1 tablet (150 mg total) by mouth daily. 30 tablet 11   nicotine (NICODERM CQ - DOSED IN MG/24 HOURS) 14 mg/24hr patch Place 1 patch (14 mg total) onto the skin daily. (Patient not taking: Reported on 02/16/2021) 28 patch 0   thiamine (VITAMIN B-1) 100 MG tablet Take 1 tablet (100 mg total) by mouth daily. 30 tablet 1   [DISCONTINUED] testosterone cypionate (DEPOTESTOTERONE CYPIONATE) 200 MG/ML injection Inject 200 mg into the muscle every 28 days.       No current facility-administered medications on file prior to visit.    No Known  Allergies   Assessment/Plan:  1. Hypertension - BP in clinic of 150/92 not at goal <130/80 mmHg. Even though he has not taken his BP medications yet today, BP has been consistently elevated at recent visits and indapamide was stopped recently due to worsening renal function, so it is reasonable to add another BP agent today. Will start hydralazine 12.5mg  TID. Continue amlodipine 10mg  daily and irbesartan 150mg  daily. He will continue to monitor his BP at home. Counseled him on the correct wrist cuff technique and he confirms understanding. Will check a BMET today to see if Scr is continuing to trend down since stopping indapamide 02/17/21. He has not yet  been scheduled with nephrology so we will see him back in HTN clinic 03/28/21 to coordinate with his fasting labs that day. He will bring in his BP cuff to that visit and has been counseled to take his BP medications that morning. Counseled on importance of medication adherence.    Rebbeca Paul, PharmD PGY2 Ambulatory Care Pharmacy Resident 03/07/2021 2:03 PM

## 2021-03-07 ENCOUNTER — Other Ambulatory Visit: Payer: Self-pay

## 2021-03-07 ENCOUNTER — Ambulatory Visit: Payer: Medicare HMO | Admitting: Student-PharmD

## 2021-03-07 VITALS — BP 150/92 | HR 77

## 2021-03-07 DIAGNOSIS — I10 Essential (primary) hypertension: Secondary | ICD-10-CM

## 2021-03-07 MED ORDER — HYDRALAZINE HCL 25 MG PO TABS: 12.5000 mg | ORAL_TABLET | Freq: Three times a day (TID) | ORAL | 11 refills | Status: DC

## 2021-03-07 NOTE — Patient Instructions (Signed)
It was nice to see you today!  Your goal blood pressure is less than 130/80 mmHg. In clinic, your blood pressure was 150/92 mmHg.  Medication Changes: Begin hydralazine 12.5 mg (half of a tablet) three times daily  Continue amlodipine 10 mg (two tablets) daily and irbesartan 150 mg daily.   Don't forget to lay your wrist across your chest to check your blood pressure. Monitor blood pressure at home daily and keep a log (on your phone or piece of paper) to bring with you to your next visit. Write down date, time, blood pressure and pulse.   Keep up the good work with diet and exercise. Aim for a diet full of vegetables, fruit and lean meats (chicken, [place], fish). Try to limit salt intake by eating fresh or frozen vegetables (instead of canned), rinse canned vegetables prior to cooking and do not add any additional salt to meals.   For next visit on 12/20 at 10:30am:  Take your blood pressure medications that morning Do not eat that morning - we are checking fasting labs Please bring your blood pressure machine to the visit so we can check it for accuracy

## 2021-03-08 LAB — BASIC METABOLIC PANEL
BUN/Creatinine Ratio: 12 (ref 10–24)
BUN: 24 mg/dL (ref 8–27)
CO2: 23 mmol/L (ref 20–29)
Calcium: 9.5 mg/dL (ref 8.6–10.2)
Chloride: 105 mmol/L (ref 96–106)
Creatinine, Ser: 1.94 mg/dL — ABNORMAL HIGH (ref 0.76–1.27)
Glucose: 108 mg/dL — ABNORMAL HIGH (ref 70–99)
Potassium: 4 mmol/L (ref 3.5–5.2)
Sodium: 146 mmol/L — ABNORMAL HIGH (ref 134–144)
eGFR: 37 mL/min/{1.73_m2} — ABNORMAL LOW (ref >59)

## 2021-03-12 ENCOUNTER — Other Ambulatory Visit: Payer: Self-pay | Admitting: *Deleted

## 2021-03-12 DIAGNOSIS — F17210 Nicotine dependence, cigarettes, uncomplicated: Secondary | ICD-10-CM

## 2021-03-12 DIAGNOSIS — Z87891 Personal history of nicotine dependence: Secondary | ICD-10-CM

## 2021-03-27 ENCOUNTER — Encounter: Payer: Self-pay | Admitting: Acute Care

## 2021-03-27 ENCOUNTER — Other Ambulatory Visit: Payer: Medicare HMO

## 2021-03-27 NOTE — Progress Notes (Signed)
Patient ID: James Sawyer                 DOB: [birth year]                      MRN: [id number]    HPI: JIA James Sawyer is a 70 y.o. male, patient of Dr. Harrington Challenger, referred by after seeing Melina Copa, PA-C on 02/16/21 to HTN clinic. PMH is significant for CAD with DES to RCA 2016 with residual mild CAD, 2:1 AVB while sleeping in 2016, bradycardia (not on BB due to this), CKD stage 3, HLD, tobacco abuse HTN, morbid obesity, severe OSA, wide-complex tachycardia, tobacco abuse, right ganglion cyst, depression.  He was recently admitted in October 2022 with DOE and mildly elevated troponins felt due to poorly controlled blood pressure. Lexiscan nuclear stress test was felt to be probably normal with thinning of the inferobasal wall but no ischemia; gated EF calculated at 36 but visually appears normal; suggested echocardiogram to better assess LV function. 2D echo showed EF 55-60%, moderate LVH, grade 1 DD, mild MR, mild atheroma of aortic root/ascending aorta. He had 7 beats WCT in complex of hypokalemia which was repleted. Patient was discharged with titration of his antihypertensives. At hospital f/u on 02/16/21, BP was 150/90 and it was discovered he had not yet increased amlodipine. Labs at that visit showed recent decline in kidney function and indapamide and potassium supplementation were discontinued. At last visit with HTN clinic 03/07/21, hydralazine was started and found on BMET that Scr had increased slightly despite stopping indapamide >2 weeks prior.   Today, patient arrives in good spirits. Reports feeling slightly less tired since changing his medications after our last visit. He has taken all of his medications this morning and is fasting for his lab work. He is taking hydralazine 50 mg daily instead of 12.5 mg TID. Denies dizziness, headache, blurred vision, chest pain, or swelling. He brings his BP cuff and log of daily BP readings. Home cuff read 145/86, HR 74. Clinic cuff read 150/92 in R arm, 150/88  in L arm, HR 72. Also brings bag of medications that he is taking and a bag of medications he is not taking and asks if I can dispose of the medications he is not taking for him. Confirmed he is not taking any NSAIDs over the counter. He is still not scheduled with nephrology.   Current HTN meds: amlodipine 10 mg daily (AM), irbesartan 150 mg daily (AM), hydralazine 12.5 mg TID (patient is taking 50 mg daily in the AM instead) Previously tried: beta blocker (bradycardia), azilsartan-chlorthalidone (high cost), valsartan-HCTZ (switched to alternative ARB), lisinopril-HCTZ (cough with ACEi), indapamide (stopped due to decline in renal function) BP goal: <130/80 mmHg  Family History: Patient was adopted  Social History: Every day smoker 0.25 packs per day (10 pack-years)  Diet: chicken/turkey, vegetables every now and then, seasons food with lemon pepper instead of salt  Exercise: walks 10-15 minutes every other day  Home BP readings: Using wrist cuff (now with corrected technique after last visit). BP has improved overall since using correct technique but it is very variable between days and between arms. Varies whether R or L arm is higher but difference is sometimes 50 mmHg higher, but other times is very similar. Recent readings: 143/83, 148/87, 142/90, 176/107, 173/111, 142/85, 132/75, 125/78, 169/110, 111/79, 127/73, 203/117, 179/100, 137/82  Labs:  -03/07/21: Scr 1.94, Na 146, K 4.0 (irbesartan 150 mg) -02/24/21: Scr 1.85, K 4.4,  Na 147 (irbesartan 150 mg; stopped indapamide & Kcl 1 week prior) -02/16/21: Scr 1.99, K 4.1, Na 144 (irbesartan 150 mg, indapamide 1.25 mg daily, Kcl 20 mEq daily) -02/04/21: Scr 1.48, K 3.4, Na 139, Mg 2.2 (Kcl 20 mEq daily)  Wt Readings from Last 3 Encounters:  02/16/21 255 lb (115.7 kg)  02/07/21 255 lb (115.7 kg)  02/02/21 260 lb 11.2 oz (118.3 kg)   BP Readings from Last 3 Encounters:  03/07/21 (!) 150/92  02/16/21 (!) 150/90  02/07/21 (!) 147/83    Pulse Readings from Last 3 Encounters:  03/07/21 77  02/16/21 63  02/07/21 (!) 51    Renal function: CrCl cannot be calculated (Unknown ideal weight.).  Past Medical History:  Diagnosis Date   AV block    a. prior 2:1 AVB during 2016 admission felt 2/2 sleep apnea.   Bradycardia    CAD (coronary artery disease)    a. 10/2014 MV: inflat ischemia, EF 46%;  b. 10/2014 Echo: EF 60-65%, apical AK, Gr2 DD;  c. 10/2014 Cath/PCI: LM nl, LAD min irregs, LCX 8m, OM1/2/3 min irregs, RCA 30p, 90p (4.0x15 Resolute Integrity DES).   COPD (chronic obstructive pulmonary disease) ([place])    Depression    Essential hypertension 09/29/2009   Hyperlipidemia    LIBIDO, DECREASED 11/14/2009   Morbid obesity ([place])    OSA (obstructive sleep apnea) 03/09/2015   Severe with AHI 100/hr   Sleep apnea    no cpap   Thrombocytopenia ([place]) 03/30/2011   'was getting tx'd at the cancer center til lost my job 06/2010"   Tobacco abuse    Wide-complex tachycardia    a. brief during 01/2021 admission.    Current Outpatient Medications on File Prior to Visit  Medication Sig Dispense Refill   amLODipine (NORVASC) 5 MG tablet Take 2 tablets (10 mg total) by mouth daily. 180 tablet 3   aspirin 81 MG tablet Take 1 tablet (81 mg total) by mouth daily. 30 tablet 1   atorvastatin (LIPITOR) 80 MG tablet Take 1 tablet (80 mg total) by mouth daily. 30 tablet 11   buPROPion (WELLBUTRIN XL) 150 MG 24 hr tablet Take 1 tablet (150 mg total) by mouth daily. 30 tablet 0   Cholecalciferol (OPTIMAL-D) 1.25 MG (50000 UT) capsule Take 1 capsule (50,000 Units total) by mouth once a week. 4 capsule 1   cyanocobalamin 2000 MCG tablet Take 1 tablet (2,000 mcg total) by mouth daily. 30 tablet 1   DULoxetine (CYMBALTA) 60 MG capsule Take 1 capsule (60 mg total) by mouth daily. 30 capsule 0   ezetimibe (ZETIA) 10 MG tablet Take 1 tablet (10 mg total) by mouth daily. 30 tablet 11   hydrALAZINE (APRESOLINE) 25 MG tablet Take 0.5 tablets  (12.5 mg total) by mouth 3 (three) times daily. 45 tablet 11   irbesartan (AVAPRO) 150 MG tablet Take 1 tablet (150 mg total) by mouth daily. 30 tablet 11   nicotine (NICODERM CQ - DOSED IN MG/24 HOURS) 14 mg/24hr patch Place 1 patch (14 mg total) onto the skin daily. (Patient not taking: Reported on 02/16/2021) 28 patch 0   thiamine (VITAMIN B-1) 100 MG tablet Take 1 tablet (100 mg total) by mouth daily. 30 tablet 1   [DISCONTINUED] testosterone cypionate (DEPOTESTOTERONE CYPIONATE) 200 MG/ML injection Inject 200 mg into the muscle every 28 days.       No current facility-administered medications on file prior to visit.    No Known Allergies   Assessment/Plan:  1.  Hypertension - BP in clinic today not at goal <130/80 mmHg. Home cuff does read accurately compared to clinic cuff when correct wrist cuff technique is used. Checked BP in both arms given variability in overall readings day to day and between arms at home but BP was similar in both arms in clinic. Given Scr did not improve with last BMET, will decrease irbesartan to 75 mg daily. He has been taking hydralazine incorrectly - 50 mg daily instead of 12.5 mg TID as prescribed. Since he has taken 50 mg of hydralazine today in addition to his other medications and BP still elevated (and since we are decreasing his irbesartan today), will increase hydralazine to 50 mg TID. Suspect that significant variability in home readings is because he is only taking this once daily. Explained importance of taking this TID as prescribed and he confirmed understanding. Instructed him to take his medications before his next visit and bring all medications to that visit as well. I asked him to bring in his log of BP readings to his next visit but he said he did not want to keep a log anymore but someone else uses his BP cuff too so it would be difficult to tell whose is who if he brought that in for readings. I asked him to try to write down readings for 1 week  prior to his next visit. I encouraged him to reach out about scheduling with nephrology since he hasn't heard from them about scheduling after getting a letter in the mail. I took him to lab after our visit as he is due for a lipid panel and CMET that Dayna previously ordered.   Follow up with HTN clinic scheduled for 04/25/21.    Rebbeca Paul, PharmD PGY2 Ambulatory Care Pharmacy Resident 03/27/2021 6:04 PM

## 2021-03-28 ENCOUNTER — Ambulatory Visit: Payer: Medicare HMO | Admitting: Student-PharmD

## 2021-03-28 ENCOUNTER — Other Ambulatory Visit: Payer: Self-pay

## 2021-03-28 ENCOUNTER — Other Ambulatory Visit: Payer: Medicare HMO

## 2021-03-28 DIAGNOSIS — I4430 Unspecified atrioventricular block: Secondary | ICD-10-CM | POA: Diagnosis not present

## 2021-03-28 DIAGNOSIS — R001 Bradycardia, unspecified: Secondary | ICD-10-CM

## 2021-03-28 DIAGNOSIS — I2510 Atherosclerotic heart disease of native coronary artery without angina pectoris: Secondary | ICD-10-CM | POA: Diagnosis not present

## 2021-03-28 DIAGNOSIS — E785 Hyperlipidemia, unspecified: Secondary | ICD-10-CM | POA: Diagnosis not present

## 2021-03-28 DIAGNOSIS — G4730 Sleep apnea, unspecified: Secondary | ICD-10-CM

## 2021-03-28 DIAGNOSIS — R000 Tachycardia, unspecified: Secondary | ICD-10-CM

## 2021-03-28 DIAGNOSIS — I10 Essential (primary) hypertension: Secondary | ICD-10-CM

## 2021-03-28 MED ORDER — IRBESARTAN 75 MG PO TABS: 75.0000 mg | ORAL_TABLET | Freq: Every day | ORAL | 11 refills | Status: DC

## 2021-03-28 MED ORDER — HYDRALAZINE HCL 50 MG PO TABS: 50.0000 mg | ORAL_TABLET | Freq: Three times a day (TID) | ORAL | 11 refills | Status: DC

## 2021-03-28 NOTE — Patient Instructions (Addendum)
°  It was nice to see you today!  Your goal blood pressure is less than 130/80 mmHg. In clinic, your blood pressure was 150/92 mmHg.  Medication Changes:  DECREASE irbesartan to 75 mg daily. You can take half of your current tablets until you pick up the new prescription. Then you take one whole tablet once daily.   INCREASE hydralazine to 50 mg three times daily. You can take 2 of your current tablets until you pick up the new prescription. Then you will take one tablet three times daily. It is important to take it three times daily since it only lasts about 8 hours.   Continue amlodipine 10 mg daily.   Monitor blood pressure at home daily and keep a log (on your phone or piece of paper) to bring with you to your next visit. Write down date, time, blood pressure and pulse. Please try to do this for the 1 week prior to your appt.   Keep up the good work with diet and exercise. Aim for a diet full of vegetables, fruit and lean meats (chicken, [place], fish). Try to limit salt intake by eating fresh or frozen vegetables (instead of canned), rinse canned vegetables prior to cooking and do not add any additional salt to meals.   Please take your medicines before your next appointment and bring them and your BP log with you.

## 2021-03-29 LAB — COMPREHENSIVE METABOLIC PANEL
ALT: 45 IU/L — ABNORMAL HIGH (ref 0–44)
AST: 29 IU/L (ref 0–40)
Albumin/Globulin Ratio: 1.4 (ref 1.2–2.2)
Albumin: 4 g/dL (ref 3.8–4.8)
Alkaline Phosphatase: 74 IU/L (ref 44–121)
BUN/Creatinine Ratio: 7 — ABNORMAL LOW (ref 10–24)
BUN: 10 mg/dL (ref 8–27)
Bilirubin Total: <0.2 mg/dL (ref 0.0–1.2)
CO2: 27 mmol/L (ref 20–29)
Calcium: 9.5 mg/dL (ref 8.6–10.2)
Chloride: 112 mmol/L — ABNORMAL HIGH (ref 96–106)
Creatinine, Ser: 1.46 mg/dL — ABNORMAL HIGH (ref 0.76–1.27)
Globulin, Total: 2.8 g/dL (ref 1.5–4.5)
Glucose: 104 mg/dL — ABNORMAL HIGH (ref 70–99)
Potassium: 4.3 mmol/L (ref 3.5–5.2)
Sodium: 146 mmol/L — ABNORMAL HIGH (ref 134–144)
Total Protein: 6.8 g/dL (ref 6.0–8.5)
eGFR: 51 mL/min/{1.73_m2} — ABNORMAL LOW (ref >59)

## 2021-03-29 LAB — LIPID PANEL
Chol/HDL Ratio: 2.5 ratio (ref 0.0–5.0)
Cholesterol, Total: 144 mg/dL (ref 100–199)
HDL: 58 mg/dL (ref >39)
LDL Chol Calc (NIH): 51 mg/dL (ref 0–99)
Triglycerides: 220 mg/dL — ABNORMAL HIGH (ref 0–149)
VLDL Cholesterol Cal: 35 mg/dL (ref 5–40)

## 2021-04-04 ENCOUNTER — Other Ambulatory Visit: Payer: Self-pay

## 2021-04-04 ENCOUNTER — Ambulatory Visit (HOSPITAL_BASED_OUTPATIENT_CLINIC_OR_DEPARTMENT_OTHER): Payer: Medicare HMO | Attending: Internal Medicine | Admitting: Cardiology

## 2021-04-04 DIAGNOSIS — G4736 Sleep related hypoventilation in conditions classified elsewhere: Secondary | ICD-10-CM | POA: Insufficient documentation

## 2021-04-04 DIAGNOSIS — G4734 Idiopathic sleep related nonobstructive alveolar hypoventilation: Secondary | ICD-10-CM

## 2021-04-04 DIAGNOSIS — G4733 Obstructive sleep apnea (adult) (pediatric): Secondary | ICD-10-CM

## 2021-04-06 NOTE — Telephone Encounter (Signed)
Informed patient of upcoming home sleep study and patient understanding was verbalized.  Patient understands her/his HST is scheduled for 04-04-2021 at 11. Pt is aware of testing date.

## 2021-04-11 NOTE — Procedures (Signed)
° °  Patient Name: James Sawyer, Schauer Date: 04/05/2021 Gender: Male D.O.B: 1950-09-05 Age (years): 3 Referring Provider: Dorris Carnes Height (inches): 25 Interpreting Physician: Fransico Him MD, ABSM Weight (lbs): 255 RPSGT: Jacolyn Reedy BMI: 36 MRN: [id number] Neck Size: 19.00  CLINICAL INFORMATION Sleep Study Type: HST  Indication for sleep study: N/A  Epworth Sleepiness Score: N/A  SLEEP STUDY TECHNIQUE A multi-channel overnight portable sleep study was performed. The channels recorded were: nasal airflow, thoracic respiratory movement, and oxygen saturation with a pulse oximetry. Snoring was also monitored.  MEDICATIONS Patient self administered medications include: N/A.  SLEEP ARCHITECTURE Patient was studied for 359.5 minutes. The sleep efficiency was 100.0 % and the patient was supine for 8.7%. The arousal index was 0.0 per hour.  RESPIRATORY PARAMETERS The overall AHI was 30.2 per hour, with a central apnea index of 0 per hour.  The oxygen nadir was 76% during sleep.  CARDIAC DATA Mean heart rate during sleep was 64.1 bpm.  IMPRESSIONS - Severe obstructive sleep apnea occurred during this study (AHI = 30.2/h). - Severe oxygen desaturation was noted during this study (Min O2 = 76%). - Patient snored 26.3% during the sleep.  DIAGNOSIS - Obstructive Sleep Apnea (G47.33) - Nocturnal Hypoxemia (G47.36)  RECOMMENDATIONS - Recommend in lab CPAP titration - Positional therapy avoiding supine position during sleep. - Avoid alcohol, sedatives and other CNS depressants that may worsen sleep apnea and disrupt normal sleep architecture. - Sleep hygiene should be reviewed to assess factors that may improve sleep quality. - Weight management and regular exercise should be initiated or continued.  [Electronically signed] 04/11/2021 12:56 PM  Fransico Him MD, ABSM Diplomate, American Board of Sleep Medicine

## 2021-04-19 ENCOUNTER — Telehealth: Payer: Self-pay | Admitting: *Deleted

## 2021-04-19 DIAGNOSIS — Z79899 Other long term (current) drug therapy: Secondary | ICD-10-CM

## 2021-04-19 NOTE — Telephone Encounter (Signed)
-----   Message from Loel Dubonnet, NP sent at 03/29/2021  7:53 AM EST ----- One of liver enzymes very mildly elevated. Recommend avoidance of fried foods, alcohol, Tylenol. Stable kidney function compared to previous, proceed with plan to establish with nephrology. Lipid panel shows LDL (bad cholesterol) at goal. Triglycerides elevated, ensure avoiding sweets and carbs.   Recommend repeat LFT in 1 month for monitoring. Could be collected at 04/25/21 visit with pharmacist. If LFT still elevated at that time will need to consider adjusting dose of statin.

## 2021-04-24 NOTE — Progress Notes (Signed)
Patient ID: James Sawyer                 DOB: 04/16/1950                      MRN: [id number]    HPI: James Sawyer is a 71 y.o. male, patient of Dr. Harrington Challenger, referred by after seeing Melina Copa, PA-C on 02/16/21 to HTN clinic. PMH is significant for CAD with DES to RCA 2016 with residual mild CAD, 2:1 AVB while sleeping in 2016, bradycardia (not on BB due to this), CKD stage 3, HLD, tobacco abuse HTN, morbid obesity, severe OSA, wide-complex tachycardia, tobacco abuse, right ganglion cyst, depression.  He was recently admitted in October 2022 with DOE and mildly elevated troponins felt due to poorly controlled blood pressure. Lexiscan nuclear stress test was felt to be probably normal with thinning of the inferobasal wall but no ischemia; gated EF calculated at 36 but visually appears normal; suggested echocardiogram to better assess LV function. 2D echo showed EF 55-60%, moderate LVH, grade 1 DD, mild MR, mild atheroma of aortic root/ascending aorta. He had 7 beats WCT in complex of hypokalemia which was repleted. Patient was discharged with titration of his antihypertensives. At hospital f/u on 02/16/21, BP was 150/90 and it was discovered he had not yet increased amlodipine. Labs at that visit showed recent decline in kidney function and indapamide and potassium supplementation were discontinued. Seen now by HTN clinic twice. At last visit 03/28/21, patient was not taking hydralazine as instructed. Due to renal function irbesartan was decreased, and hydralazine was increased with importance of TID dosing emphasized.   Today, patient arrives after having his labs drawn to recheck LFTs. He appears very sleepy and confused throughout the visit but tells me he has been having more energy. He is going to talk with his PCP at his next visit in a couple weeks about how he is sleepy throughout the day and doesn't sleep at night. He has not taken his BP medications today because he hasn't eaten yet. He uses a pill  box to organize his medications but he is not sure the names or doses of the medications he is taking. He does not seem to remember the changes made at the last visit so it is unclear if he has made these changes. He is not taking hydralazine three times daily as advised at last visit. He states he is taking it once daily but is not clear whether he is taking multiple tablets at once time. He is not checking his BP at home because he says someone else is using his cuff but he has been feeling good. Denies headaches, dizziness, blurred vision, swelling.   Current HTN meds: amlodipine 10 mg daily (AM), irbesartan 75 mg daily (AM), hydralazine 50 mg TID Previously tried: beta blocker (bradycardia), azilsartan-chlorthalidone (high cost), valsartan-HCTZ (switched to alternative ARB), lisinopril-HCTZ (cough with ACEi), indapamide (stopped due to decline in renal function) BP goal: <130/80 mmHg  Family History: Patient was adopted  Social History: Every day smoker 0.25 packs per day (10 pack-years)  Diet: Chicken/turkey, vegetables every now and then, seasons food with lemon pepper instead of salt  Exercise: walks 10-15 minutes every other day  Home BP readings: Wrist cuff previously validated as accurate with clinic cuff and technique was corrected. Is not checking currently.   Labs:  -03/28/21: Scr 1.46, Na 146, K 4.3 (irbesartan 150 mg), LDL 51 (atorvastatin 80 mg, ezetimibe 10 mg) -  03/07/21: Scr 1.94, Na 146, K 4.0 (irbesartan 150 mg) -02/24/21: Scr 1.85, K 4.4, Na 147 (irbesartan 150 mg; stopped indapamide & Kcl 1 week prior) -02/16/21: Scr 1.99, K 4.1, Na 144 (irbesartan 150 mg, indapamide 1.25 mg daily, Kcl 20 mEq daily) -02/04/21: Scr 1.48, K 3.4, Na 139, Mg 2.2 (Kcl 20 mEq daily)  Wt Readings from Last 3 Encounters:  04/04/21 255 lb (115.7 kg)  02/16/21 255 lb (115.7 kg)  02/07/21 255 lb (115.7 kg)   BP Readings from Last 3 Encounters:  04/25/21 (!) 158/98  03/28/21 (!) 150/92   03/07/21 (!) 150/92   Pulse Readings from Last 3 Encounters:  04/25/21 67  03/28/21 72  03/07/21 77    Renal function: CrCl cannot be calculated (Patient's most recent lab result is older than the maximum 21 days allowed.).  Past Medical History:  Diagnosis Date   AV block    a. prior 2:1 AVB during 2016 admission felt 2/2 sleep apnea.   Bradycardia    CAD (coronary artery disease)    a. 10/2014 MV: inflat ischemia, EF 46%;  b. 10/2014 Echo: EF 60-65%, apical AK, Gr2 DD;  c. 10/2014 Cath/PCI: LM nl, LAD min irregs, LCX 12m, OM1/2/3 min irregs, RCA 30p, 90p (4.0x15 Resolute Integrity DES).   COPD (chronic obstructive pulmonary disease) ([place])    Depression    Essential hypertension 09/29/2009   Hyperlipidemia    LIBIDO, DECREASED 11/14/2009   Morbid obesity ([place])    OSA (obstructive sleep apnea) 03/09/2015   Severe with AHI 100/hr   Sleep apnea    no cpap   Thrombocytopenia ([place]) 03/30/2011   'was getting tx'd at the cancer center til lost my job 06/2010"   Tobacco abuse    Wide-complex tachycardia    a. brief during 01/2021 admission.    Current Outpatient Medications on File Prior to Visit  Medication Sig Dispense Refill   atorvastatin (LIPITOR) 80 MG tablet Take 1 tablet (80 mg total) by mouth daily. 30 tablet 11   buPROPion (WELLBUTRIN XL) 150 MG 24 hr tablet Take 1 tablet (150 mg total) by mouth daily. 30 tablet 0   DULoxetine (CYMBALTA) 60 MG capsule Take 1 capsule (60 mg total) by mouth daily. 30 capsule 0   ezetimibe (ZETIA) 10 MG tablet Take 1 tablet (10 mg total) by mouth daily. 30 tablet 11   nicotine (NICODERM CQ - DOSED IN MG/24 HOURS) 14 mg/24hr patch Place 1 patch (14 mg total) onto the skin daily. (Patient not taking: Reported on 02/16/2021) 28 patch 0   [DISCONTINUED] testosterone cypionate (DEPOTESTOTERONE CYPIONATE) 200 MG/ML injection Inject 200 mg into the muscle every 28 days.       No current facility-administered medications on file prior to visit.     No Known Allergies  Assessment/Plan:  1. Hypertension - BP in clinic today not at goal <130/80 mmHg and slightly higher than last visit secondary to patient not correctly taking medication as prescribed. Unable to tell what he is taking given his confusion. He is not willing to take hydralazine three times daily but is willing to try taking it twice daily. Will adjust hydralazine to 50 mg BID and continue irbesartan 75 mg daily and amlodipine 10 mg daily. He confirmed understanding of this regimen and was provided with a list of the correct medications. Follow up office visit in 4 weeks. Patient instructed to bring all of his medications to this visit so I can confirm what he is taking.   [employer]  Lorin Mercy, PharmD PGY2 Ambulatory Care Pharmacy Resident 04/25/2021 1:59 PM

## 2021-04-25 ENCOUNTER — Ambulatory Visit: Payer: Medicare HMO | Admitting: Student-PharmD

## 2021-04-25 ENCOUNTER — Other Ambulatory Visit: Payer: Self-pay

## 2021-04-25 ENCOUNTER — Other Ambulatory Visit: Payer: Medicare HMO | Admitting: *Deleted

## 2021-04-25 DIAGNOSIS — I10 Essential (primary) hypertension: Secondary | ICD-10-CM

## 2021-04-25 DIAGNOSIS — I2510 Atherosclerotic heart disease of native coronary artery without angina pectoris: Secondary | ICD-10-CM

## 2021-04-25 DIAGNOSIS — Z79899 Other long term (current) drug therapy: Secondary | ICD-10-CM | POA: Diagnosis not present

## 2021-04-25 LAB — HEPATIC FUNCTION PANEL
ALT: 22 IU/L (ref 0–44)
AST: 23 IU/L (ref 0–40)
Albumin: 4.5 g/dL (ref 3.8–4.8)
Alkaline Phosphatase: 70 IU/L (ref 44–121)
Bilirubin Total: 0.2 mg/dL (ref 0.0–1.2)
Bilirubin, Direct: <0.1 mg/dL (ref 0.00–0.40)
Total Protein: 7.1 g/dL (ref 6.0–8.5)

## 2021-04-25 MED ORDER — AMLODIPINE BESYLATE 10 MG PO TABS: 10.0000 mg | ORAL_TABLET | Freq: Every day | ORAL | 11 refills | Status: DC

## 2021-04-25 MED ORDER — IRBESARTAN 75 MG PO TABS: 75.0000 mg | ORAL_TABLET | Freq: Every day | ORAL | 11 refills | Status: DC

## 2021-04-25 MED ORDER — HYDRALAZINE HCL 50 MG PO TABS: 50.0000 mg | ORAL_TABLET | Freq: Two times a day (BID) | ORAL | 11 refills | Status: DC

## 2021-04-25 NOTE — Patient Instructions (Addendum)
°  Blood pressure medications:  Irbesartan 75 mg (1 tablet) daily in the morning Amlodipine 10 mg (1 tablet) daily in the morning Hydralazine 50 mg (1 tablet) TWICE daily in the morning and evening  Cholesterol medications:  Atorvastatin 80 mg (1 tablet) daily in the morning Ezetimibe 10 mg (1 tablet) daily in the morning  Next visit on February 14th at 1:30pm. Bring ALL your medications with you to the next visit.

## 2021-04-26 NOTE — Progress Notes (Signed)
Pt has been made aware of normal result and verbalized understanding.  jw

## 2021-05-02 ENCOUNTER — Telehealth: Payer: Self-pay | Admitting: *Deleted

## 2021-05-02 DIAGNOSIS — G4733 Obstructive sleep apnea (adult) (pediatric): Secondary | ICD-10-CM

## 2021-05-02 NOTE — Telephone Encounter (Signed)
The patient has been notified of the result and verbalized understanding.  All questions (if any) were answered. James Sawyer, [place] 8/37/7939 6:88 PM    Precert titration

## 2021-05-02 NOTE — Telephone Encounter (Signed)
-----   Message from Sueanne Margarita, MD sent at 04/11/2021  1:02 PM EST ----- Please let patient know that they have sleep apnea.  Recommend therapeutic CPAP titration for treatment of patient's sleep disordered breathing.  If unable to perform an in lab titration then initiate ResMed auto CPAP from 4 to 15cm H2O with heated humidity and mask of choice and overnight pulse ox on CPAP.

## 2021-05-09 ENCOUNTER — Encounter: Payer: Self-pay | Admitting: Internal Medicine

## 2021-05-09 ENCOUNTER — Ambulatory Visit (INDEPENDENT_AMBULATORY_CARE_PROVIDER_SITE_OTHER): Payer: Medicare HMO | Admitting: Internal Medicine

## 2021-05-09 ENCOUNTER — Other Ambulatory Visit: Payer: Self-pay

## 2021-05-09 VITALS — BP 128/78 | HR 78 | Temp 98.4°F | Resp 16 | Ht 71.0 in | Wt 256.2 lb

## 2021-05-09 DIAGNOSIS — F5104 Psychophysiologic insomnia: Secondary | ICD-10-CM

## 2021-05-09 DIAGNOSIS — N1831 Chronic kidney disease, stage 3a: Secondary | ICD-10-CM

## 2021-05-09 DIAGNOSIS — R69 Illness, unspecified: Secondary | ICD-10-CM | POA: Diagnosis not present

## 2021-05-09 DIAGNOSIS — R7303 Prediabetes: Secondary | ICD-10-CM | POA: Diagnosis not present

## 2021-05-09 DIAGNOSIS — I10 Essential (primary) hypertension: Secondary | ICD-10-CM

## 2021-05-09 DIAGNOSIS — Z23 Encounter for immunization: Secondary | ICD-10-CM

## 2021-05-09 LAB — HEMOGLOBIN A1C: Hgb A1c MFr Bld: 6.2 % (ref 4.6–6.5)

## 2021-05-09 LAB — BASIC METABOLIC PANEL
BUN: 23 mg/dL (ref 6–23)
CO2: 28 mEq/L (ref 19–32)
Calcium: 9.4 mg/dL (ref 8.4–10.5)
Chloride: 108 mEq/L (ref 96–112)
Creatinine, Ser: 1.54 mg/dL — ABNORMAL HIGH (ref 0.40–1.50)
GFR: 45.43 mL/min — ABNORMAL LOW (ref >60.00)
Glucose, Bld: 111 mg/dL — ABNORMAL HIGH (ref 70–99)
Potassium: 3.7 mEq/L (ref 3.5–5.1)
Sodium: 143 mEq/L (ref 135–145)

## 2021-05-09 MED ORDER — BELSOMRA 15 MG PO TABS: 15.0000 mg | ORAL_TABLET | Freq: Every evening | ORAL | 0 refills | Status: DC | PRN

## 2021-05-09 NOTE — Patient Instructions (Signed)

## 2021-05-09 NOTE — Progress Notes (Signed)
Subjective:  Patient ID: James Sawyer, male    DOB: March 29, 1951  Age: 71 y.o. MRN: [id number]  CC: Hypertension  This visit occurred during the SARS-CoV-2 public health emergency.  Safety protocols were in place, including screening questions prior to the visit, additional usage of staff PPE, and extensive cleaning of exam room while observing appropriate contact time as indicated for disinfecting solutions.    HPI James Sawyer presents for f/up -  He complains of insomnia with difficulty falling asleep.  When he does not sleep at night he feels the need to take naps during the day and complains of chronic fatigue.  He is active and denies chest pain, shortness of breath, diaphoresis, or edema.  Outpatient Medications Prior to Visit  Medication Sig Dispense Refill   amLODipine (NORVASC) 10 MG tablet Take 1 tablet (10 mg total) by mouth daily. 30 tablet 11   atorvastatin (LIPITOR) 80 MG tablet Take 1 tablet (80 mg total) by mouth daily. 30 tablet 11   ezetimibe (ZETIA) 10 MG tablet Take 1 tablet (10 mg total) by mouth daily. 30 tablet 11   hydrALAZINE (APRESOLINE) 50 MG tablet Take 1 tablet (50 mg total) by mouth 2 (two) times daily. 60 tablet 11   irbesartan (AVAPRO) 75 MG tablet Take 1 tablet (75 mg total) by mouth daily. 30 tablet 11   nicotine (NICODERM CQ - DOSED IN MG/24 HOURS) 14 mg/24hr patch Place 1 patch (14 mg total) onto the skin daily. 28 patch 0   buPROPion (WELLBUTRIN XL) 150 MG 24 hr tablet Take 1 tablet (150 mg total) by mouth daily. 30 tablet 0   DULoxetine (CYMBALTA) 60 MG capsule Take 1 capsule (60 mg total) by mouth daily. 30 capsule 0   No facility-administered medications prior to visit.    ROS Review of Systems  Constitutional:  Positive for fatigue. Negative for diaphoresis and unexpected weight change.  HENT: Negative.    Eyes: Negative.   Respiratory:  Negative for cough, chest tightness, shortness of breath and wheezing.   Cardiovascular:  Negative for  chest pain, palpitations and leg swelling.  Gastrointestinal:  Negative for abdominal pain, diarrhea and nausea.  Endocrine: Negative.   Genitourinary: Negative.   Musculoskeletal: Negative.  Negative for myalgias.  Skin: Negative.   Neurological:  Negative for dizziness, weakness and light-headedness.  Hematological:  Negative for adenopathy. Does not bruise/bleed easily.  Psychiatric/Behavioral:  Positive for sleep disturbance. Negative for dysphoric mood and suicidal ideas. The patient is not nervous/anxious.    Objective:  BP 128/78 (BP Location: Right Arm, Patient Position: Sitting, Cuff Size: Large)    Pulse 78    Temp 98.4 F (36.9 C) (Oral)    Resp 16    Ht 5\' 11"  (1.803 m)    Wt 256 lb 4 oz (116.2 kg)    SpO2 98%    BMI 35.74 kg/m   BP Readings from Last 3 Encounters:  05/09/21 128/78  04/25/21 (!) 158/98  03/28/21 (!) 150/92    Wt Readings from Last 3 Encounters:  05/09/21 256 lb 4 oz (116.2 kg)  04/04/21 255 lb (115.7 kg)  02/16/21 255 lb (115.7 kg)    Physical Exam Vitals reviewed.  Constitutional:      Appearance: He is obese.  HENT:     Nose: Nose normal.     Mouth/Throat:     Mouth: Mucous membranes are moist.  Eyes:     General: No scleral icterus.    Conjunctiva/sclera: Conjunctivae normal.  Cardiovascular:     Rate and Rhythm: Normal rate and regular rhythm.     Heart sounds: No murmur heard. Pulmonary:     Effort: Pulmonary effort is normal.     Breath sounds: No stridor. No wheezing, rhonchi or rales.  Abdominal:     General: Abdomen is protuberant. Bowel sounds are normal. There is no distension.     Palpations: Abdomen is soft. There is no hepatomegaly, splenomegaly or mass.     Tenderness: There is no abdominal tenderness.  Musculoskeletal:        General: Normal range of motion.     Cervical back: Neck supple.     Right lower leg: No edema.     Left lower leg: No edema.  Lymphadenopathy:     Cervical: No cervical adenopathy.  Skin:     General: Skin is warm.  Neurological:     General: No focal deficit present.     Mental Status: He is alert.  Psychiatric:        Mood and Affect: Mood normal.        Behavior: Behavior normal.        Thought Content: Thought content normal.        Judgment: Judgment normal.    Lab Results  Component Value Date   WBC 5.9 02/03/2021   HGB 14.5 02/03/2021   HCT 44.4 02/03/2021   PLT 400 02/03/2021   GLUCOSE 111 (H) 05/09/2021   CHOL 144 03/28/2021   TRIG 220 (H) 03/28/2021   HDL 58 03/28/2021   LDLDIRECT 145.0 08/26/2014   LDLCALC 51 03/28/2021   ALT 22 04/25/2021   AST 23 04/25/2021   NA 143 05/09/2021   K 3.7 05/09/2021   CL 108 05/09/2021   CREATININE 1.54 (H) 05/09/2021   BUN 23 05/09/2021   CO2 28 05/09/2021   TSH 3.50 05/09/2021   PSA 0.75 02/01/2021   INR 1.0 08/19/2018   HGBA1C 6.2 05/09/2021    DG Chest 2 View  Result Date: 02/01/2021 CLINICAL DATA:  Shortness of breath EXAM: CHEST - 2 VIEW COMPARISON:  08/19/2018 FINDINGS: The heart size and mediastinal contours are within normal limits. Both lungs are clear. The visualized skeletal structures are unremarkable. Mild chronic elevation right diaphragm. IMPRESSION: No active cardiopulmonary disease. Electronically Signed   By: Donavan Foil M.D.   On: 02/01/2021 19:48   ECHOCARDIOGRAM COMPLETE  Result Date: 02/02/2021    ECHOCARDIOGRAM REPORT   Patient Name:   James Sawyer Date of Exam: 02/02/2021 Medical Rec #:  [id number]     Height:       70.0 in Accession #:    [id number]    Weight:       260.0 lb Date of Birth:  17-Aug-1950      BSA:          2.334 m Patient Age:    54 years      BP:           196/99 mmHg Patient Gender: M             HR:           59 bpm. Exam Location:  Inpatient Procedure: 2D Echo, Color Doppler, Cardiac Doppler and Intracardiac            Opacification Agent Indications:     I25.110 Atherosclerotic heart disease of native coronary artery                  with  unstable angina pectoris  History:          Patient has prior history of Echocardiogram examinations, most                  recent 10/22/2014. CAD.  Sonographer:     Roseanna Rainbow RDCS Referring Phys:  [place] Diagnosing Phys: Dixie Dials MD IMPRESSIONS  1. Left ventricular ejection fraction, by estimation, is 55 to 60%. The left ventricle has normal function. The left ventricle has no regional wall motion abnormalities. There is moderate concentric left ventricular hypertrophy. Left ventricular diastolic parameters are consistent with Grade I diastolic dysfunction (impaired relaxation).  2. Right ventricular systolic function is normal. The right ventricular size is normal.  3. The mitral valve is normal in structure. Mild mitral valve regurgitation.  4. The aortic valve is tricuspid. There is moderate calcification of the aortic valve. There is mild thickening of the aortic valve. Aortic valve regurgitation is not visualized. Mild aortic valve sclerosis is present, with no evidence of aortic valve stenosis.  5. There is mild (Grade II) atheroma plaque involving the aortic root and ascending aorta.  6. The inferior vena cava is normal in size with <50% respiratory variability, suggesting right atrial pressure of 8 mmHg. FINDINGS  Left Ventricle: Left ventricular ejection fraction, by estimation, is 55 to 60%. The left ventricle has normal function. The left ventricle has no regional wall motion abnormalities. Definity contrast agent was given IV to delineate the left ventricular  endocardial borders. The left ventricular internal cavity size was normal in size. There is moderate concentric left ventricular hypertrophy. Left ventricular diastolic parameters are consistent with Grade I diastolic dysfunction (impaired relaxation). Right Ventricle: The right ventricular size is normal. No increase in right ventricular wall thickness. Right ventricular systolic function is normal. Left Atrium: Left atrial size was normal in size. Right Atrium:  Right atrial size was not assessed. Pericardium: There is no evidence of pericardial effusion. Mitral Valve: The mitral valve is normal in structure. There is mild thickening of the mitral valve leaflet(s). There is mild calcification of the mitral valve leaflet(s). Mild mitral annular calcification. Mild mitral valve regurgitation. Tricuspid Valve: The tricuspid valve is normal in structure. Tricuspid valve regurgitation is trivial. Aortic Valve: The aortic valve is tricuspid. There is moderate calcification of the aortic valve. There is mild thickening of the aortic valve. There is mild aortic valve annular calcification. Aortic valve regurgitation is not visualized. Mild aortic valve sclerosis is present, with no evidence of aortic valve stenosis. Pulmonic Valve: The pulmonic valve was grossly normal. Pulmonic valve regurgitation is not visualized. Aorta: The aortic root is normal in size and structure. There is mild (Grade II) atheroma plaque involving the aortic root and ascending aorta. Venous: The inferior vena cava is normal in size with less than 50% respiratory variability, suggesting right atrial pressure of 8 mmHg. IAS/Shunts: The atrial septum is grossly normal.  LEFT VENTRICLE PLAX 2D LVIDd:         5.10 cm      Diastology LVIDs:         3.60 cm      LV e' medial:    3.83 cm/s LV PW:         1.60 cm      LV E/e' medial:  13.8 LV IVS:        1.60 cm      LV e' lateral:   5.68 cm/s LVOT diam:     2.20  cm      LV E/e' lateral: 9.3 LV SV:         73 LV SV Index:   31 LVOT Area:     3.80 cm                              3D Volume EF: LV Volumes (MOD)            3D EF:        40 % LV vol d, MOD A2C: 110.0 ml LV EDV:       186 ml LV vol d, MOD A4C: 76.4 ml  LV ESV:       112 ml LV vol s, MOD A2C: 59.8 ml  LV SV:        74 ml LV vol s, MOD A4C: 44.0 ml LV SV MOD A2C:     50.2 ml LV SV MOD A4C:     76.4 ml LV SV MOD BP:      42.8 ml RIGHT VENTRICLE RV S prime:     8.61 cm/s TAPSE (M-mode): 1.4 cm LEFT ATRIUM              Index        RIGHT ATRIUM           Index LA diam:        4.30 cm 1.84 cm/m   RA Area:     11.50 cm LA Vol (A2C):   61.3 ml 26.27 ml/m  RA Volume:   22.50 ml  9.64 ml/m LA Vol (A4C):   29.0 ml 12.43 ml/m LA Biplane Vol: 42.8 ml 18.34 ml/m  AORTIC VALVE LVOT Vmax:   113.00 cm/s LVOT Vmean:  65.100 cm/s LVOT VTI:    0.193 m  AORTA Ao Root diam: 3.70 cm Ao Asc diam:  3.80 cm MITRAL VALVE MV Area (PHT): 3.68 cm    SHUNTS MV Decel Time: 206 msec    Systemic VTI:  0.19 m MV E velocity: 52.70 cm/s  Systemic Diam: 2.20 cm MV A velocity: 67.10 cm/s MV E/A ratio:  0.79 Dixie Dials MD Electronically signed by Dixie Dials MD Signature Date/Time: 02/02/2021/12:48:56 PM    Final     Assessment & Plan:   Obrian was seen today for hypertension.  Diagnoses and all orders for this visit:  Essential hypertension- His blood pressure is adequately well controlled. -     Basic metabolic panel; Future -     TSH; Future -     TSH -     Basic metabolic panel  Prediabetes- His A1c is at 6.2%.  Medical therapy is not indicated. -     Hemoglobin A1c; Future -     Hemoglobin A1c  Psychophysiological insomnia -     Suvorexant (BELSOMRA) 15 MG TABS; Take 15 mg by mouth at bedtime as needed.  Need for prophylactic vaccination with combined diphtheria-tetanus-pertussis (DTP) vaccine -     Tdap (BOOSTRIX) 5-2.5-18.5 LF-MCG/0.5 injection; Inject 0.5 mLs into the muscle once for 1 dose.  Stage 3a chronic kidney disease ([place])- His blood pressure is adequately well controlled.  He will avoid nephrotoxic agents.   I am having Bethann Humble start on Belsomra and Boostrix. I am also having him maintain his buPROPion, DULoxetine, nicotine, atorvastatin, ezetimibe, amLODipine, irbesartan, and hydrALAZINE.  Meds ordered this encounter  Medications   Suvorexant (BELSOMRA) 15 MG TABS    Sig: Take 15  mg by mouth at bedtime as needed.    Dispense:  90 tablet    Refill:  0   Tdap (BOOSTRIX) 5-2.5-18.5 LF-MCG/0.5  injection    Sig: Inject 0.5 mLs into the muscle once for 1 dose.    Dispense:  0.5 mL    Refill:  0     Follow-up: Return in about 4 months (around 09/06/2021).  Scarlette Calico, MD

## 2021-05-10 ENCOUNTER — Encounter: Payer: Self-pay | Admitting: Internal Medicine

## 2021-05-10 LAB — TSH: TSH: 3.5 u[IU]/mL (ref 0.35–5.50)

## 2021-05-10 MED ORDER — BOOSTRIX 5-2.5-18.5 LF-MCG/0.5 IM SUSP: 0.5000 mL | Freq: Once | INTRAMUSCULAR | 0 refills | Status: AC

## 2021-05-22 NOTE — Progress Notes (Signed)
Patient ID: James Sawyer                 DOB: 05/06/1950                      MRN: [id number]    HPI: James Sawyer is a 71 y.o. male, patient of Dr. Harrington Challenger, referred by after seeing Melina Copa, PA-C on 02/16/21 to HTN clinic. PMH is significant for CAD with DES to RCA 2016 with residual mild CAD, 2:1 AVB while sleeping in 2016, bradycardia (not on BB due to this), CKD stage 3, HLD, tobacco abuse, HTN, morbid obesity, severe OSA, wide-complex tachycardia, tobacco abuse, right ganglion cyst, and depression.  He was recently admitted in October 2022 with DOE and mildly elevated troponins felt due to poorly controlled blood pressure. Lexiscan nuclear stress test was felt to be probably normal with thinning of the inferobasal wall but no ischemia; gated EF calculated at 36% but visually appears normal; suggested echocardiogram to better assess LV function. 2D echo showed EF 55-60%, moderate LVH, grade 1 DD, mild MR, mild atheroma of aortic root/ascending aorta. He had 7 beats WCT in complex of hypokalemia which was repleted. Patient was discharged with titration of his antihypertensives. At hospital f/u on 02/16/21, BP was 150/90 and it was discovered he had not yet increased amlodipine. Labs at that visit showed recent decline in kidney function and indapamide and potassium supplementation were discontinued. Seen now by HTN clinic three times. Irbesartan dose has been decreased due to worsened renal function. At last visit 04/25/21, patient still reported noncompliance with TID dosing of hydralazine but was willing to take it BID. At his PCP visit on 05/09/21, BP was at goal.   Today, patient arrives in good spirits. Denies headaches, dizziness, blurred vision, swelling. Reports his energy is coming back gradually. He has continued to decrease how much he smokes per day. Reports adherence to medications. He has taken his medications today. Reports occasionally forgetting to take the evening dose of hydralazine but  only twice in the past week. He brings a bag of medication bottles that he no longer takes for me to dispose of to prevent confusion regarding medications he is supposed to be taking.   Current HTN meds: amlodipine 10 mg daily (AM), irbesartan 75 mg daily (AM), hydralazine 50 mg BID Previously tried: beta blocker (bradycardia), azilsartan-chlorthalidone (high cost), valsartan-HCTZ (switched to alternative ARB), lisinopril-HCTZ (cough with ACEi), indapamide (stopped due to decline in renal function), hydralazine TID (pt preference - will only take BID) BP goal: <130/80 mmHg  Family History: Patient was adopted  Social History: Every day smoker 0.25 packs per day (10 pack-years) - down to 1 pack lasting 5 days  Diet: Chicken/turkey, vegetables every now and then, seasons food with lemon pepper instead of salt. Reports trying to eat less food.   Exercise: not exercising currently  Home BP readings: Wrist cuff previously validated as accurate with clinic cuff and technique was corrected. Is not checking currently.   Labs:  -05/09/21: Scr 1.54, Na 143, K 3.7 (irbesartan 75 mg) -03/28/21: Scr 1.46, Na 146, K 4.3 (irbesartan 150 mg), LDL 51 (atorvastatin 80 mg, ezetimibe 10 mg) -03/07/21: Scr 1.94, Na 146, K 4.0 (irbesartan 150 mg) -02/24/21: Scr 1.85, K 4.4, Na 147 (irbesartan 150 mg; stopped indapamide & Kcl 1 week prior) -02/16/21: Scr 1.99, K 4.1, Na 144 (irbesartan 150 mg, indapamide 1.25 mg daily, Kcl 20 mEq daily) -02/04/21: Scr 1.48, K 3.4, Na  139, Mg 2.2 (Kcl 20 mEq daily)  Wt Readings from Last 3 Encounters:  05/09/21 256 lb 4 oz (116.2 kg)  04/04/21 255 lb (115.7 kg)  02/16/21 255 lb (115.7 kg)   BP Readings from Last 3 Encounters:  05/09/21 128/78  04/25/21 (!) 158/98  03/28/21 (!) 150/92   Pulse Readings from Last 3 Encounters:  05/09/21 78  04/25/21 67  03/28/21 72    Renal function: Estimated Creatinine Clearance: 57.9 mL/min (A) (by C-G formula based on SCr of 1.54  mg/dL (H)).  Past Medical History:  Diagnosis Date   AV block    a. prior 2:1 AVB during 2016 admission felt 2/2 sleep apnea.   Bradycardia    CAD (coronary artery disease)    a. 10/2014 MV: inflat ischemia, EF 46%;  b. 10/2014 Echo: EF 60-65%, apical AK, Gr2 DD;  c. 10/2014 Cath/PCI: LM nl, LAD min irregs, LCX 9m, OM1/2/3 min irregs, RCA 30p, 90p (4.0x15 Resolute Integrity DES).   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 brief during 01/2021 admission.    Current Outpatient Medications on File Prior to Visit  Medication Sig Dispense Refill   amLODipine (NORVASC) 10 MG tablet Take 1 tablet (10 mg total) by mouth daily. 30 tablet 11   atorvastatin (LIPITOR) 80 MG tablet Take 1 tablet (80 mg total) by mouth daily. 30 tablet 11   buPROPion (WELLBUTRIN XL) 150 MG 24 hr tablet Take 1 tablet (150 mg total) by mouth daily. 30 tablet 0   DULoxetine (CYMBALTA) 60 MG capsule Take 1 capsule (60 mg total) by mouth daily. 30 capsule 0   ezetimibe (ZETIA) 10 MG tablet Take 1 tablet (10 mg total) by mouth daily. 30 tablet 11   hydrALAZINE (APRESOLINE) 50 MG tablet Take 1 tablet (50 mg total) by mouth 2 (two) times daily. 60 tablet 11   irbesartan (AVAPRO) 75 MG tablet Take 1 tablet (75 mg total) by mouth daily. 30 tablet 11   nicotine (NICODERM CQ - DOSED IN MG/24 HOURS) 14 mg/24hr patch Place 1 patch (14 mg total) onto the skin daily. 28 patch 0   Suvorexant (BELSOMRA) 15 MG TABS Take 15 mg by mouth at bedtime as needed. 90 tablet 0   [DISCONTINUED] testosterone cypionate (DEPOTESTOTERONE CYPIONATE) 200 MG/ML injection  Inject 200 mg into the muscle every 28 days.       No current facility-administered medications on file prior to visit.    No Known Allergies  Assessment/Plan:  1. Hypertension - BP in clinic today at goal <130/80 mmHg. Now with two BP readings at goal (today and at PCP visit on 05/09/21). Will continue current medications: amlodipine 10 mg daily, irbesartan 75 mg daily, and hydralazine 50 mg BID. Continue limiting sodium intake. Encouraged him to start exercising again. Instructed patient to monitor BP at home and to reach out if it starts to become elevated again or if he experiences symptoms of high BP like headache. Follow up with HTN clinic as needed.   Rebbeca Paul, PharmD PGY2 Ambulatory Care Pharmacy Resident  05/22/2021 11:36 AM

## 2021-05-23 ENCOUNTER — Ambulatory Visit: Payer: Medicare HMO | Admitting: Student-PharmD

## 2021-05-23 ENCOUNTER — Other Ambulatory Visit: Payer: Self-pay

## 2021-05-23 VITALS — BP 120/76 | HR 70

## 2021-05-23 DIAGNOSIS — I10 Essential (primary) hypertension: Secondary | ICD-10-CM

## 2021-05-23 NOTE — Patient Instructions (Signed)
It was nice to see you today!  Your goal blood pressure is less than 130/80 mmHg. In clinic, your blood pressure was 120/76 mmHg.  Medication Changes:  Continue your current medications: amlodipine 10 mg daily, irbesartan 75 mg daily, hydralazine 50 mg twice daily  Monitor blood pressure at home daily and keep a log (on your phone or piece of paper) to bring with you to your next visit. Write down date, time, blood pressure and pulse.  Keep up the good work with diet and exercise. Aim for a diet full of vegetables, fruit and lean meats (chicken, [place], fish). Try to limit salt intake by eating fresh or frozen vegetables (instead of canned), rinse canned vegetables prior to cooking and do not add any additional salt to meals.

## 2021-06-29 NOTE — Telephone Encounter (Signed)
Prior Authorization for titration sent to [facility] via web portal. Titration denied APAP suggested. Will order APAP. ? ?Upon patient request DME selection is [facility] ?Patient understands he will be contacted by [facility] to set up his cpap. ?Patient understands to call if [facility] does not contact him with new setup in a timely manner. ?Patient understands they will be called once confirmation has been received from [facility]/ that they have received their new machine to schedule 10 week follow up appointment. ?  ?Day notified of new cpap order  ?Please add to airview ?Patient was grateful for the call and thanked me.  ?

## 2021-08-15 NOTE — Telephone Encounter (Signed)
Late entry: ?I found out that we never heard back from this patient to be able to schedule set up.  We initially spoke with him, and he advised that he did not want to put his credit card on file, and he would think about it and call us back.  We made several more attempts to connect with him.  He did not have voicemail set up, but we did text him as well.   ? ?Let us know if you are able to reach him and if he wants to move forward.  ? ?Thank you! ?Melissa  ? ?Melissa Stenson ?Account Manager, [place] ? ? ? ?

## 2021-09-06 IMAGING — CT CT RENAL STONE PROTOCOL
2 of 4 series · 16 of 46 positions shown, 18 images · non-contrast
Comparison: 04/25/2014

CLINICAL DATA: Right-sided flank pain

EXAM:
CT ABDOMEN AND PELVIS WITHOUT CONTRAST
TECHNIQUE: Multidetector CT imaging of the abdomen and pelvis was performed
following the standard protocol without IV contrast.

[Series 2: axial st · axial · 0.80mm/px · z∈[-506,-66]mm · 13 of 100 slices shown, 15 images]
[im 6/100  soft-tissue]
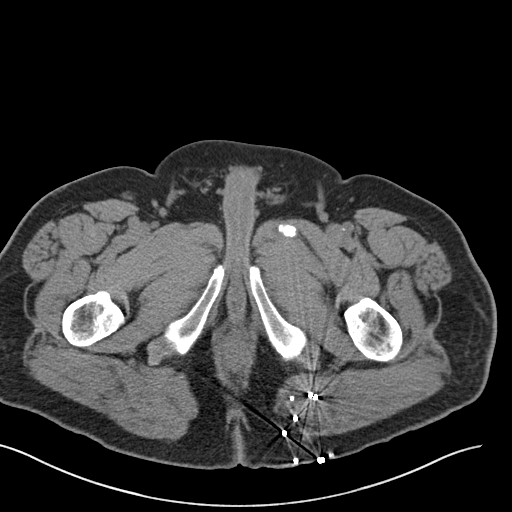
[im 6/100  bone]
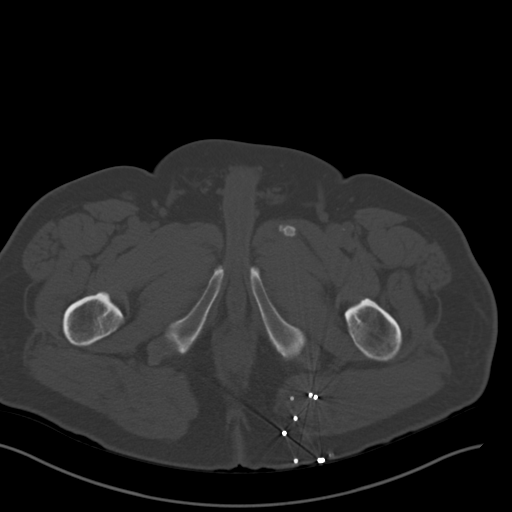
[im 16/100  soft-tissue]
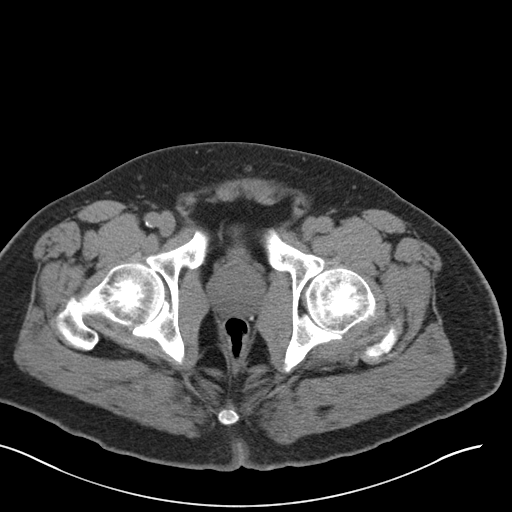
[im 21/100  soft-tissue]
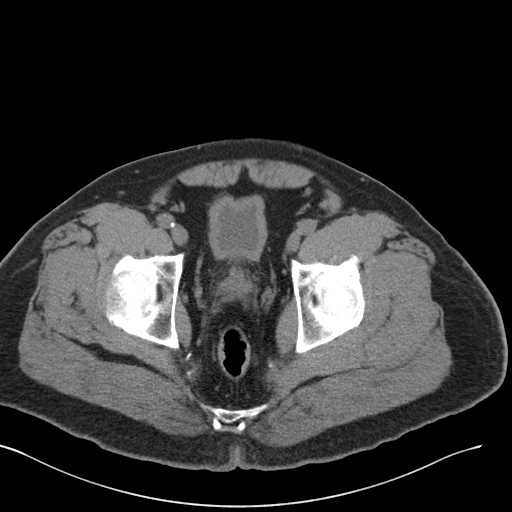
[im 27/100  soft-tissue]
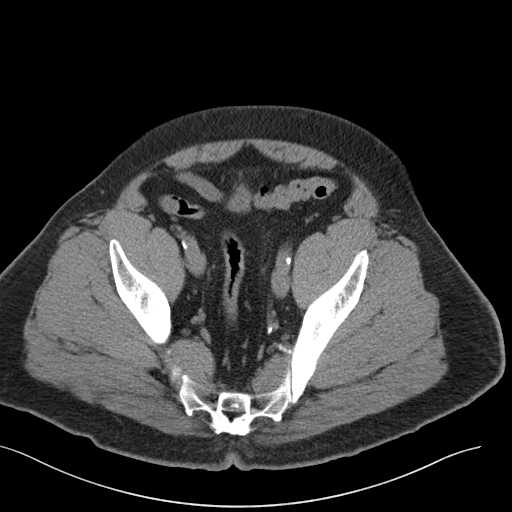
[im 37/100  soft-tissue]
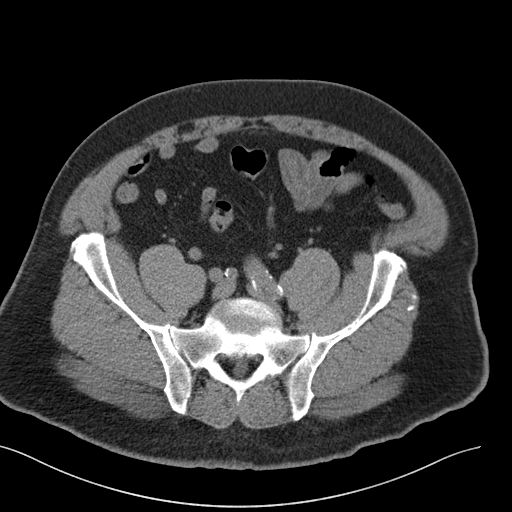
[im 42/100  soft-tissue]
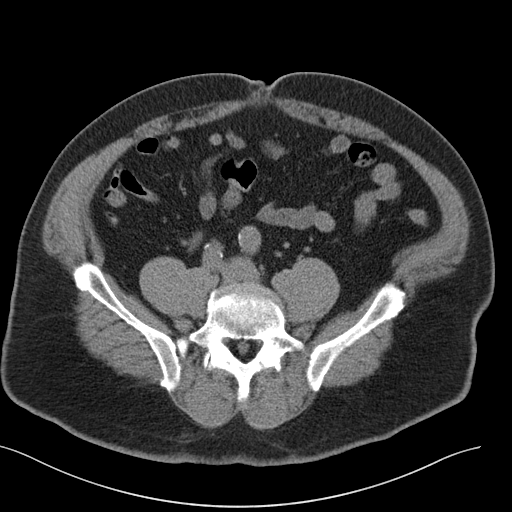
[im 53/100  soft-tissue]
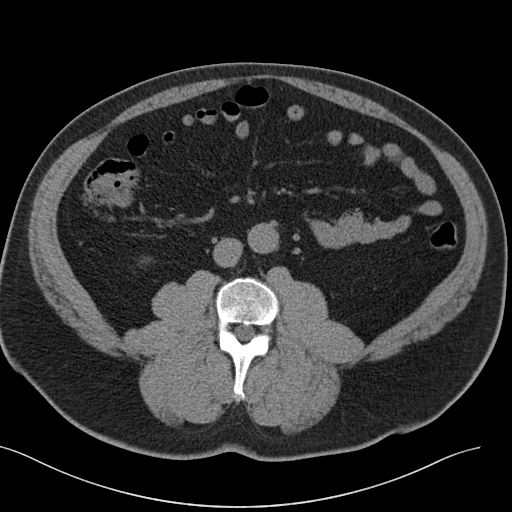
[im 58/100  soft-tissue]
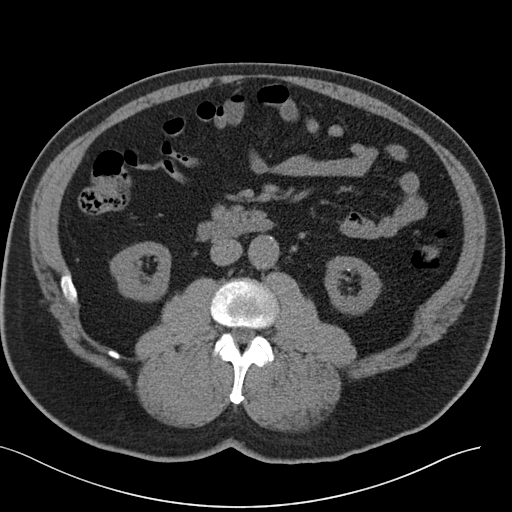
[im 63/100  soft-tissue]
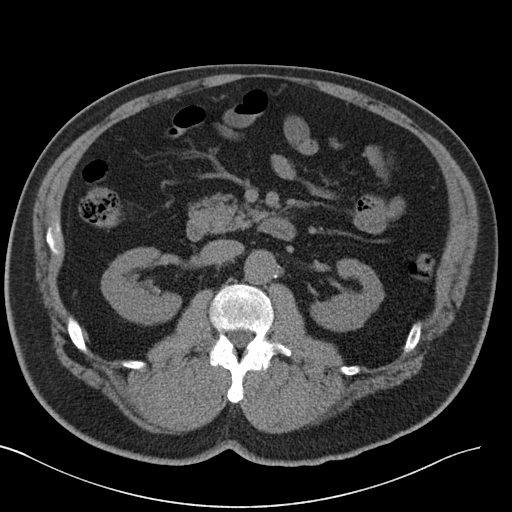
[im 63/100  bone]
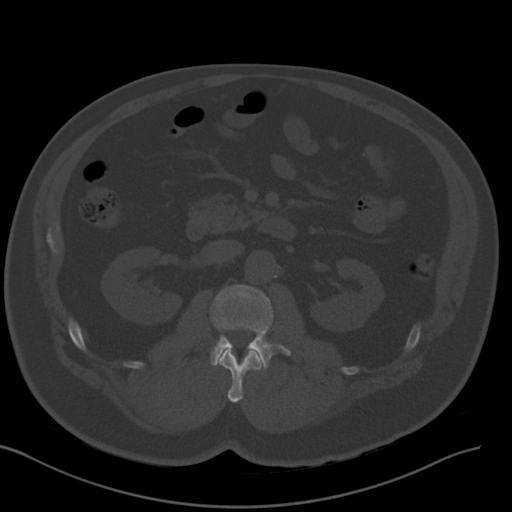
[im 73/100  soft-tissue]
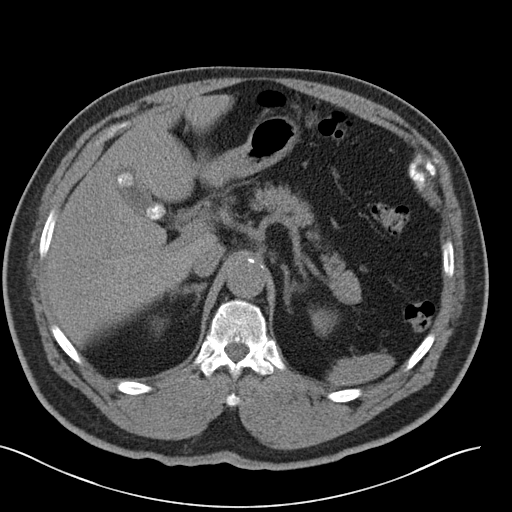
[im 79/100  soft-tissue]
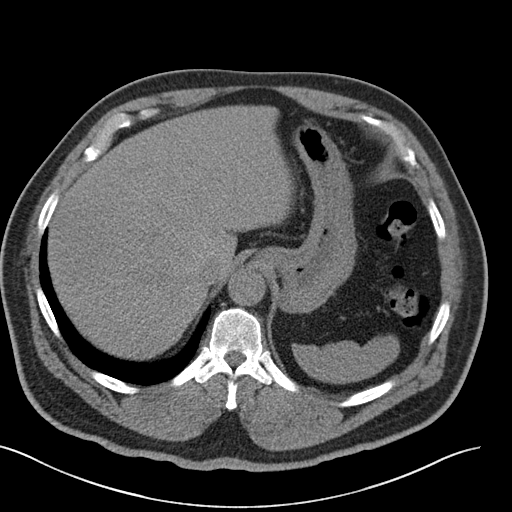
[im 84/100  soft-tissue]
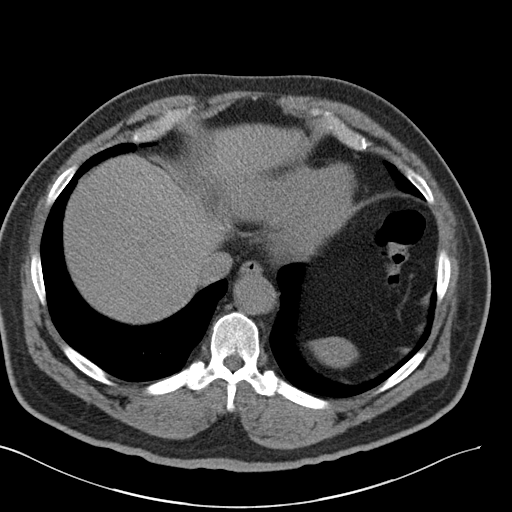
[im 94/100  soft-tissue]
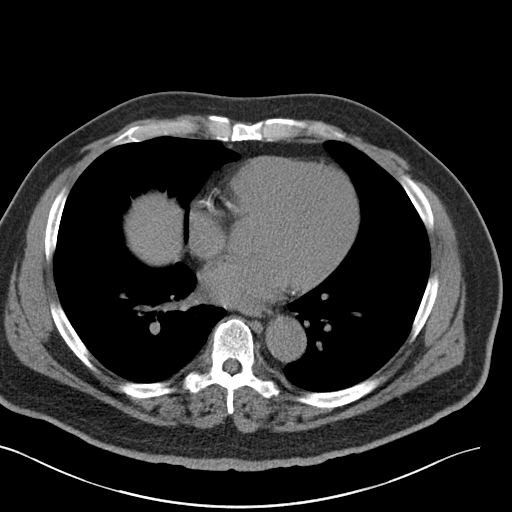

[Series 4: coronal · coronal · 0.76mm/px · 3 of 158 slices shown]
[im 53/158  soft-tissue]
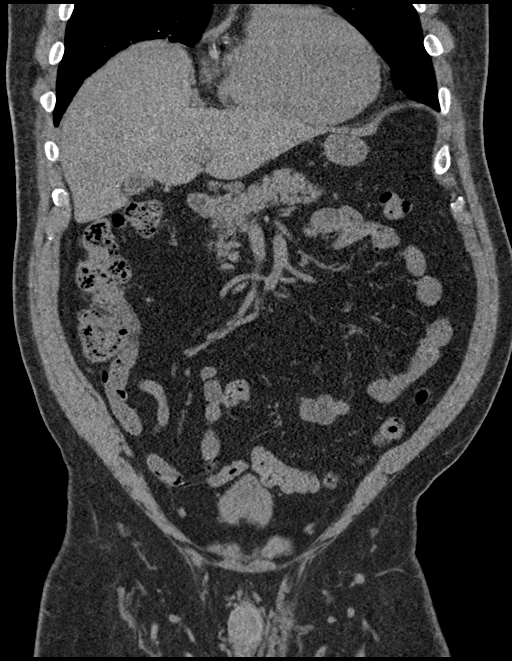
[im 70/158  soft-tissue]
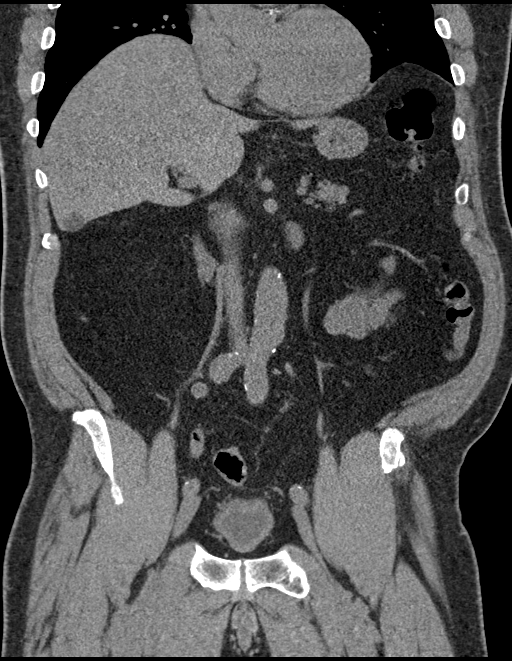
[im 88/158  soft-tissue]
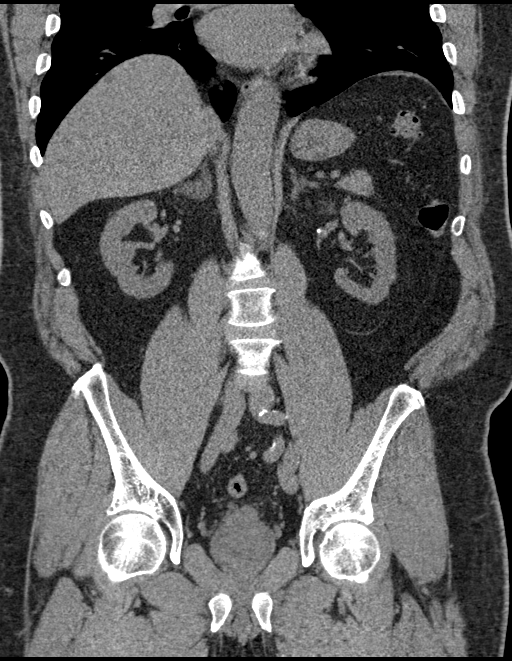

[16 of 46 positions shown; findings below may reference images not displayed]

FINDINGS: Lower chest: Lung bases demonstrate no acute consolidation or
pleural effusion. Heart size within normal limits. Coronary vascular
calcification.

Hepatobiliary: Stable subcentimeter hypodensity at the dome of the
right lobe. Small cyst in the inferior right hepatic lobe. Calcified
gallstones. No biliary dilatation.

Pancreas: Unremarkable. No pancreatic ductal dilatation or
surrounding inflammatory changes.

Spleen: Normal in size without focal abnormality.

Adrenals/Urinary Tract: Adrenal glands are normal. No hydronephrosis
or ureteral stone. Intrarenal vascular calcifications. Punctate
stone lower pole left kidney. Multiple stones in the right kidney,
the largest is seen in the lower pole and measures 4 mm. Bladder is
thick walled and nearly empty.

Stomach/Bowel: Stomach is within normal limits. Appendix appears
normal. No evidence of bowel wall thickening, distention, or
inflammatory changes.

Vascular/Lymphatic: Moderate aortic atherosclerosis. No aneurysm. No
significant adenopathy

Reproductive: Enlarged prostate.

Other: Negative for free air or free fluid.

Musculoskeletal: No acute or significant osseous findings.
IMPRESSION: 1. Intrarenal stones but negative for hydronephrosis or ureteral
stone.
2. Gallstones

## 2021-09-07 ENCOUNTER — Ambulatory Visit (INDEPENDENT_AMBULATORY_CARE_PROVIDER_SITE_OTHER): Payer: Medicare HMO | Admitting: Internal Medicine

## 2021-09-07 ENCOUNTER — Encounter: Payer: Self-pay | Admitting: Internal Medicine

## 2021-09-07 VITALS — BP 136/78 | HR 83 | Temp 98.7°F | Resp 16 | Ht 71.0 in | Wt 248.0 lb

## 2021-09-07 DIAGNOSIS — Z23 Encounter for immunization: Secondary | ICD-10-CM

## 2021-09-07 DIAGNOSIS — I10 Essential (primary) hypertension: Secondary | ICD-10-CM | POA: Diagnosis not present

## 2021-09-07 DIAGNOSIS — Z720 Tobacco use: Secondary | ICD-10-CM | POA: Diagnosis not present

## 2021-09-07 DIAGNOSIS — E538 Deficiency of other specified B group vitamins: Secondary | ICD-10-CM

## 2021-09-07 DIAGNOSIS — N1831 Chronic kidney disease, stage 3a: Secondary | ICD-10-CM

## 2021-09-07 DIAGNOSIS — J410 Simple chronic bronchitis: Secondary | ICD-10-CM

## 2021-09-07 DIAGNOSIS — R7303 Prediabetes: Secondary | ICD-10-CM | POA: Diagnosis not present

## 2021-09-07 DIAGNOSIS — E519 Thiamine deficiency, unspecified: Secondary | ICD-10-CM

## 2021-09-07 LAB — URINALYSIS, ROUTINE W REFLEX MICROSCOPIC
Hgb urine dipstick: NEGATIVE
Leukocytes,Ua: NEGATIVE
Nitrite: NEGATIVE
Specific Gravity, Urine: 1.02 (ref 1.000–1.030)
Urine Glucose: NEGATIVE
Urobilinogen, UA: 0.2 (ref 0.0–1.0)
pH: 6 (ref 5.0–8.0)

## 2021-09-07 LAB — CBC WITH DIFFERENTIAL/PLATELET
Basophils Absolute: 0 10*3/uL (ref 0.0–0.1)
Basophils Relative: 0.3 % (ref 0.0–3.0)
Eosinophils Absolute: 0.1 10*3/uL (ref 0.0–0.7)
Eosinophils Relative: 2.6 % (ref 0.0–5.0)
HCT: 40.6 % (ref 39.0–52.0)
Hemoglobin: 13.4 g/dL (ref 13.0–17.0)
Lymphocytes Relative: 45 % (ref 12.0–46.0)
Lymphs Abs: 2.4 10*3/uL (ref 0.7–4.0)
MCHC: 33 g/dL (ref 30.0–36.0)
MCV: 94.6 fl (ref 78.0–100.0)
Monocytes Absolute: 0.5 10*3/uL (ref 0.1–1.0)
Monocytes Relative: 8.8 % (ref 3.0–12.0)
Neutro Abs: 2.3 10*3/uL (ref 1.4–7.7)
Neutrophils Relative %: 43.3 % (ref 43.0–77.0)
Platelets: 435 10*3/uL — ABNORMAL HIGH (ref 150.0–400.0)
RBC: 4.29 Mil/uL (ref 4.22–5.81)
RDW: 15.9 % — ABNORMAL HIGH (ref 11.5–15.5)
WBC: 5.2 10*3/uL (ref 4.0–10.5)

## 2021-09-07 LAB — BASIC METABOLIC PANEL
BUN: 14 mg/dL (ref 6–23)
CO2: 29 mEq/L (ref 19–32)
Calcium: 9.6 mg/dL (ref 8.4–10.5)
Chloride: 108 mEq/L (ref 96–112)
Creatinine, Ser: 1.54 mg/dL — ABNORMAL HIGH (ref 0.40–1.50)
GFR: 45.32 mL/min — ABNORMAL LOW (ref >60.00)
Glucose, Bld: 69 mg/dL — ABNORMAL LOW (ref 70–99)
Potassium: 3.5 mEq/L (ref 3.5–5.1)
Sodium: 145 mEq/L (ref 135–145)

## 2021-09-07 LAB — FOLATE: Folate: 6.7 ng/mL (ref >5.9)

## 2021-09-07 LAB — HEMOGLOBIN A1C: Hgb A1c MFr Bld: 6.3 % (ref 4.6–6.5)

## 2021-09-07 MED ORDER — CYANOCOBALAMIN 1000 MCG/ML IJ SOLN
1000.0000 ug | Freq: Once | INTRAMUSCULAR | Status: AC
  Administered 2021-09-07: 1000 ug via INTRAMUSCULAR

## 2021-09-07 NOTE — Patient Instructions (Signed)
Hypertension, Adult High blood pressure (hypertension) is when the force of blood pumping through the arteries is too strong. The arteries are the blood vessels that carry blood from the heart throughout the body. Hypertension forces the heart to work harder to pump blood and may cause arteries to become narrow or stiff. Untreated or uncontrolled hypertension can lead to a heart attack, heart failure, a stroke, kidney disease, and other problems. A blood pressure reading consists of a higher number over a lower number. Ideally, your blood pressure should be below 120/80. The first ("top") number is called the systolic pressure. It is a measure of the pressure in your arteries as your heart beats. The second ("bottom") number is called the diastolic pressure. It is a measure of the pressure in your arteries as the heart relaxes. What are the causes? The exact cause of this condition is not known. There are some conditions that result in high blood pressure. What increases the risk? Certain factors may make you more likely to develop high blood pressure. Some of these risk factors are under your control, including: Smoking. Not getting enough exercise or physical activity. Being overweight. Having too much fat, sugar, calories, or salt (sodium) in your diet. Drinking too much alcohol. Other risk factors include: Having a personal history of heart disease, diabetes, high cholesterol, or kidney disease. Stress. Having a family history of high blood pressure and high cholesterol. Having obstructive sleep apnea. Age. The risk increases with age. What are the signs or symptoms? High blood pressure may not cause symptoms. Very high blood pressure (hypertensive crisis) may cause: Headache. Fast or irregular heartbeats (palpitations). Shortness of breath. Nosebleed. Nausea and vomiting. Vision changes. Severe chest pain, dizziness, and seizures. How is this diagnosed? This condition is diagnosed by  measuring your blood pressure while you are seated, with your arm resting on a flat surface, your legs uncrossed, and your feet flat on the floor. The cuff of the blood pressure monitor will be placed directly against the skin of your upper arm at the level of your heart. Blood pressure should be measured at least twice using the same arm. Certain conditions can cause a difference in blood pressure between your right and left arms. If you have a high blood pressure reading during one visit or you have normal blood pressure with other risk factors, you may be asked to: Return on a different day to have your blood pressure checked again. Monitor your blood pressure at home for 1 week or longer. If you are diagnosed with hypertension, you may have other blood or imaging tests to help your health care provider understand your overall risk for other conditions. How is this treated? This condition is treated by making healthy lifestyle changes, such as eating healthy foods, exercising more, and reducing your alcohol intake. You may be referred for counseling on a healthy diet and physical activity. Your health care provider may prescribe medicine if lifestyle changes are not enough to get your blood pressure under control and if: Your systolic blood pressure is above 130. Your diastolic blood pressure is above 80. Your personal target blood pressure may vary depending on your medical conditions, your age, and other factors. Follow these instructions at home: Eating and drinking  Eat a diet that is high in fiber and potassium, and low in sodium, added sugar, and fat. An example of this eating plan is called the DASH diet. DASH stands for Dietary Approaches to Stop Hypertension. To eat this way: Eat   plenty of fresh fruits and vegetables. Try to fill one half of your plate at each meal with fruits and vegetables. Eat whole grains, such as whole-wheat pasta, brown rice, or whole-grain bread. Fill about one  fourth of your plate with whole grains. Eat or drink low-fat dairy products, such as skim milk or low-fat yogurt. Avoid fatty cuts of meat, processed or cured meats, and poultry with skin. Fill about one fourth of your plate with lean proteins, such as fish, chicken without skin, beans, eggs, or tofu. Avoid pre-made and processed foods. These tend to be higher in sodium, added sugar, and fat. Reduce your daily sodium intake. Many people with hypertension should eat less than 1,500 mg of sodium a day. Do not drink alcohol if: Your health care provider tells you not to drink. You are pregnant, may be pregnant, or are planning to become pregnant. If you drink alcohol: Limit how much you have to: 0-1 drink a day for women. 0-2 drinks a day for men. Know how much alcohol is in your drink. In the [place] one drink equals one 12 oz bottle of beer (355 mL), one 5 oz glass of wine (148 mL), or one 1 oz glass of hard liquor (44 mL). Lifestyle  Work with your health care provider to maintain a healthy body weight or to lose weight. Ask what an ideal weight is for you. Get at least 30 minutes of exercise that causes your heart to beat faster (aerobic exercise) most days of the week. Activities may include walking, swimming, or biking. Include exercise to strengthen your muscles (resistance exercise), such as Pilates or lifting weights, as part of your weekly exercise routine. Try to do these types of exercises for 30 minutes at least 3 days a week. Do not use any products that contain nicotine or tobacco. These products include cigarettes, chewing tobacco, and vaping devices, such as e-cigarettes. If you need help quitting, ask your health care provider. Monitor your blood pressure at home as told by your health care provider. Keep all follow-up visits. This is important. Medicines Take over-the-counter and prescription medicines only as told by your health care provider. Follow directions carefully. Blood  pressure medicines must be taken as prescribed. Do not skip doses of blood pressure medicine. Doing this puts you at risk for problems and can make the medicine less effective. Ask your health care provider about side effects or reactions to medicines that you should watch for. Contact a health care provider if you: Think you are having a reaction to a medicine you are taking. Have headaches that keep coming back (recurring). Feel dizzy. Have swelling in your ankles. Have trouble with your vision. Get help right away if you: Develop a severe headache or confusion. Have unusual weakness or numbness. Feel faint. Have severe pain in your chest or abdomen. Vomit repeatedly. Have trouble breathing. These symptoms may be an emergency. Get help right away. Call 911. Do not wait to see if the symptoms will go away. Do not drive yourself to the hospital. Summary Hypertension is when the force of blood pumping through your arteries is too strong. If this condition is not controlled, it may put you at risk for serious complications. Your personal target blood pressure may vary depending on your medical conditions, your age, and other factors. For most people, a normal blood pressure is less than 120/80. Hypertension is treated with lifestyle changes, medicines, or a combination of both. Lifestyle changes include losing weight, eating a healthy,   low-sodium diet, exercising more, and limiting alcohol. This information is not intended to replace advice given to you by your health care provider. Make sure you discuss any questions you have with your health care provider. Document Revised: 01/31/2021 Document Reviewed: 01/31/2021 Elsevier Patient Education  2023 Elsevier Inc.  

## 2021-09-07 NOTE — Progress Notes (Unsigned)
Subjective:  Patient ID: James Sawyer, male    DOB: [birth year]  Age: 71 y.o. MRN: [id number]  CC: No chief complaint on file.   HPI DEYON CHIZEK presents for ***  Outpatient Medications Prior to Visit  Medication Sig Dispense Refill   amLODipine (NORVASC) 10 MG tablet Take 1 tablet (10 mg total) by mouth daily. 30 tablet 11   atorvastatin (LIPITOR) 80 MG tablet Take 1 tablet (80 mg total) by mouth daily. 30 tablet 11   ezetimibe (ZETIA) 10 MG tablet Take 1 tablet (10 mg total) by mouth daily. 30 tablet 11   hydrALAZINE (APRESOLINE) 50 MG tablet Take 1 tablet (50 mg total) by mouth 2 (two) times daily. 60 tablet 11   irbesartan (AVAPRO) 75 MG tablet Take 1 tablet (75 mg total) by mouth daily. 30 tablet 11   nicotine (NICODERM CQ - DOSED IN MG/24 HOURS) 14 mg/24hr patch Place 1 patch (14 mg total) onto the skin daily. 28 patch 0   Suvorexant (BELSOMRA) 15 MG TABS Take 15 mg by mouth at bedtime as needed. 90 tablet 0   buPROPion (WELLBUTRIN XL) 150 MG 24 hr tablet Take 1 tablet (150 mg total) by mouth daily. 30 tablet 0   DULoxetine (CYMBALTA) 60 MG capsule Take 1 capsule (60 mg total) by mouth daily. 30 capsule 0   No facility-administered medications prior to visit.    ROS Review of Systems  Objective:  BP 136/78 (BP Location: Left Arm, Patient Position: Sitting, Cuff Size: Large)   Pulse 83   Temp 98.7 F (37.1 C) (Oral)   Resp 16   Ht '5\' 11"'$  (1.803 m)   Wt 248 lb (112.5 kg)   SpO2 93%   BMI 34.59 kg/m   BP Readings from Last 3 Encounters:  09/07/21 136/78  05/23/21 120/76  05/09/21 128/78    Wt Readings from Last 3 Encounters:  09/07/21 248 lb (112.5 kg)  05/09/21 256 lb 4 oz (116.2 kg)  04/04/21 255 lb (115.7 kg)    Physical Exam Cardiovascular:     Pulses:          Dorsalis pedis pulses are 1+ on the right side and 1+ on the left side.       Posterior tibial pulses are 1+ on the right side and 1+ on the left side.  Feet:     Right foot:     Skin  integrity: Skin integrity normal.     Toenail Condition: Right toenails are normal.     Left foot:     Skin integrity: Skin integrity normal.     Toenail Condition: Left toenails are normal.    Lab Results  Component Value Date   WBC 5.9 02/03/2021   HGB 14.5 02/03/2021   HCT 44.4 02/03/2021   PLT 400 02/03/2021   GLUCOSE 111 (H) 05/09/2021   CHOL 144 03/28/2021   TRIG 220 (H) 03/28/2021   HDL 58 03/28/2021   LDLDIRECT 145.0 08/26/2014   LDLCALC 51 03/28/2021   ALT 22 04/25/2021   AST 23 04/25/2021   NA 143 05/09/2021   K 3.7 05/09/2021   CL 108 05/09/2021   CREATININE 1.54 (H) 05/09/2021   BUN 23 05/09/2021   CO2 28 05/09/2021   TSH 3.50 05/09/2021   PSA 0.75 02/01/2021   INR 1.0 08/19/2018   HGBA1C 6.2 05/09/2021    DG Chest 2 View  Result Date: 02/01/2021 CLINICAL DATA:  Shortness of breath EXAM: CHEST - 2 VIEW COMPARISON:  08/19/2018 FINDINGS: The heart size and mediastinal contours are within normal limits. Both lungs are clear. The visualized skeletal structures are unremarkable. Mild chronic elevation right diaphragm. IMPRESSION: No active cardiopulmonary disease. Electronically Signed   By: Donavan Foil M.D.   On: 02/01/2021 19:48   ECHOCARDIOGRAM COMPLETE  Result Date: 02/02/2021    ECHOCARDIOGRAM REPORT   Patient Name:   James Sawyer Date of Exam: 02/02/2021 Medical Rec #:  [id number]     Height:       70.0 in Accession #:    [id number]    Weight:       260.0 lb Date of Birth:  [birth year]      BSA:          2.334 m Patient Age:    25 years      BP:           196/99 mmHg Patient Gender: M             HR:           59 bpm. Exam Location:  Inpatient Procedure: 2D Echo, Color Doppler, Cardiac Doppler and Intracardiac            Opacification Agent Indications:     I25.110 Atherosclerotic heart disease of native coronary artery                  with unstable angina pectoris  History:         Patient has prior history of Echocardiogram examinations, most                   recent 10/22/2014. CAD.  Sonographer:     Roseanna Rainbow RDCS Referring Phys:  [place] Diagnosing Phys: Dixie Dials MD IMPRESSIONS  1. Left ventricular ejection fraction, by estimation, is 55 to 60%. The left ventricle has normal function. The left ventricle has no regional wall motion abnormalities. There is moderate concentric left ventricular hypertrophy. Left ventricular diastolic parameters are consistent with Grade I diastolic dysfunction (impaired relaxation).  2. Right ventricular systolic function is normal. The right ventricular size is normal.  3. The mitral valve is normal in structure. Mild mitral valve regurgitation.  4. The aortic valve is tricuspid. There is moderate calcification of the aortic valve. There is mild thickening of the aortic valve. Aortic valve regurgitation is not visualized. Mild aortic valve sclerosis is present, with no evidence of aortic valve stenosis.  5. There is mild (Grade II) atheroma plaque involving the aortic root and ascending aorta.  6. The inferior vena cava is normal in size with <50% respiratory variability, suggesting right atrial pressure of 8 mmHg. FINDINGS  Left Ventricle: Left ventricular ejection fraction, by estimation, is 55 to 60%. The left ventricle has normal function. The left ventricle has no regional wall motion abnormalities. Definity contrast agent was given IV to delineate the left ventricular  endocardial borders. The left ventricular internal cavity size was normal in size. There is moderate concentric left ventricular hypertrophy. Left ventricular diastolic parameters are consistent with Grade I diastolic dysfunction (impaired relaxation). Right Ventricle: The right ventricular size is normal. No increase in right ventricular wall thickness. Right ventricular systolic function is normal. Left Atrium: Left atrial size was normal in size. Right Atrium: Right atrial size was not assessed. Pericardium: There is no evidence of pericardial  effusion. Mitral Valve: The mitral valve is normal in structure. There is mild thickening of the mitral valve leaflet(s). There is mild calcification of  the mitral valve leaflet(s). Mild mitral annular calcification. Mild mitral valve regurgitation. Tricuspid Valve: The tricuspid valve is normal in structure. Tricuspid valve regurgitation is trivial. Aortic Valve: The aortic valve is tricuspid. There is moderate calcification of the aortic valve. There is mild thickening of the aortic valve. There is mild aortic valve annular calcification. Aortic valve regurgitation is not visualized. Mild aortic valve sclerosis is present, with no evidence of aortic valve stenosis. Pulmonic Valve: The pulmonic valve was grossly normal. Pulmonic valve regurgitation is not visualized. Aorta: The aortic root is normal in size and structure. There is mild (Grade II) atheroma plaque involving the aortic root and ascending aorta. Venous: The inferior vena cava is normal in size with less than 50% respiratory variability, suggesting right atrial pressure of 8 mmHg. IAS/Shunts: The atrial septum is grossly normal.  LEFT VENTRICLE PLAX 2D LVIDd:         5.10 cm      Diastology LVIDs:         3.60 cm      LV e' medial:    3.83 cm/s LV PW:         1.60 cm      LV E/e' medial:  13.8 LV IVS:        1.60 cm      LV e' lateral:   5.68 cm/s LVOT diam:     2.20 cm      LV E/e' lateral: 9.3 LV SV:         73 LV SV Index:   31 LVOT Area:     3.80 cm                              3D Volume EF: LV Volumes (MOD)            3D EF:        40 % LV vol d, MOD A2C: 110.0 ml LV EDV:       186 ml LV vol d, MOD A4C: 76.4 ml  LV ESV:       112 ml LV vol s, MOD A2C: 59.8 ml  LV SV:        74 ml LV vol s, MOD A4C: 44.0 ml LV SV MOD A2C:     50.2 ml LV SV MOD A4C:     76.4 ml LV SV MOD BP:      42.8 ml RIGHT VENTRICLE RV S prime:     8.61 cm/s TAPSE (M-mode): 1.4 cm LEFT ATRIUM             Index        RIGHT ATRIUM           Index LA diam:        4.30 cm 1.84  cm/m   RA Area:     11.50 cm LA Vol (A2C):   61.3 ml 26.27 ml/m  RA Volume:   22.50 ml  9.64 ml/m LA Vol (A4C):   29.0 ml 12.43 ml/m LA Biplane Vol: 42.8 ml 18.34 ml/m  AORTIC VALVE LVOT Vmax:   113.00 cm/s LVOT Vmean:  65.100 cm/s LVOT VTI:    0.193 m  AORTA Ao Root diam: 3.70 cm Ao Asc diam:  3.80 cm MITRAL VALVE MV Area (PHT): 3.68 cm    SHUNTS MV Decel Time: 206 msec    Systemic VTI:  0.19 m MV E velocity: 52.70 cm/s  Systemic Diam: 2.20  cm MV A velocity: 67.10 cm/s MV E/A ratio:  0.79 Dixie Dials MD Electronically signed by Dixie Dials MD Signature Date/Time: 02/02/2021/12:48:56 PM    Final     Assessment & Plan:   There are no diagnoses linked to this encounter. I have discontinued Lona Kettle. Wire's nicotine and Belsomra. I am also having him maintain his buPROPion, DULoxetine, atorvastatin, ezetimibe, amLODipine, irbesartan, and hydrALAZINE.  No orders of the defined types were placed in this encounter.    Follow-up: No follow-ups on file.  Scarlette Calico, MD

## 2021-09-08 ENCOUNTER — Encounter: Payer: Self-pay | Admitting: Internal Medicine

## 2021-09-08 DIAGNOSIS — Z23 Encounter for immunization: Secondary | ICD-10-CM | POA: Insufficient documentation

## 2021-09-08 DIAGNOSIS — J410 Simple chronic bronchitis: Secondary | ICD-10-CM | POA: Insufficient documentation

## 2021-09-08 DIAGNOSIS — J449 Chronic obstructive pulmonary disease, unspecified: Secondary | ICD-10-CM | POA: Insufficient documentation

## 2021-09-08 MED ORDER — SHINGRIX 50 MCG/0.5ML IM SUSR: 0.5000 mL | Freq: Once | INTRAMUSCULAR | 1 refills | Status: AC

## 2021-09-08 MED ORDER — BOOSTRIX 5-2.5-18.5 LF-MCG/0.5 IM SUSP: 0.5000 mL | Freq: Once | INTRAMUSCULAR | 0 refills | Status: AC

## 2021-09-14 ENCOUNTER — Encounter: Payer: Self-pay | Admitting: Internal Medicine

## 2021-09-14 LAB — VITAMIN B1: Vitamin B1 (Thiamine): 9 nmol/L (ref 8–30)

## 2021-09-27 ENCOUNTER — Telehealth: Payer: Self-pay | Admitting: Internal Medicine

## 2021-09-27 NOTE — Telephone Encounter (Signed)
LVM for pt to rtn my call to schedule AWV with [facility].  

## 2021-10-09 ENCOUNTER — Ambulatory Visit (INDEPENDENT_AMBULATORY_CARE_PROVIDER_SITE_OTHER): Payer: Medicare HMO | Admitting: *Deleted

## 2021-10-09 DIAGNOSIS — E538 Deficiency of other specified B group vitamins: Secondary | ICD-10-CM | POA: Diagnosis not present

## 2021-10-09 MED ORDER — CYANOCOBALAMIN 1000 MCG/ML IJ SOLN
1000.0000 ug | Freq: Once | INTRAMUSCULAR | Status: AC
  Administered 2021-10-09: 1000 ug via INTRAMUSCULAR

## 2021-10-09 NOTE — Progress Notes (Signed)
Patient is here for b12 injection. Given in right deltoid. Patient tolerated well.

## 2021-11-02 ENCOUNTER — Telehealth: Payer: Self-pay | Admitting: Acute Care

## 2021-11-02 NOTE — Telephone Encounter (Signed)
Attempt to contact for scheduling annual LDCT.  Left VM and call back information

## 2021-11-09 ENCOUNTER — Ambulatory Visit: Payer: Medicare HMO

## 2021-11-09 ENCOUNTER — Ambulatory Visit (INDEPENDENT_AMBULATORY_CARE_PROVIDER_SITE_OTHER): Payer: Medicare HMO | Admitting: *Deleted

## 2021-11-09 DIAGNOSIS — E538 Deficiency of other specified B group vitamins: Secondary | ICD-10-CM

## 2021-11-09 MED ORDER — CYANOCOBALAMIN 1000 MCG/ML IJ SOLN
1000.0000 ug | Freq: Once | INTRAMUSCULAR | Status: AC
  Administered 2021-11-09: 1000 ug via INTRAMUSCULAR

## 2021-11-09 NOTE — Progress Notes (Signed)
Pls cosign for B12 inj../lmb  

## 2021-12-13 ENCOUNTER — Ambulatory Visit (INDEPENDENT_AMBULATORY_CARE_PROVIDER_SITE_OTHER): Payer: Medicare HMO

## 2021-12-13 DIAGNOSIS — E538 Deficiency of other specified B group vitamins: Secondary | ICD-10-CM

## 2021-12-13 MED ORDER — CYANOCOBALAMIN 1000 MCG/ML IJ SOLN
1000.0000 ug | Freq: Once | INTRAMUSCULAR | Status: AC
  Administered 2021-12-13: 1000 ug via INTRAMUSCULAR

## 2021-12-13 NOTE — Progress Notes (Signed)
After obtaining consent, and per orders of Dr. Ronnald Ramp, injection of B12 given on the right deltoid by Marrian Salvage. Patient instructed to report any adverse reaction to me immediately.

## 2022-01-12 ENCOUNTER — Ambulatory Visit: Payer: Medicare HMO

## 2022-01-30 NOTE — Patient Instructions (Signed)
Health Maintenance, Male Adopting a healthy lifestyle and getting preventive care are important in promoting health and wellness. Ask your health care provider about: The right schedule for you to have regular tests and exams. Things you can do on your own to prevent diseases and keep yourself healthy. What should I know about diet, weight, and exercise? Eat a healthy diet  Eat a diet that includes plenty of vegetables, fruits, low-fat dairy products, and lean protein. Do not eat a lot of foods that are high in solid fats, added sugars, or sodium. Maintain a healthy weight Body mass index (BMI) is a measurement that can be used to identify possible weight problems. It estimates body fat based on height and weight. Your health care provider can help determine your BMI and help you achieve or maintain a healthy weight. Get regular exercise Get regular exercise. This is one of the most important things you can do for your health. Most adults should: Exercise for at least 150 minutes each week. The exercise should increase your heart rate and make you sweat (moderate-intensity exercise). Do strengthening exercises at least twice a week. This is in addition to the moderate-intensity exercise. Spend less time sitting. Even light physical activity can be beneficial. Watch cholesterol and blood lipids Have your blood tested for lipids and cholesterol at 71 years of age, then have this test every 5 years. You may need to have your cholesterol levels checked more often if: Your lipid or cholesterol levels are high. You are older than 71 years of age. You are at high risk for heart disease. What should I know about cancer screening? Many types of cancers can be detected early and may often be prevented. Depending on your health history and family history, you may need to have cancer screening at various ages. This may include screening for: Colorectal cancer. Prostate cancer. Skin cancer. Lung  cancer. What should I know about heart disease, diabetes, and high blood pressure? Blood pressure and heart disease High blood pressure causes heart disease and increases the risk of stroke. This is more likely to develop in people who have high blood pressure readings or are overweight. Talk with your health care provider about your target blood pressure readings. Have your blood pressure checked: Every 3-5 years if you are 18-39 years of age. Every year if you are 40 years old or older. If you are between the ages of 65 and 75 and are a current or former smoker, ask your health care provider if you should have a one-time screening for abdominal aortic aneurysm (AAA). Diabetes Have regular diabetes screenings. This checks your fasting blood sugar level. Have the screening done: Once every three years after age 45 if you are at a normal weight and have a low risk for diabetes. More often and at a younger age if you are overweight or have a high risk for diabetes. What should I know about preventing infection? Hepatitis B If you have a higher risk for hepatitis B, you should be screened for this virus. Talk with your health care provider to find out if you are at risk for hepatitis B infection. Hepatitis C Blood testing is recommended for: Everyone born from 1945 through 1965. Anyone with known risk factors for hepatitis C. Sexually transmitted infections (STIs) You should be screened each year for STIs, including gonorrhea and chlamydia, if: You are sexually active and are younger than 71 years of age. You are older than 71 years of age and your   health care provider tells you that you are at risk for this type of infection. Your sexual activity has changed since you were last screened, and you are at increased risk for chlamydia or gonorrhea. Ask your health care provider if you are at risk. Ask your health care provider about whether you are at high risk for HIV. Your health care provider  may recommend a prescription medicine to help prevent HIV infection. If you choose to take medicine to prevent HIV, you should first get tested for HIV. You should then be tested every 3 months for as long as you are taking the medicine. Follow these instructions at home: Alcohol use Do not drink alcohol if your health care provider tells you not to drink. If you drink alcohol: Limit how much you have to 0-2 drinks a day. Know how much alcohol is in your drink. In the [place] one drink equals one 12 oz bottle of beer (355 mL), one 5 oz glass of Natika Geyer (148 mL), or one 1 oz glass of hard liquor (44 mL). Lifestyle Do not use any products that contain nicotine or tobacco. These products include cigarettes, chewing tobacco, and vaping devices, such as e-cigarettes. If you need help quitting, ask your health care provider. Do not use street drugs. Do not share needles. Ask your health care provider for help if you need support or information about quitting drugs. General instructions Schedule regular health, dental, and eye exams. Stay current with your vaccines. Tell your health care provider if: You often feel depressed. You have ever been abused or do not feel safe at home. Summary Adopting a healthy lifestyle and getting preventive care are important in promoting health and wellness. Follow your health care provider's instructions about healthy diet, exercising, and getting tested or screened for diseases. Follow your health care provider's instructions on monitoring your cholesterol and blood pressure. This information is not intended to replace advice given to you by your health care provider. Make sure you discuss any questions you have with your health care provider. Document Revised: 08/15/2020 Document Reviewed: 08/15/2020 Elsevier Patient Education  2023 [employer]  
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
## 2022-01-31 ENCOUNTER — Encounter: Payer: Self-pay | Admitting: Gastroenterology

## 2022-01-31 ENCOUNTER — Ambulatory Visit (INDEPENDENT_AMBULATORY_CARE_PROVIDER_SITE_OTHER): Payer: Medicare HMO | Admitting: *Deleted

## 2022-01-31 DIAGNOSIS — Z1211 Encounter for screening for malignant neoplasm of colon: Secondary | ICD-10-CM | POA: Diagnosis not present

## 2022-01-31 DIAGNOSIS — Z0000 Encounter for general adult medical examination without abnormal findings: Secondary | ICD-10-CM | POA: Diagnosis not present

## 2022-02-12 ENCOUNTER — Ambulatory Visit (INDEPENDENT_AMBULATORY_CARE_PROVIDER_SITE_OTHER): Payer: Medicare HMO

## 2022-02-12 DIAGNOSIS — E538 Deficiency of other specified B group vitamins: Secondary | ICD-10-CM | POA: Diagnosis not present

## 2022-02-12 MED ORDER — CYANOCOBALAMIN 1000 MCG/ML IJ SOLN
1000.0000 ug | Freq: Once | INTRAMUSCULAR | Status: AC
  Administered 2022-02-12: 1000 ug via INTRAMUSCULAR

## 2022-02-12 NOTE — Progress Notes (Signed)
Pt here for monthly B12 injection per Dr. Ronnald Ramp  B12 1015mg given IM, and pt tolerated injection well.  Next B12 injection scheduled for 12/7

## 2022-02-16 ENCOUNTER — Other Ambulatory Visit: Payer: Self-pay

## 2022-02-16 ENCOUNTER — Ambulatory Visit (AMBULATORY_SURGERY_CENTER): Payer: Self-pay

## 2022-02-16 VITALS — Ht 70.0 in | Wt 250.4 lb

## 2022-02-16 DIAGNOSIS — Z86010 Personal history of colonic polyps: Secondary | ICD-10-CM

## 2022-02-16 MED ORDER — PEG 3350-KCL-NA BICARB-NACL 420 G PO SOLR: 4000.0000 mL | Freq: Once | ORAL | 0 refills | Status: AC

## 2022-02-16 NOTE — Progress Notes (Signed)
Denies allergies to eggs or soy products. Denies complication of anesthesia or sedation. Denies use of weight loss medication. Denies use of O2.   Emmi instructions given for colonoscopy.   Pre-visit took longer than 30 minutes. Patient was not sure about his medication. I instructed the patient to please write down his medications and bring it with him to his procedure. Patient had some difficulty understanding the instructions however they were reviewed with him a number of times.

## 2022-02-22 ENCOUNTER — Other Ambulatory Visit: Payer: Self-pay | Admitting: Internal Medicine

## 2022-02-22 DIAGNOSIS — F331 Major depressive disorder, recurrent, moderate: Secondary | ICD-10-CM

## 2022-03-11 ENCOUNTER — Encounter: Payer: Self-pay | Admitting: Certified Registered Nurse Anesthetist

## 2022-03-13 ENCOUNTER — Encounter: Payer: Self-pay | Admitting: Gastroenterology

## 2022-03-14 ENCOUNTER — Ambulatory Visit: Payer: Medicare HMO

## 2022-03-15 ENCOUNTER — Ambulatory Visit: Payer: Medicare HMO | Admitting: Internal Medicine

## 2022-03-16 ENCOUNTER — Encounter: Payer: Self-pay | Admitting: Gastroenterology

## 2022-03-16 ENCOUNTER — Ambulatory Visit (AMBULATORY_SURGERY_CENTER): Payer: Medicare HMO | Admitting: Gastroenterology

## 2022-03-16 VITALS — BP 170/92 | HR 56 | Temp 97.5°F | Resp 17 | Ht 70.0 in | Wt 250.4 lb

## 2022-03-16 DIAGNOSIS — Z86010 Personal history of colonic polyps: Secondary | ICD-10-CM

## 2022-03-16 DIAGNOSIS — D123 Benign neoplasm of transverse colon: Secondary | ICD-10-CM

## 2022-03-16 DIAGNOSIS — I2510 Atherosclerotic heart disease of native coronary artery without angina pectoris: Secondary | ICD-10-CM | POA: Diagnosis not present

## 2022-03-16 DIAGNOSIS — J449 Chronic obstructive pulmonary disease, unspecified: Secondary | ICD-10-CM | POA: Diagnosis not present

## 2022-03-16 DIAGNOSIS — Z09 Encounter for follow-up examination after completed treatment for conditions other than malignant neoplasm: Secondary | ICD-10-CM | POA: Diagnosis not present

## 2022-03-16 MED ORDER — SODIUM CHLORIDE 0.9 % IV SOLN: 500.0000 mL | Freq: Once | INTRAVENOUS | Status: DC
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
## 2022-03-16 NOTE — Patient Instructions (Signed)
Handouts on polyps given to patient. Await pathology results. Resume previous diet and continue present medications. Repeat colonoscopy for surveillance will be determined based off of pathology results.  YOU HAD AN ENDOSCOPIC PROCEDURE TODAY AT THE [facility]:   Refer to the procedure report that was given to you for any specific questions about what was found during the examination.  If the procedure report does not answer your questions, please call your gastroenterologist to clarify.  If you requested that your care partner not be given the details of your procedure findings, then the procedure report has been included in a sealed envelope for you to review at your convenience later.  YOU SHOULD EXPECT: Some feelings of bloating in the abdomen. Passage of more gas than usual.  Walking can help get rid of the air that was put into your GI tract during the procedure and reduce the bloating. If you had a lower endoscopy (such as a colonoscopy or flexible sigmoidoscopy) you may notice spotting of blood in your stool or on the toilet paper. If you underwent a bowel prep for your procedure, you may not have a normal bowel movement for a few days.  Please Note:  You might notice some irritation and congestion in your nose or some drainage.  This is from the oxygen used during your procedure.  There is no need for concern and it should clear up in a day or so.  SYMPTOMS TO REPORT IMMEDIATELY:  Following lower endoscopy (colonoscopy or flexible sigmoidoscopy):  Excessive amounts of blood in the stool  Significant tenderness or worsening of abdominal pains  Swelling of the abdomen that is new, acute  Fever of 100F or higher  For urgent or emergent issues, a gastroenterologist can be reached at any hour by calling [phone]. Do not use MyChart messaging for urgent concerns.    DIET:  We do recommend a small meal at first, but then you may proceed to your regular diet.  Drink plenty  of fluids but you should avoid alcoholic beverages for 24 hours.  ACTIVITY:  You should plan to take it easy for the rest of today and you should NOT DRIVE or use heavy machinery until tomorrow (because of the sedation medicines used during the test).    FOLLOW UP: Our staff will call the number listed on your records the next business day following your procedure.  We will call around 7:15- 8:00 am to check on you and address any questions or concerns that you may have regarding the information given to you following your procedure. If we do not reach you, we will leave a message.     If any biopsies were taken you will be contacted by phone or by letter within the next 1-3 weeks.  Please call us at [phone] if you have not heard about the biopsies in 3 weeks.    SIGNATURES/CONFIDENTIALITY: You and/or your care partner have signed paperwork which will be entered into your electronic medical record.  These signatures attest to the fact that that the information above on your After Visit Summary has been reviewed and is understood.  Full responsibility of the confidentiality of this discharge information lies with you and/or your care-partner.  

## 2022-03-16 NOTE — Progress Notes (Signed)
Called to room to assist during endoscopic procedure.  Patient ID and intended procedure confirmed with present staff. Received instructions for my participation in the procedure from the performing physician.  

## 2022-03-16 NOTE — Progress Notes (Signed)
Pt's states no medical or surgical changes since previsit or office visit. 

## 2022-03-16 NOTE — Progress Notes (Signed)
Report given to PACU, vss 

## 2022-03-16 NOTE — Op Note (Signed)
[place] Patient Name: James Sawyer Procedure Date: 03/16/2022 11:03 AM MRN: [id number] Endoscopist: [place]. Loletha Carrow , MD, [id number] Age: 71 Referring MD:  Date of Birth: Sep 12, 1950 Gender: Male Account #: [id number] Procedure:                Colonoscopy Indications:              Surveillance: Personal history of adenomatous                            polyps on last colonoscopy 5 years ago Medicines:                Monitored Anesthesia Care Procedure:                Pre-Anesthesia Assessment:                           - Prior to the procedure, a History and Physical                            was performed, and patient medications and                            allergies were reviewed. The patient's tolerance of                            previous anesthesia was also reviewed. The risks                            and benefits of the procedure and the sedation                            options and risks were discussed with the patient.                            All questions were answered, and informed consent                            was obtained. Prior Anticoagulants: The patient has                            taken no anticoagulant or antiplatelet agents. ASA                            Grade Assessment: III - A patient with severe                            systemic disease. After reviewing the risks and                            benefits, the patient was deemed in satisfactory                            condition to undergo the procedure.  After obtaining informed consent, the colonoscope                            was passed under direct vision. Throughout the                            procedure, the patient's blood pressure, pulse, and                            oxygen saturations were monitored continuously. The                            CF [id number] #[id number] was introduced through the anus                            and advanced to the  the cecum, identified by                            appendiceal orifice and ileocecal valve. The                            colonoscopy was somewhat difficult due to a                            redundant colon and the patient's body habitus.                            Successful completion of the procedure was aided by                            using manual pressure and straightening and                            shortening the scope to obtain bowel loop                            reduction. The patient tolerated the procedure                            well. The quality of the bowel preparation was good                            except the cecum was fair. The ileocecal valve,                            appendiceal orifice, and rectum were photographed.                            The bowel preparation used was GoLYTELY via split                            dose instruction. Scope In: 11:18:40 AM Scope Out: 11:33:10 AM Scope Withdrawal Time: 0 hours 11 minutes 12 seconds  Total Procedure Duration: 0 hours 14 minutes 30 seconds  Findings:                 The perianal and digital rectal examinations were                            normal.                           Repeat examination of right colon under NBI                            performed.                           Two sessile polyps were found in the transverse                            colon. The polyps were 4 to 6 mm in size. These                            polyps were removed with a cold snare. Resection                            and retrieval were complete.                           The exam was otherwise without abnormality on                            direct and retroflexion views. Complications:            No immediate complications. Estimated Blood Loss:     Estimated blood loss was minimal. Impression:               - Two 4 to 6 mm polyps in the transverse colon,                            removed with a cold snare.  Resected and retrieved.                           - The examination was otherwise normal on direct                            and retroflexion views. Recommendation:           - Patient has a contact number available for                            emergencies. The signs and symptoms of potential                            delayed complications were discussed with the                            patient. Return to normal activities tomorrow.  Written discharge instructions were provided to the                            patient.                           - Resume previous diet.                           - Continue present medications.                           - Await pathology results.                           - Repeat colonoscopy is recommended for                            surveillance. The colonoscopy date will be                            determined after pathology results from today's                            exam become available for review. Steffanie Mingle L. Loletha Carrow, MD 03/16/2022 11:37:26 AM This report has been signed electronically.

## 2022-03-19 ENCOUNTER — Telehealth: Payer: Self-pay | Admitting: *Deleted

## 2022-03-19 NOTE — Telephone Encounter (Signed)
Attempted to call patient for their post-procedure follow-up call. No answer. Left voicemail.   

## 2022-03-20 ENCOUNTER — Encounter: Payer: Self-pay | Admitting: Gastroenterology

## 2022-05-01 ENCOUNTER — Other Ambulatory Visit: Payer: Self-pay | Admitting: Internal Medicine

## 2022-05-01 DIAGNOSIS — I10 Essential (primary) hypertension: Secondary | ICD-10-CM

## 2022-05-01 DIAGNOSIS — I2510 Atherosclerotic heart disease of native coronary artery without angina pectoris: Secondary | ICD-10-CM

## 2022-05-01 DIAGNOSIS — E785 Hyperlipidemia, unspecified: Secondary | ICD-10-CM

## 2022-08-21 ENCOUNTER — Telehealth: Payer: Self-pay | Admitting: Internal Medicine

## 2022-08-21 NOTE — Telephone Encounter (Signed)
Patient would like to know if further B12 shots are needed. He would like a call back at [phone].

## 2022-08-21 NOTE — Telephone Encounter (Signed)
Patient has been scheduled for next available appointment on 09/05/2022 for an OV.

## 2022-08-21 NOTE — Telephone Encounter (Signed)
LVM for pt to call back and schedule an appt. Pt is past due for 6 month f/u. LOV 09/07/21

## 2022-09-05 ENCOUNTER — Encounter: Payer: Self-pay | Admitting: Internal Medicine

## 2022-09-05 ENCOUNTER — Telehealth: Payer: Self-pay

## 2022-09-05 ENCOUNTER — Ambulatory Visit (INDEPENDENT_AMBULATORY_CARE_PROVIDER_SITE_OTHER): Payer: Medicare HMO | Admitting: Internal Medicine

## 2022-09-05 VITALS — BP 124/86 | HR 95 | Temp 97.9°F | Resp 16 | Ht 70.0 in | Wt 241.0 lb

## 2022-09-05 DIAGNOSIS — R351 Nocturia: Secondary | ICD-10-CM | POA: Diagnosis not present

## 2022-09-05 DIAGNOSIS — E538 Deficiency of other specified B group vitamins: Secondary | ICD-10-CM | POA: Diagnosis not present

## 2022-09-05 DIAGNOSIS — I2510 Atherosclerotic heart disease of native coronary artery without angina pectoris: Secondary | ICD-10-CM

## 2022-09-05 DIAGNOSIS — E519 Thiamine deficiency, unspecified: Secondary | ICD-10-CM | POA: Diagnosis not present

## 2022-09-05 DIAGNOSIS — Z0000 Encounter for general adult medical examination without abnormal findings: Secondary | ICD-10-CM

## 2022-09-05 DIAGNOSIS — N1831 Chronic kidney disease, stage 3a: Secondary | ICD-10-CM | POA: Diagnosis not present

## 2022-09-05 DIAGNOSIS — N401 Enlarged prostate with lower urinary tract symptoms: Secondary | ICD-10-CM | POA: Diagnosis not present

## 2022-09-05 DIAGNOSIS — E785 Hyperlipidemia, unspecified: Secondary | ICD-10-CM | POA: Diagnosis not present

## 2022-09-05 DIAGNOSIS — R9431 Abnormal electrocardiogram [ECG] [EKG]: Secondary | ICD-10-CM

## 2022-09-05 DIAGNOSIS — R7303 Prediabetes: Secondary | ICD-10-CM | POA: Diagnosis not present

## 2022-09-05 DIAGNOSIS — L989 Disorder of the skin and subcutaneous tissue, unspecified: Secondary | ICD-10-CM | POA: Insufficient documentation

## 2022-09-05 DIAGNOSIS — I10 Essential (primary) hypertension: Secondary | ICD-10-CM

## 2022-09-05 DIAGNOSIS — R7989 Other specified abnormal findings of blood chemistry: Secondary | ICD-10-CM | POA: Insufficient documentation

## 2022-09-05 DIAGNOSIS — I2583 Coronary atherosclerosis due to lipid rich plaque: Secondary | ICD-10-CM

## 2022-09-05 DIAGNOSIS — Z720 Tobacco use: Secondary | ICD-10-CM

## 2022-09-05 DIAGNOSIS — G4733 Obstructive sleep apnea (adult) (pediatric): Secondary | ICD-10-CM

## 2022-09-05 DIAGNOSIS — R0609 Other forms of dyspnea: Secondary | ICD-10-CM | POA: Insufficient documentation

## 2022-09-05 DIAGNOSIS — Z0001 Encounter for general adult medical examination with abnormal findings: Secondary | ICD-10-CM

## 2022-09-05 LAB — BASIC METABOLIC PANEL
BUN: 17 mg/dL (ref 6–23)
CO2: 31 mEq/L (ref 19–32)
Calcium: 9.6 mg/dL (ref 8.4–10.5)
Chloride: 105 mEq/L (ref 96–112)
Creatinine, Ser: 1.48 mg/dL (ref 0.40–1.50)
GFR: 47.21 mL/min — ABNORMAL LOW (ref >60.00)
Glucose, Bld: 99 mg/dL (ref 70–99)
Potassium: 4.5 mEq/L (ref 3.5–5.1)
Sodium: 144 mEq/L (ref 135–145)

## 2022-09-05 LAB — CBC WITH DIFFERENTIAL/PLATELET
Basophils Absolute: 0 10*3/uL (ref 0.0–0.1)
Basophils Relative: 0.5 % (ref 0.0–3.0)
Eosinophils Absolute: 0.1 10*3/uL (ref 0.0–0.7)
Eosinophils Relative: 1.6 % (ref 0.0–5.0)
HCT: 44.4 % (ref 39.0–52.0)
Hemoglobin: 14.3 g/dL (ref 13.0–17.0)
Lymphocytes Relative: 34.3 % (ref 12.0–46.0)
Lymphs Abs: 1.9 10*3/uL (ref 0.7–4.0)
MCHC: 32.3 g/dL (ref 30.0–36.0)
MCV: 95.7 fl (ref 78.0–100.0)
Monocytes Absolute: 0.4 10*3/uL (ref 0.1–1.0)
Monocytes Relative: 6.3 % (ref 3.0–12.0)
Neutro Abs: 3.2 10*3/uL (ref 1.4–7.7)
Neutrophils Relative %: 57.3 % (ref 43.0–77.0)
Platelets: 487 10*3/uL — ABNORMAL HIGH (ref 150.0–400.0)
RBC: 4.64 Mil/uL (ref 4.22–5.81)
RDW: 15.3 % (ref 11.5–15.5)
WBC: 5.7 10*3/uL (ref 4.0–10.5)

## 2022-09-05 LAB — URINALYSIS, ROUTINE W REFLEX MICROSCOPIC
Bilirubin Urine: NEGATIVE
Hgb urine dipstick: NEGATIVE
Ketones, ur: NEGATIVE
Leukocytes,Ua: NEGATIVE
Nitrite: NEGATIVE
RBC / HPF: NONE SEEN (ref 0–?)
Specific Gravity, Urine: 1.015 (ref 1.000–1.030)
Total Protein, Urine: NEGATIVE
Urine Glucose: NEGATIVE
Urobilinogen, UA: 0.2 (ref 0.0–1.0)
pH: 7.5 (ref 5.0–8.0)

## 2022-09-05 LAB — LIPID PANEL
Cholesterol: 184 mg/dL (ref 0–200)
HDL: 46 mg/dL (ref >39.00)
LDL Cholesterol: 117 mg/dL — ABNORMAL HIGH (ref 0–99)
NonHDL: 138.06
Total CHOL/HDL Ratio: 4
Triglycerides: 107 mg/dL (ref 0.0–149.0)
VLDL: 21.4 mg/dL (ref 0.0–40.0)

## 2022-09-05 LAB — HEPATIC FUNCTION PANEL
ALT: 20 U/L (ref 0–53)
AST: 17 U/L (ref 0–37)
Albumin: 4.2 g/dL (ref 3.5–5.2)
Alkaline Phosphatase: 75 U/L (ref 39–117)
Bilirubin, Direct: 0.1 mg/dL (ref 0.0–0.3)
Total Bilirubin: 0.6 mg/dL (ref 0.2–1.2)
Total Protein: 7.5 g/dL (ref 6.0–8.3)

## 2022-09-05 LAB — HEMOGLOBIN A1C: Hgb A1c MFr Bld: 5.8 % (ref 4.6–6.5)

## 2022-09-05 LAB — VITAMIN B12: Vitamin B-12: 281 pg/mL (ref 211–911)

## 2022-09-05 LAB — BRAIN NATRIURETIC PEPTIDE: Pro B Natriuretic peptide (BNP): 61 pg/mL (ref 0.0–100.0)

## 2022-09-05 LAB — TSH: TSH: 1.93 u[IU]/mL (ref 0.35–5.50)

## 2022-09-05 LAB — PSA: PSA: 0.62 ng/mL (ref 0.10–4.00)

## 2022-09-05 LAB — TROPONIN I (HIGH SENSITIVITY): High Sens Troponin I: 36 ng/L (ref 2–17)

## 2022-09-05 LAB — FOLATE: Folate: 5.8 ng/mL — ABNORMAL LOW (ref >5.9)

## 2022-09-05 MED ORDER — FOLIC ACID 1 MG PO TABS
1.0000 mg | ORAL_TABLET | Freq: Every day | ORAL | 1 refills | Status: DC
Start: 2022-09-05 — End: 2023-05-13

## 2022-09-05 NOTE — Telephone Encounter (Signed)
Critical Call - Pt has a high Troponin of 36

## 2022-09-05 NOTE — Progress Notes (Signed)
Subjective:  Patient ID: James Sawyer, male    DOB: 1950-09-16  Age: 72 y.o. MRN: [id number]  CC: Annual Exam, Hyperlipidemia, Hypertension, and Coronary Artery Disease   HPI BAER SNAY presents for a CPX and f/up ----  He complains of DOE with minimal exertion.  He denies chest pain, diaphoresis, palpitations, edema, or near syncope.  He has noticed a growing lesion around his right ankle for several months.  Outpatient Medications Prior to Visit  Medication Sig Dispense Refill   amLODipine (NORVASC) 10 MG tablet Take 1 tablet by mouth once daily 30 tablet 0   aspirin EC 81 MG tablet Take 81 mg by mouth daily. Swallow whole.     atorvastatin (LIPITOR) 80 MG tablet Take 1 tablet by mouth once daily 30 tablet 0   buPROPion (WELLBUTRIN XL) 150 MG 24 hr tablet Take 1 tablet by mouth once daily 90 tablet 0   DULoxetine (CYMBALTA) 60 MG capsule Take 1 capsule by mouth once daily 90 capsule 0   ezetimibe (ZETIA) 10 MG tablet Take 1 tablet by mouth once daily 30 tablet 0   hydrALAZINE (APRESOLINE) 50 MG tablet Take 1 tablet (50 mg total) by mouth 2 (two) times daily. 60 tablet 11   irbesartan (AVAPRO) 75 MG tablet Take 1 tablet (75 mg total) by mouth daily. 30 tablet 11   No facility-administered medications prior to visit.    ROS Review of Systems  Constitutional:  Positive for unexpected weight change. Negative for appetite change, chills, diaphoresis and fatigue.  HENT: Negative.    Respiratory:  Positive for apnea and shortness of breath (doe). Negative for cough and wheezing.   Cardiovascular:  Negative for chest pain, palpitations and leg swelling.  Gastrointestinal:  Negative for abdominal pain, constipation, diarrhea, nausea and vomiting.  Endocrine: Negative.   Genitourinary:  Positive for difficulty urinating. Negative for dysuria, frequency, hematuria, penile swelling and scrotal swelling.       Nocturia  Musculoskeletal: Negative.  Negative for arthralgias and myalgias.   Skin: Negative.  Negative for color change and rash.  Neurological:  Negative for dizziness, facial asymmetry and weakness.  Hematological:  Negative for adenopathy. Does not bruise/bleed easily.  Psychiatric/Behavioral: Negative.      Objective:  BP 124/86 (BP Location: Left Arm, Patient Position: Sitting, Cuff Size: Large)   Pulse 95   Temp 97.9 F (36.6 C) (Oral)   Resp 16   Ht 5\' 10"  (1.778 m)   Wt 241 lb (109.3 kg)   SpO2 97%   BMI 34.58 kg/m   BP Readings from Last 3 Encounters:  09/05/22 124/86  03/16/22 (!) 170/92  09/07/21 136/78    Wt Readings from Last 3 Encounters:  09/05/22 241 lb (109.3 kg)  03/16/22 250 lb 6.4 oz (113.6 kg)  02/16/22 250 lb 6.4 oz (113.6 kg)    Physical Exam Vitals reviewed. Exam conducted with a chaperone present Malva Limes).  HENT:     Nose: Nose normal.     Mouth/Throat:     Mouth: Mucous membranes are moist.  Eyes:     General: No scleral icterus.    Conjunctiva/sclera: Conjunctivae normal.  Cardiovascular:     Rate and Rhythm: Normal rate and regular rhythm.     Pulses:          Carotid pulses are 1+ on the right side and 1+ on the left side.      Radial pulses are 1+ on the right side and 1+ on the  left side.       Femoral pulses are 1+ on the right side and 1+ on the left side.      Popliteal pulses are 1+ on the right side and 1+ on the left side.       Dorsalis pedis pulses are 1+ on the right side and 1+ on the left side.       Posterior tibial pulses are 1+ on the right side and 1+ on the left side.     Heart sounds: No murmur heard.    No friction rub. No gallop.     Comments: EKG-  SR with 1st degree AV block RBBB, LAFB - new No LVH Pulmonary:     Effort: Pulmonary effort is normal.     Breath sounds: No stridor. No wheezing, rhonchi or rales.  Abdominal:     General: Abdomen is flat.     Palpations: There is no mass.     Tenderness: There is no abdominal tenderness. There is no guarding.     Hernia: No  hernia is present. There is no hernia in the left inguinal area or right inguinal area.  Genitourinary:    Pubic Area: No rash.      Penis: Normal and circumcised.      Testes: Normal.     Epididymis:     Right: Normal.     Left: Normal.     Prostate: Enlarged. Not tender and no nodules present.     Rectum: Normal. Guaiac result negative. No mass, tenderness, anal fissure, external hemorrhoid or internal hemorrhoid. Normal anal tone.  Musculoskeletal:        General: Normal range of motion.     Cervical back: Neck supple.     Right lower leg: No edema.     Left lower leg: No edema.  Lymphadenopathy:     Cervical: No cervical adenopathy.     Lower Body: No right inguinal adenopathy. No left inguinal adenopathy.  Skin:    Findings: Lesion present.  Neurological:     General: No focal deficit present.     Mental Status: He is alert. Mental status is at baseline.  Psychiatric:        Mood and Affect: Mood normal.     Lab Results  Component Value Date   WBC 5.7 09/05/2022   HGB 14.3 09/05/2022   HCT 44.4 09/05/2022   PLT 487.0 (H) 09/05/2022   GLUCOSE 99 09/05/2022   CHOL 184 09/05/2022   TRIG 107.0 09/05/2022   HDL 46.00 09/05/2022   LDLDIRECT 145.0 08/26/2014   LDLCALC 117 (H) 09/05/2022   ALT 20 09/05/2022   AST 17 09/05/2022   NA 144 09/05/2022   K 4.5 09/05/2022   CL 105 09/05/2022   CREATININE 1.48 09/05/2022   BUN 17 09/05/2022   CO2 31 09/05/2022   TSH 1.93 09/05/2022   PSA 0.62 09/05/2022   INR 1.0 08/19/2018   HGBA1C 5.8 09/05/2022    ECHOCARDIOGRAM COMPLETE  Result Date: 02/02/2021    ECHOCARDIOGRAM REPORT   Patient Name:   James Sawyer Date of Exam: 02/02/2021 Medical Rec #:  [id number]     Height:       70.0 in Accession #:    [id number]    Weight:       260.0 lb Date of Birth:  04-19-1950      BSA:          2.334 m Patient Age:    36 years  BP:           196/99 mmHg Patient Gender: M             HR:           59 bpm. Exam Location:  Inpatient  Procedure: 2D Echo, Color Doppler, Cardiac Doppler and Intracardiac            Opacification Agent Indications:     I25.110 Atherosclerotic heart disease of native coronary artery                  with unstable angina pectoris  History:         Patient has prior history of Echocardiogram examinations, most                  recent 10/22/2014. CAD.  Sonographer:     Sheralyn Boatman RDCS Referring Phys:  [id number] MICHAEL E NORINS Diagnosing Phys: Orpah Cobb MD IMPRESSIONS  1. Left ventricular ejection fraction, by estimation, is 55 to 60%. The left ventricle has normal function. The left ventricle has no regional wall motion abnormalities. There is moderate concentric left ventricular hypertrophy. Left ventricular diastolic parameters are consistent with Grade I diastolic dysfunction (impaired relaxation).  2. Right ventricular systolic function is normal. The right ventricular size is normal.  3. The mitral valve is normal in structure. Mild mitral valve regurgitation.  4. The aortic valve is tricuspid. There is moderate calcification of the aortic valve. There is mild thickening of the aortic valve. Aortic valve regurgitation is not visualized. Mild aortic valve sclerosis is present, with no evidence of aortic valve stenosis.  5. There is mild (Grade II) atheroma plaque involving the aortic root and ascending aorta.  6. The inferior vena cava is normal in size with <50% respiratory variability, suggesting right atrial pressure of 8 mmHg. FINDINGS  Left Ventricle: Left ventricular ejection fraction, by estimation, is 55 to 60%. The left ventricle has normal function. The left ventricle has no regional wall motion abnormalities. Definity contrast agent was given IV to delineate the left ventricular  endocardial borders. The left ventricular internal cavity size was normal in size. There is moderate concentric left ventricular hypertrophy. Left ventricular diastolic parameters are consistent with Grade I diastolic dysfunction  (impaired relaxation). Right Ventricle: The right ventricular size is normal. No increase in right ventricular wall thickness. Right ventricular systolic function is normal. Left Atrium: Left atrial size was normal in size. Right Atrium: Right atrial size was not assessed. Pericardium: There is no evidence of pericardial effusion. Mitral Valve: The mitral valve is normal in structure. There is mild thickening of the mitral valve leaflet(s). There is mild calcification of the mitral valve leaflet(s). Mild mitral annular calcification. Mild mitral valve regurgitation. Tricuspid Valve: The tricuspid valve is normal in structure. Tricuspid valve regurgitation is trivial. Aortic Valve: The aortic valve is tricuspid. There is moderate calcification of the aortic valve. There is mild thickening of the aortic valve. There is mild aortic valve annular calcification. Aortic valve regurgitation is not visualized. Mild aortic valve sclerosis is present, with no evidence of aortic valve stenosis. Pulmonic Valve: The pulmonic valve was grossly normal. Pulmonic valve regurgitation is not visualized. Aorta: The aortic root is normal in size and structure. There is mild (Grade II) atheroma plaque involving the aortic root and ascending aorta. Venous: The inferior vena cava is normal in size with less than 50% respiratory variability, suggesting right atrial pressure of 8 mmHg. IAS/Shunts: The atrial septum is grossly normal.  LEFT VENTRICLE PLAX 2D LVIDd:         5.10 cm      Diastology LVIDs:         3.60 cm      LV e' medial:    3.83 cm/s LV PW:         1.60 cm      LV E/e' medial:  13.8 LV IVS:        1.60 cm      LV e' lateral:   5.68 cm/s LVOT diam:     2.20 cm      LV E/e' lateral: 9.3 LV SV:         73 LV SV Index:   31 LVOT Area:     3.80 cm                              3D Volume EF: LV Volumes (MOD)            3D EF:        40 % LV vol d, MOD A2C: 110.0 ml LV EDV:       186 ml LV vol d, MOD A4C: 76.4 ml  LV ESV:       112  ml LV vol s, MOD A2C: 59.8 ml  LV SV:        74 ml LV vol s, MOD A4C: 44.0 ml LV SV MOD A2C:     50.2 ml LV SV MOD A4C:     76.4 ml LV SV MOD BP:      42.8 ml RIGHT VENTRICLE RV S prime:     8.61 cm/s TAPSE (M-mode): 1.4 cm LEFT ATRIUM             Index        RIGHT ATRIUM           Index LA diam:        4.30 cm 1.84 cm/m   RA Area:     11.50 cm LA Vol (A2C):   61.3 ml 26.27 ml/m  RA Volume:   22.50 ml  9.64 ml/m LA Vol (A4C):   29.0 ml 12.43 ml/m LA Biplane Vol: 42.8 ml 18.34 ml/m  AORTIC VALVE LVOT Vmax:   113.00 cm/s LVOT Vmean:  65.100 cm/s LVOT VTI:    0.193 m  AORTA Ao Root diam: 3.70 cm Ao Asc diam:  3.80 cm MITRAL VALVE MV Area (PHT): 3.68 cm    SHUNTS MV Decel Time: 206 msec    Systemic VTI:  0.19 m MV E velocity: 52.70 cm/s  Systemic Diam: 2.20 cm MV A velocity: 67.10 cm/s MV E/A ratio:  0.79 Orpah Cobb MD Electronically signed by Orpah Cobb MD Signature Date/Time: 02/02/2021/12:48:56 PM    Final    DG Chest 2 View  Result Date: 02/01/2021 CLINICAL DATA:  Shortness of breath EXAM: CHEST - 2 VIEW COMPARISON:  08/19/2018 FINDINGS: The heart size and mediastinal contours are within normal limits. Both lungs are clear. The visualized skeletal structures are unremarkable. Mild chronic elevation right diaphragm. IMPRESSION: No active cardiopulmonary disease. Electronically Signed   By: Jasmine Pang M.D.   On: 02/01/2021 19:48    No results found.   Assessment & Plan:   BPH associated with nocturia -     PSA; Future -     Urinalysis, Routine w reflex microscopic; Future  Coronary artery disease due to lipid rich plaque- He  complains of DOE with minimal exertion.  His troponin is mildly elevated but it is lower than it was previously.  Will evaluate with a myocardial perfusion imaging. I have asked him to start taking an aspirin daily. -     MYOCARDIAL PERFUSION IMAGING; Future -     Cardiac Stress Test: Informed Consent Details: Physician/Practitioner Attestation; Transcribe to  consent form and obtain patient signature; Future  Stage 3a chronic kidney disease (HCC)-- His renal function is stable. -     Urinalysis, Routine w reflex microscopic; Future -     Basic metabolic panel; Future  Encounter for general adult medical examination with abnormal findings- Exam completed, labs reviewed, vaccines reviewed, cancer screenings are up-to-date, patient education was given.  Essential hypertension- His blood pressure is well-controlled. -     TSH; Future -     EKG 12-Lead  Hyperlipidemia with target LDL less than 100- He has not achieved his LDL goal.  Will restart the statin. -     Lipid panel; Future -     TSH; Future -     Hepatic function panel; Future  Morbid obesity (HCC) -     TSH; Future -     Hepatic function panel; Future  Prediabetes -     Hemoglobin A1c; Future  Thiamine deficiency -     CBC with Differential/Platelet; Future  Tobacco abuse -     Ambulatory Referral for Lung Cancer Scre  OSA (obstructive sleep apnea)  B12 deficiency -     Vitamin B12; Future -     Folate; Future  DOE (dyspnea on exertion) -     Brain natriuretic peptide; Future -     Troponin I (High Sensitivity); Future -     MYOCARDIAL PERFUSION IMAGING; Future  Skin lesion of lower extremity -     Ambulatory referral to Plastic Surgery  Elevated troponin -     Cardiac Stress Test: Informed Consent Details: Physician/Practitioner Attestation; Transcribe to consent form and obtain patient signature; Future  Folate deficiency -     Folic Acid; Take 1 tablet (1 mg total) by mouth daily.  Dispense: 90 tablet; Refill: 1  Abnormal electrocardiogram (ECG) (EKG) -     MYOCARDIAL PERFUSION IMAGING; Future -     Cardiac Stress Test: Informed Consent Details: Physician/Practitioner Attestation; Transcribe to consent form and obtain patient signature; Future     Follow-up: Return in about 3 months (around 12/06/2022).  Sanda Linger, MD

## 2022-09-05 NOTE — Patient Instructions (Signed)
Health Maintenance, Male Adopting a healthy lifestyle and getting preventive care are important in promoting health and wellness. Ask your health care provider about: The right schedule for you to have regular tests and exams. Things you can do on your own to prevent diseases and keep yourself healthy. What should I know about diet, weight, and exercise? Eat a healthy diet  Eat a diet that includes plenty of vegetables, fruits, low-fat dairy products, and lean protein. Do not eat a lot of foods that are high in solid fats, added sugars, or sodium. Maintain a healthy weight Body mass index (BMI) is a measurement that can be used to identify possible weight problems. It estimates body fat based on height and weight. Your health care provider can help determine your BMI and help you achieve or maintain a healthy weight. Get regular exercise Get regular exercise. This is one of the most important things you can do for your health. Most adults should: Exercise for at least 150 minutes each week. The exercise should increase your heart rate and make you sweat (moderate-intensity exercise). Do strengthening exercises at least twice a week. This is in addition to the moderate-intensity exercise. Spend less time sitting. Even light physical activity can be beneficial. Watch cholesterol and blood lipids Have your blood tested for lipids and cholesterol at 72 years of age, then have this test every 5 years. You may need to have your cholesterol levels checked more often if: Your lipid or cholesterol levels are high. You are older than 72 years of age. You are at high risk for heart disease. What should I know about cancer screening? Many types of cancers can be detected early and may often be prevented. Depending on your health history and family history, you may need to have cancer screening at various ages. This may include screening for: Colorectal cancer. Prostate cancer. Skin cancer. Lung  cancer. What should I know about heart disease, diabetes, and high blood pressure? Blood pressure and heart disease High blood pressure causes heart disease and increases the risk of stroke. This is more likely to develop in people who have high blood pressure readings or are overweight. Talk with your health care provider about your target blood pressure readings. Have your blood pressure checked: Every 3-5 years if you are 18-39 years of age. Every year if you are 40 years old or older. If you are between the ages of 65 and 75 and are a current or former smoker, ask your health care provider if you should have a one-time screening for abdominal aortic aneurysm (AAA). Diabetes Have regular diabetes screenings. This checks your fasting blood sugar level. Have the screening done: Once every three years after age 45 if you are at a normal weight and have a low risk for diabetes. More often and at a younger age if you are overweight or have a high risk for diabetes. What should I know about preventing infection? Hepatitis B If you have a higher risk for hepatitis B, you should be screened for this virus. Talk with your health care provider to find out if you are at risk for hepatitis B infection. Hepatitis C Blood testing is recommended for: Everyone born from 1945 through 1965. Anyone with known risk factors for hepatitis C. Sexually transmitted infections (STIs) You should be screened each year for STIs, including gonorrhea and chlamydia, if: You are sexually active and are younger than 72 years of age. You are older than 72 years of age and your   health care provider tells you that you are at risk for this type of infection. Your sexual activity has changed since you were last screened, and you are at increased risk for chlamydia or gonorrhea. Ask your health care provider if you are at risk. Ask your health care provider about whether you are at high risk for HIV. Your health care provider  may recommend a prescription medicine to help prevent HIV infection. If you choose to take medicine to prevent HIV, you should first get tested for HIV. You should then be tested every 3 months for as long as you are taking the medicine. Follow these instructions at home: Alcohol use Do not drink alcohol if your health care provider tells you not to drink. If you drink alcohol: Limit how much you have to 0-2 drinks a day. Know how much alcohol is in your drink. In the [place] one drink equals one 12 oz bottle of beer (355 mL), one 5 oz glass of wine (148 mL), or one 1 oz glass of hard liquor (44 mL). Lifestyle Do not use any products that contain nicotine or tobacco. These products include cigarettes, chewing tobacco, and vaping devices, such as e-cigarettes. If you need help quitting, ask your health care provider. Do not use street drugs. Do not share needles. Ask your health care provider for help if you need support or information about quitting drugs. General instructions Schedule regular health, dental, and eye exams. Stay current with your vaccines. Tell your health care provider if: You often feel depressed. You have ever been abused or do not feel safe at home. Summary Adopting a healthy lifestyle and getting preventive care are important in promoting health and wellness. Follow your health care provider's instructions about healthy diet, exercising, and getting tested or screened for diseases. Follow your health care provider's instructions on monitoring your cholesterol and blood pressure. This information is not intended to replace advice given to you by your health care provider. Make sure you discuss any questions you have with your health care provider. Document Revised: 08/15/2020 Document Reviewed: 08/15/2020 Elsevier Patient Education  2024 [employer]vier Inc.  

## 2022-09-08 ENCOUNTER — Encounter: Payer: Self-pay | Admitting: Internal Medicine

## 2022-09-09 MED ORDER — ATORVASTATIN CALCIUM 80 MG PO TABS: 80.0000 mg | ORAL_TABLET | Freq: Every day | ORAL | 1 refills | Status: DC

## 2022-09-09 MED ORDER — ASPIRIN 81 MG PO TBEC: 81.0000 mg | DELAYED_RELEASE_TABLET | Freq: Every day | ORAL | 1 refills | Status: DC

## 2022-10-10 ENCOUNTER — Ambulatory Visit: Payer: Medicare HMO | Admitting: Plastic Surgery

## 2022-10-10 ENCOUNTER — Encounter: Payer: Self-pay | Admitting: Plastic Surgery

## 2022-10-10 VITALS — Ht 72.0 in | Wt 249.0 lb

## 2022-10-10 DIAGNOSIS — S91001A Unspecified open wound, right ankle, initial encounter: Secondary | ICD-10-CM

## 2022-10-10 DIAGNOSIS — L989 Disorder of the skin and subcutaneous tissue, unspecified: Secondary | ICD-10-CM
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
## 2022-10-26 ENCOUNTER — Telehealth: Payer: Self-pay | Admitting: *Deleted

## 2022-10-26 NOTE — Telephone Encounter (Signed)
I attempted to contact patient by telephone but was unsuccessful. According to the patient's chart they are due for follow up  with [facility]. I have left a HIPAA compliant message advising the patient to contact [facility] at [phone]. I will continue to follow up with the patient to make sure this appointment is scheduled.

## 2022-11-27 ENCOUNTER — Other Ambulatory Visit: Payer: Self-pay | Admitting: Internal Medicine

## 2022-11-27 DIAGNOSIS — I10 Essential (primary) hypertension: Secondary | ICD-10-CM

## 2022-12-06 ENCOUNTER — Ambulatory Visit: Payer: Medicare HMO | Admitting: Internal Medicine

## 2023-01-08 ENCOUNTER — Ambulatory Visit: Payer: Medicare HMO | Admitting: Internal Medicine

## 2023-01-08 ENCOUNTER — Encounter: Payer: Self-pay | Admitting: Internal Medicine

## 2023-01-08 VITALS — BP 134/76 | HR 71 | Temp 98.1°F | Resp 16 | Ht 72.0 in | Wt 243.0 lb

## 2023-01-08 DIAGNOSIS — E876 Hypokalemia: Secondary | ICD-10-CM

## 2023-01-08 DIAGNOSIS — D75839 Thrombocytosis, unspecified: Secondary | ICD-10-CM

## 2023-01-08 DIAGNOSIS — I10 Essential (primary) hypertension: Secondary | ICD-10-CM

## 2023-01-08 DIAGNOSIS — Z23 Encounter for immunization: Secondary | ICD-10-CM

## 2023-01-08 DIAGNOSIS — M67431 Ganglion, right wrist: Secondary | ICD-10-CM

## 2023-01-08 DIAGNOSIS — N1832 Chronic kidney disease, stage 3b: Secondary | ICD-10-CM | POA: Diagnosis not present

## 2023-01-08 DIAGNOSIS — Z720 Tobacco use: Secondary | ICD-10-CM

## 2023-01-08 LAB — CBC WITH DIFFERENTIAL/PLATELET
Basophils Absolute: 0 10*3/uL (ref 0.0–0.1)
Basophils Relative: 0.2 % (ref 0.0–3.0)
Eosinophils Absolute: 0.1 10*3/uL (ref 0.0–0.7)
Eosinophils Relative: 1.9 % (ref 0.0–5.0)
HCT: 44.3 % (ref 39.0–52.0)
Hemoglobin: 14.4 g/dL (ref 13.0–17.0)
Lymphocytes Relative: 35.4 % (ref 12.0–46.0)
Lymphs Abs: 2 10*3/uL (ref 0.7–4.0)
MCHC: 32.4 g/dL (ref 30.0–36.0)
MCV: 94.8 fL (ref 78.0–100.0)
Monocytes Absolute: 0.3 10*3/uL (ref 0.1–1.0)
Monocytes Relative: 5 % (ref 3.0–12.0)
Neutro Abs: 3.3 10*3/uL (ref 1.4–7.7)
Neutrophils Relative %: 57.5 % (ref 43.0–77.0)
Platelets: 498 10*3/uL — ABNORMAL HIGH (ref 150.0–400.0)
RBC: 4.68 Mil/uL (ref 4.22–5.81)
RDW: 15.6 % — ABNORMAL HIGH (ref 11.5–15.5)
WBC: 5.7 10*3/uL (ref 4.0–10.5)

## 2023-01-08 LAB — BASIC METABOLIC PANEL
BUN: 24 mg/dL — ABNORMAL HIGH (ref 6–23)
CO2: 29 meq/L (ref 19–32)
Calcium: 9.4 mg/dL (ref 8.4–10.5)
Chloride: 104 meq/L (ref 96–112)
Creatinine, Ser: 2.16 mg/dL — ABNORMAL HIGH (ref 0.40–1.50)
GFR: 29.92 mL/min — ABNORMAL LOW (ref >60.00)
Glucose, Bld: 136 mg/dL — ABNORMAL HIGH (ref 70–99)
Potassium: 3.3 meq/L — ABNORMAL LOW (ref 3.5–5.1)
Sodium: 142 meq/L (ref 135–145)

## 2023-01-08 MED ORDER — POTASSIUM CHLORIDE ER 10 MEQ PO TBCR: 10.0000 meq | EXTENDED_RELEASE_TABLET | Freq: Two times a day (BID) | ORAL | 0 refills | Status: DC

## 2023-01-08 NOTE — Progress Notes (Unsigned)
Subjective:  Patient ID: James Sawyer, male    DOB: 1951-03-23  Age: 72 y.o. MRN: [id number]  CC: Hypertension   HPI James Sawyer presents for f/up -----  Discussed the use of AI scribe software for clinical note transcription with the patient, who gave verbal consent to proceed.  History of Present Illness   The patient presents with a primary complaint of fatigue and lack of energy. They deny experiencing any chest pain, shortness of breath, dizziness, or lightheadedness. The patient also denies any symptoms of depression or anxiety and has no thoughts of self-harm or harm to others.  The patient has a history of smoking and admits to a significant reduction in smoking habits, but has not quit entirely. Despite this, they deny any smoking-related symptoms such as coughing or wheezing.  The patient also reports a lack of physical activity, attributing this to laziness. They mention a history of regular running in their younger years, but have since ceased this activity.       Outpatient Medications Prior to Visit  Medication Sig Dispense Refill   amLODipine (NORVASC) 10 MG tablet Take 1 tablet by mouth once daily 30 tablet 0   aspirin EC 81 MG tablet Take 1 tablet (81 mg total) by mouth daily. Swallow whole. 90 tablet 1   atorvastatin (LIPITOR) 80 MG tablet Take 1 tablet (80 mg total) by mouth daily. 90 tablet 1   buPROPion (WELLBUTRIN XL) 150 MG 24 hr tablet Take 1 tablet by mouth once daily 90 tablet 0   DULoxetine (CYMBALTA) 60 MG capsule Take 1 capsule by mouth once daily 90 capsule 0   ezetimibe (ZETIA) 10 MG tablet Take 1 tablet by mouth once daily 30 tablet 0   folic acid (FOLVITE) 1 MG tablet Take 1 tablet (1 mg total) by mouth daily. 90 tablet 1   No facility-administered medications prior to visit.    ROS Review of Systems  Constitutional:  Negative for chills, diaphoresis, fatigue and fever.  HENT: Negative.    Eyes: Negative.   Respiratory:  Negative for cough,  chest tightness, shortness of breath and wheezing.   Cardiovascular:  Negative for chest pain, palpitations and leg swelling.  Gastrointestinal:  Negative for abdominal pain, constipation, diarrhea, nausea and vomiting.  Endocrine: Negative.   Genitourinary: Negative.  Negative for difficulty urinating and dysuria.  Musculoskeletal:  Negative for arthralgias, back pain, myalgias and neck pain.  Skin:  Negative for color change, pallor and rash.  Neurological: Negative.  Negative for weakness.  Hematological:  Negative for adenopathy. Does not bruise/bleed easily.  Psychiatric/Behavioral:  Positive for confusion and decreased concentration. Negative for sleep disturbance and suicidal ideas. The patient is not nervous/anxious.     Objective:  BP 134/76 (BP Location: Left Arm, Patient Position: Sitting, Cuff Size: Large)   Pulse 71   Temp 98.1 F (36.7 C) (Oral)   Resp 16   Ht 6' (1.829 m)   Wt 243 lb (110.2 kg)   SpO2 93%   BMI 32.96 kg/m   BP Readings from Last 3 Encounters:  01/08/23 134/76  09/05/22 124/86  03/16/22 (!) 170/92    Wt Readings from Last 3 Encounters:  01/08/23 243 lb (110.2 kg)  10/10/22 249 lb (112.9 kg)  09/05/22 241 lb (109.3 kg)    Physical Exam Vitals reviewed.  Constitutional:      Appearance: He is obese.  HENT:     Mouth/Throat:     Mouth: Mucous membranes are moist.  Eyes:     General: No scleral icterus.    Conjunctiva/sclera: Conjunctivae normal.  Cardiovascular:     Rate and Rhythm: Normal rate and regular rhythm.     Heart sounds: No murmur heard.    No gallop.  Pulmonary:     Effort: Pulmonary effort is normal.     Breath sounds: No stridor. No wheezing, rhonchi or rales.  Abdominal:     General: Abdomen is protuberant. Bowel sounds are normal. There is no distension.     Palpations: Abdomen is soft. There is no hepatomegaly, splenomegaly or mass.     Tenderness: There is no abdominal tenderness. There is no guarding.   Musculoskeletal:        General: Normal range of motion.     Cervical back: Neck supple.     Right lower leg: No edema.     Left lower leg: No edema.  Lymphadenopathy:     Cervical: No cervical adenopathy.  Skin:    General: Skin is warm and dry.  Neurological:     General: No focal deficit present.     Mental Status: He is alert. Mental status is at baseline.  Psychiatric:        Mood and Affect: Mood normal.        Behavior: Behavior normal.     Lab Results  Component Value Date   WBC 5.7 01/08/2023   HGB 14.4 01/08/2023   HCT 44.3 01/08/2023   PLT 498.0 (H) 01/08/2023   GLUCOSE 136 (H) 01/08/2023   CHOL 184 09/05/2022   TRIG 107.0 09/05/2022   HDL 46.00 09/05/2022   LDLDIRECT 145.0 08/26/2014   LDLCALC 117 (H) 09/05/2022   ALT 20 09/05/2022   AST 17 09/05/2022   NA 142 01/08/2023   K 3.3 (L) 01/08/2023   CL 104 01/08/2023   CREATININE 2.16 (H) 01/08/2023   BUN 24 (H) 01/08/2023   CO2 29 01/08/2023   TSH 1.93 09/05/2022   PSA 0.62 09/05/2022   INR 1.0 08/19/2018   HGBA1C 5.8 09/05/2022    ECHOCARDIOGRAM COMPLETE  Result Date: 02/02/2021    ECHOCARDIOGRAM REPORT   Patient Name:   James Sawyer Date of Exam: 02/02/2021 Medical Rec #:  [id number]     Height:       70.0 in Accession #:    [id number]    Weight:       260.0 lb Date of Birth:  Mar 09, 1951      BSA:          2.334 m Patient Age:    70 years      BP:           196/99 mmHg Patient Gender: M             HR:           59 bpm. Exam Location:  Inpatient Procedure: 2D Echo, Color Doppler, Cardiac Doppler and Intracardiac            Opacification Agent Indications:     I25.110 Atherosclerotic heart disease of native coronary artery                  with unstable angina pectoris  History:         Patient has prior history of Echocardiogram examinations, most                  recent 10/22/2014. CAD.  Sonographer:     Sheralyn Boatman RDCS Referring Phys:  [phone]  Eyes:     General: No scleral icterus.    Conjunctiva/sclera: Conjunctivae normal.  Cardiovascular:     Rate and Rhythm: Normal rate and regular rhythm.     Heart sounds: No murmur heard.    No gallop.  Pulmonary:     Effort: Pulmonary effort is normal.     Breath sounds: No stridor. No wheezing, rhonchi or rales.  Abdominal:     General: Abdomen is protuberant. Bowel sounds are normal. There is no distension.     Palpations: Abdomen is soft. There is no hepatomegaly, splenomegaly or mass.     Tenderness: There is no abdominal tenderness. There is no guarding.   Musculoskeletal:        General: Normal range of motion.     Cervical back: Neck supple.     Right lower leg: No edema.     Left lower leg: No edema.  Lymphadenopathy:     Cervical: No cervical adenopathy.  Skin:    General: Skin is warm and dry.  Neurological:     General: No focal deficit present.     Mental Status: He is alert. Mental status is at baseline.  Psychiatric:        Mood and Affect: Mood normal.        Behavior: Behavior normal.     Lab Results  Component Value Date   WBC 5.7 01/08/2023   HGB 14.4 01/08/2023   HCT 44.3 01/08/2023   PLT 498.0 (H) 01/08/2023   GLUCOSE 136 (H) 01/08/2023   CHOL 184 09/05/2022   TRIG 107.0 09/05/2022   HDL 46.00 09/05/2022   LDLDIRECT 145.0 08/26/2014   LDLCALC 117 (H) 09/05/2022   ALT 20 09/05/2022   AST 17 09/05/2022   NA 142 01/08/2023   K 3.3 (L) 01/08/2023   CL 104 01/08/2023   CREATININE 2.16 (H) 01/08/2023   BUN 24 (H) 01/08/2023   CO2 29 01/08/2023   TSH 1.93 09/05/2022   PSA 0.62 09/05/2022   INR 1.0 08/19/2018   HGBA1C 5.8 09/05/2022    ECHOCARDIOGRAM COMPLETE  Result Date: 02/02/2021    ECHOCARDIOGRAM REPORT   Patient Name:   James Sawyer Date of Exam: 02/02/2021 Medical Rec #:  [id number]     Height:       70.0 in Accession #:    [id number]    Weight:       260.0 lb Date of Birth:  Mar 09, 1951      BSA:          2.334 m Patient Age:    70 years      BP:           196/99 mmHg Patient Gender: M             HR:           59 bpm. Exam Location:  Inpatient Procedure: 2D Echo, Color Doppler, Cardiac Doppler and Intracardiac            Opacification Agent Indications:     I25.110 Atherosclerotic heart disease of native coronary artery                  with unstable angina pectoris  History:         Patient has prior history of Echocardiogram examinations, most                  recent 10/22/2014. CAD.  Sonographer:     Sheralyn Boatman RDCS Referring Phys:  [phone]  Subjective:  Patient ID: James Sawyer, male    DOB: 1951-03-23  Age: 72 y.o. MRN: [id number]  CC: Hypertension   HPI James Sawyer presents for f/up -----  Discussed the use of AI scribe software for clinical note transcription with the patient, who gave verbal consent to proceed.  History of Present Illness   The patient presents with a primary complaint of fatigue and lack of energy. They deny experiencing any chest pain, shortness of breath, dizziness, or lightheadedness. The patient also denies any symptoms of depression or anxiety and has no thoughts of self-harm or harm to others.  The patient has a history of smoking and admits to a significant reduction in smoking habits, but has not quit entirely. Despite this, they deny any smoking-related symptoms such as coughing or wheezing.  The patient also reports a lack of physical activity, attributing this to laziness. They mention a history of regular running in their younger years, but have since ceased this activity.       Outpatient Medications Prior to Visit  Medication Sig Dispense Refill   amLODipine (NORVASC) 10 MG tablet Take 1 tablet by mouth once daily 30 tablet 0   aspirin EC 81 MG tablet Take 1 tablet (81 mg total) by mouth daily. Swallow whole. 90 tablet 1   atorvastatin (LIPITOR) 80 MG tablet Take 1 tablet (80 mg total) by mouth daily. 90 tablet 1   buPROPion (WELLBUTRIN XL) 150 MG 24 hr tablet Take 1 tablet by mouth once daily 90 tablet 0   DULoxetine (CYMBALTA) 60 MG capsule Take 1 capsule by mouth once daily 90 capsule 0   ezetimibe (ZETIA) 10 MG tablet Take 1 tablet by mouth once daily 30 tablet 0   folic acid (FOLVITE) 1 MG tablet Take 1 tablet (1 mg total) by mouth daily. 90 tablet 1   No facility-administered medications prior to visit.    ROS Review of Systems  Constitutional:  Negative for chills, diaphoresis, fatigue and fever.  HENT: Negative.    Eyes: Negative.   Respiratory:  Negative for cough,  chest tightness, shortness of breath and wheezing.   Cardiovascular:  Negative for chest pain, palpitations and leg swelling.  Gastrointestinal:  Negative for abdominal pain, constipation, diarrhea, nausea and vomiting.  Endocrine: Negative.   Genitourinary: Negative.  Negative for difficulty urinating and dysuria.  Musculoskeletal:  Negative for arthralgias, back pain, myalgias and neck pain.  Skin:  Negative for color change, pallor and rash.  Neurological: Negative.  Negative for weakness.  Hematological:  Negative for adenopathy. Does not bruise/bleed easily.  Psychiatric/Behavioral:  Positive for confusion and decreased concentration. Negative for sleep disturbance and suicidal ideas. The patient is not nervous/anxious.     Objective:  BP 134/76 (BP Location: Left Arm, Patient Position: Sitting, Cuff Size: Large)   Pulse 71   Temp 98.1 F (36.7 C) (Oral)   Resp 16   Ht 6' (1.829 m)   Wt 243 lb (110.2 kg)   SpO2 93%   BMI 32.96 kg/m   BP Readings from Last 3 Encounters:  01/08/23 134/76  09/05/22 124/86  03/16/22 (!) 170/92    Wt Readings from Last 3 Encounters:  01/08/23 243 lb (110.2 kg)  10/10/22 249 lb (112.9 kg)  09/05/22 241 lb (109.3 kg)    Physical Exam Vitals reviewed.  Constitutional:      Appearance: He is obese.  HENT:     Mouth/Throat:     Mouth: Mucous membranes are moist.

## 2023-01-08 NOTE — Patient Instructions (Signed)
Hypertension, Adult High blood pressure (hypertension) is when the force of blood pumping through the arteries is too strong. The arteries are the blood vessels that carry blood from the heart throughout the body. Hypertension forces the heart to work harder to pump blood and may cause arteries to become narrow or stiff. Untreated or uncontrolled hypertension can lead to a heart attack, heart failure, a stroke, kidney disease, and other problems. A blood pressure reading consists of a higher number over a lower number. Ideally, your blood pressure should be below 120/80. The first ("top") number is called the systolic pressure. It is a measure of the pressure in your arteries as your heart beats. The second ("bottom") number is called the diastolic pressure. It is a measure of the pressure in your arteries as the heart relaxes. What are the causes? The exact cause of this condition is not known. There are some conditions that result in high blood pressure. What increases the risk? Certain factors may make you more likely to develop high blood pressure. Some of these risk factors are under your control, including: Smoking. Not getting enough exercise or physical activity. Being overweight. Having too much fat, sugar, calories, or salt (sodium) in your diet. Drinking too much alcohol. Other risk factors include: Having a personal history of heart disease, diabetes, high cholesterol, or kidney disease. Stress. Having a family history of high blood pressure and high cholesterol. Having obstructive sleep apnea. Age. The risk increases with age. What are the signs or symptoms? High blood pressure may not cause symptoms. Very high blood pressure (hypertensive crisis) may cause: Headache. Fast or irregular heartbeats (palpitations). Shortness of breath. Nosebleed. Nausea and vomiting. Vision changes. Severe chest pain, dizziness, and seizures. How is this diagnosed? This condition is diagnosed by  measuring your blood pressure while you are seated, with your arm resting on a flat surface, your legs uncrossed, and your feet flat on the floor. The cuff of the blood pressure monitor will be placed directly against the skin of your upper arm at the level of your heart. Blood pressure should be measured at least twice using the same arm. Certain conditions can cause a difference in blood pressure between your right and left arms. If you have a high blood pressure reading during one visit or you have normal blood pressure with other risk factors, you may be asked to: Return on a different day to have your blood pressure checked again. Monitor your blood pressure at home for 1 week or longer. If you are diagnosed with hypertension, you may have other blood or imaging tests to help your health care provider understand your overall risk for other conditions. How is this treated? This condition is treated by making healthy lifestyle changes, such as eating healthy foods, exercising more, and reducing your alcohol intake. You may be referred for counseling on a healthy diet and physical activity. Your health care provider may prescribe medicine if lifestyle changes are not enough to get your blood pressure under control and if: Your systolic blood pressure is above 130. Your diastolic blood pressure is above 80. Your personal target blood pressure may vary depending on your medical conditions, your age, and other factors. Follow these instructions at home: Eating and drinking  Eat a diet that is high in fiber and potassium, and low in sodium, added sugar, and fat. An example of this eating plan is called the DASH diet. DASH stands for Dietary Approaches to Stop Hypertension. To eat this way: Eat   plenty of fresh fruits and vegetables. Try to fill one half of your plate at each meal with fruits and vegetables. Eat whole grains, such as whole-wheat pasta, brown rice, or whole-grain bread. Fill about one  fourth of your plate with whole grains. Eat or drink low-fat dairy products, such as skim milk or low-fat yogurt. Avoid fatty cuts of meat, processed or cured meats, and poultry with skin. Fill about one fourth of your plate with lean proteins, such as fish, chicken without skin, beans, eggs, or tofu. Avoid pre-made and processed foods. These tend to be higher in sodium, added sugar, and fat. Reduce your daily sodium intake. Many people with hypertension should eat less than 1,500 mg of sodium a day. Do not drink alcohol if: Your health care provider tells you not to drink. You are pregnant, may be pregnant, or are planning to become pregnant. If you drink alcohol: Limit how much you have to: 0-1 drink a day for women. 0-2 drinks a day for men. Know how much alcohol is in your drink. In the [place] one drink equals one 12 oz bottle of beer (355 mL), one 5 oz glass of wine (148 mL), or one 1 oz glass of hard liquor (44 mL). Lifestyle  Work with your health care provider to maintain a healthy body weight or to lose weight. Ask what an ideal weight is for you. Get at least 30 minutes of exercise that causes your heart to beat faster (aerobic exercise) most days of the week. Activities may include walking, swimming, or biking. Include exercise to strengthen your muscles (resistance exercise), such as Pilates or lifting weights, as part of your weekly exercise routine. Try to do these types of exercises for 30 minutes at least 3 days a week. Do not use any products that contain nicotine or tobacco. These products include cigarettes, chewing tobacco, and vaping devices, such as e-cigarettes. If you need help quitting, ask your health care provider. Monitor your blood pressure at home as told by your health care provider. Keep all follow-up visits. This is important. Medicines Take over-the-counter and prescription medicines only as told by your health care provider. Follow directions carefully. Blood  pressure medicines must be taken as prescribed. Do not skip doses of blood pressure medicine. Doing this puts you at risk for problems and can make the medicine less effective. Ask your health care provider about side effects or reactions to medicines that you should watch for. Contact a health care provider if you: Think you are having a reaction to a medicine you are taking. Have headaches that keep coming back (recurring). Feel dizzy. Have swelling in your ankles. Have trouble with your vision. Get help right away if you: Develop a severe headache or confusion. Have unusual weakness or numbness. Feel faint. Have severe pain in your chest or abdomen. Vomit repeatedly. Have trouble breathing. These symptoms may be an emergency. Get help right away. Call 911. Do not wait to see if the symptoms will go away. Do not drive yourself to the hospital. Summary Hypertension is when the force of blood pumping through your arteries is too strong. If this condition is not controlled, it may put you at risk for serious complications. Your personal target blood pressure may vary depending on your medical conditions, your age, and other factors. For most people, a normal blood pressure is less than 120/80. Hypertension is treated with lifestyle changes, medicines, or a combination of both. Lifestyle changes include losing weight, eating a healthy,   low-sodium diet, exercising more, and limiting alcohol. This information is not intended to replace advice given to you by your health care provider. Make sure you discuss any questions you have with your health care provider. Document Revised: 01/31/2021 Document Reviewed: 01/31/2021 Elsevier Patient Education  2024 [employer]  

## 2023-01-08 NOTE — Progress Notes (Signed)
Pls let him know that his kidney function has declined. I have ordered a nephrology referral.  His K+ is low. I have prescribed a K+ supplement.  PLT's remain elevated.

## 2023-01-14 ENCOUNTER — Ambulatory Visit (INDEPENDENT_AMBULATORY_CARE_PROVIDER_SITE_OTHER): Payer: Medicare HMO | Admitting: Orthopedic Surgery

## 2023-01-14 ENCOUNTER — Other Ambulatory Visit (INDEPENDENT_AMBULATORY_CARE_PROVIDER_SITE_OTHER): Payer: Self-pay

## 2023-01-14 DIAGNOSIS — M67431 Ganglion, right wrist: Secondary | ICD-10-CM
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
## 2023-01-15 NOTE — Progress Notes (Signed)
This encounter was created in error - please disregard. I called patient's phone twice after waiting for 10 min.  The second time I left a detailed message for him to call office back to reschedule.

## 2023-02-14 ENCOUNTER — Other Ambulatory Visit: Payer: Self-pay

## 2023-02-14 DIAGNOSIS — Z87891 Personal history of nicotine dependence: Secondary | ICD-10-CM

## 2023-02-14 DIAGNOSIS — Z122 Encounter for screening for malignant neoplasm of respiratory organs: Secondary | ICD-10-CM

## 2023-02-14 DIAGNOSIS — F17210 Nicotine dependence, cigarettes, uncomplicated: Secondary | ICD-10-CM

## 2023-03-12 ENCOUNTER — Inpatient Hospital Stay: Admission: RE | Admit: 2023-03-12 | Payer: Medicare HMO | Source: Ambulatory Visit

## 2023-03-27 ENCOUNTER — Other Ambulatory Visit: Payer: Self-pay | Admitting: Internal Medicine

## 2023-03-27 DIAGNOSIS — I10 Essential (primary) hypertension: Secondary | ICD-10-CM

## 2023-04-15 DIAGNOSIS — I129 Hypertensive chronic kidney disease with stage 1 through stage 4 chronic kidney disease, or unspecified chronic kidney disease: Secondary | ICD-10-CM | POA: Diagnosis not present

## 2023-04-15 DIAGNOSIS — N1832 Chronic kidney disease, stage 3b: Secondary | ICD-10-CM | POA: Diagnosis not present

## 2023-04-15 DIAGNOSIS — N2581 Secondary hyperparathyroidism of renal origin: Secondary | ICD-10-CM | POA: Diagnosis not present

## 2023-04-15 DIAGNOSIS — D631 Anemia in chronic kidney disease: Secondary | ICD-10-CM | POA: Diagnosis not present

## 2023-04-16 ENCOUNTER — Other Ambulatory Visit: Payer: Self-pay | Admitting: Nephrology

## 2023-04-16 DIAGNOSIS — N1832 Chronic kidney disease, stage 3b: Secondary | ICD-10-CM

## 2023-04-23 ENCOUNTER — Encounter: Payer: Self-pay | Admitting: Acute Care

## 2023-05-13 ENCOUNTER — Ambulatory Visit (INDEPENDENT_AMBULATORY_CARE_PROVIDER_SITE_OTHER): Payer: Medicare HMO | Admitting: Internal Medicine

## 2023-05-13 ENCOUNTER — Encounter: Payer: Self-pay | Admitting: Internal Medicine

## 2023-05-13 VITALS — BP 140/78 | HR 82 | Temp 97.8°F | Ht 70.0 in | Wt 255.0 lb

## 2023-05-13 DIAGNOSIS — Z720 Tobacco use: Secondary | ICD-10-CM

## 2023-05-13 DIAGNOSIS — H539 Unspecified visual disturbance: Secondary | ICD-10-CM | POA: Diagnosis not present

## 2023-05-13 DIAGNOSIS — R7303 Prediabetes: Secondary | ICD-10-CM | POA: Diagnosis not present

## 2023-05-13 DIAGNOSIS — I2510 Atherosclerotic heart disease of native coronary artery without angina pectoris: Secondary | ICD-10-CM

## 2023-05-13 DIAGNOSIS — D75839 Thrombocytosis, unspecified: Secondary | ICD-10-CM

## 2023-05-13 DIAGNOSIS — I2583 Coronary atherosclerosis due to lipid rich plaque: Secondary | ICD-10-CM | POA: Diagnosis not present

## 2023-05-13 DIAGNOSIS — N1832 Chronic kidney disease, stage 3b: Secondary | ICD-10-CM

## 2023-05-13 DIAGNOSIS — E876 Hypokalemia: Secondary | ICD-10-CM

## 2023-05-13 DIAGNOSIS — Z23 Encounter for immunization: Secondary | ICD-10-CM | POA: Insufficient documentation

## 2023-05-13 DIAGNOSIS — I10 Essential (primary) hypertension: Secondary | ICD-10-CM

## 2023-05-13 DIAGNOSIS — E785 Hyperlipidemia, unspecified: Secondary | ICD-10-CM

## 2023-05-13 DIAGNOSIS — E538 Deficiency of other specified B group vitamins: Secondary | ICD-10-CM | POA: Diagnosis not present

## 2023-05-13 DIAGNOSIS — G4733 Obstructive sleep apnea (adult) (pediatric): Secondary | ICD-10-CM | POA: Diagnosis not present

## 2023-05-13 MED ORDER — AMLODIPINE BESYLATE 10 MG PO TABS: 10.0000 mg | ORAL_TABLET | Freq: Every day | ORAL | 0 refills | Status: DC

## 2023-05-13 MED ORDER — FOLIC ACID 1 MG PO TABS: 1.0000 mg | ORAL_TABLET | Freq: Every day | ORAL | 1 refills | Status: DC

## 2023-05-13 MED ORDER — SHINGRIX 50 MCG/0.5ML IM SUSR: 0.5000 mL | Freq: Once | INTRAMUSCULAR | 1 refills | Status: AC

## 2023-05-13 MED ORDER — BOOSTRIX 5-2.5-18.5 LF-MCG/0.5 IM SUSP: 0.5000 mL | Freq: Once | INTRAMUSCULAR | 0 refills | Status: AC

## 2023-05-13 MED ORDER — ATORVASTATIN CALCIUM 80 MG PO TABS: 80.0000 mg | ORAL_TABLET | Freq: Every day | ORAL | 1 refills | Status: DC

## 2023-05-13 MED ORDER — ASPIRIN 81 MG PO TBEC: 81.0000 mg | DELAYED_RELEASE_TABLET | Freq: Every day | ORAL | 1 refills | Status: DC

## 2023-05-13 NOTE — Progress Notes (Unsigned)
Subjective:  Patient ID: James Sawyer, male    DOB: Jun 29, 1950  Age: 73 y.o. MRN: [id number]  CC: Hypertension and Coronary Artery Disease   HPI JACEYON STROLE presents for f/up ----  Discussed the use of AI scribe software for clinical note transcription with the patient, who gave verbal consent to proceed.  History of Present Illness   The patient presents for a routine follow-up visit.  He feels a little tired but has no chest pain, shortness of breath, dizziness, or lightheadedness. He is not taking a potassium supplement and continues with his usual medications.  He experiences difficulty with vision, particularly when viewing new televisions, and has not had an eye exam in several years.   He has reduced smoking to two to four packs per week from five to six packs, attributing the reduction to life pressures. No symptoms of coughing, wheezing, or shortness of breath.       Outpatient Medications Prior to Visit  Medication Sig Dispense Refill   ezetimibe (ZETIA) 10 MG tablet TAKE 1 TABLET BY MOUTH ONCE DAILY . APPOINTMENT REQUIRED FOR FUTURE REFILLS 15 tablet 0   potassium chloride (KLOR-CON 10) 10 MEQ tablet Take 1 tablet (10 mEq total) by mouth 2 (two) times daily. 180 tablet 0   amLODipine (NORVASC) 10 MG tablet TAKE 1 TABLET BY MOUTH ONCE DAILY . APPOINTMENT REQUIRED FOR FUTURE REFILLS 15 tablet 0   aspirin EC 81 MG tablet Take 1 tablet (81 mg total) by mouth daily. Swallow whole. 90 tablet 1   atorvastatin (LIPITOR) 80 MG tablet Take 1 tablet (80 mg total) by mouth daily. 90 tablet 1   buPROPion (WELLBUTRIN XL) 150 MG 24 hr tablet Take 1 tablet by mouth once daily 90 tablet 0   DULoxetine (CYMBALTA) 60 MG capsule Take 1 capsule by mouth once daily 90 capsule 0   folic acid (FOLVITE) 1 MG tablet Take 1 tablet (1 mg total) by mouth daily. 90 tablet 1   No facility-administered medications prior to visit.    ROS Review of Systems  Constitutional:  Positive for fatigue.  Negative for appetite change, diaphoresis and unexpected weight change.  HENT: Negative.  Negative for sore throat and trouble swallowing.   Eyes:  Negative for discharge.  Respiratory:  Positive for apnea. Negative for cough, chest tightness, shortness of breath and wheezing.   Cardiovascular:  Negative for chest pain, palpitations and leg swelling.  Gastrointestinal:  Negative for abdominal pain, constipation, diarrhea, nausea and vomiting.  Genitourinary:  Negative for difficulty urinating and dysuria.  Musculoskeletal: Negative.  Negative for arthralgias, back pain, myalgias and neck pain.  Skin: Negative.  Negative for color change, pallor and rash.  Neurological:  Negative for dizziness and weakness.  Hematological:  Negative for adenopathy. Does not bruise/bleed easily.  Psychiatric/Behavioral:  Positive for confusion and decreased concentration. Negative for behavioral problems, dysphoric mood and suicidal ideas.     Objective:  BP (!) 140/78 (BP Location: Left Arm, Patient Position: Sitting, Cuff Size: Normal)   Pulse 82   Temp 97.8 F (36.6 C) (Oral)   Ht 5\' 10"  (1.778 m)   Wt 255 lb (115.7 kg)   SpO2 94%   BMI 36.59 kg/m   BP Readings from Last 3 Encounters:  05/13/23 (!) 140/78  01/08/23 134/76  09/05/22 124/86    Wt Readings from Last 3 Encounters:  05/13/23 255 lb (115.7 kg)  01/08/23 243 lb (110.2 kg)  10/10/22 249 lb (112.9 kg)  Physical Exam Vitals reviewed.  Constitutional:      Appearance: Normal appearance.  HENT:     Mouth/Throat:     Mouth: Mucous membranes are moist.  Eyes:     General: No scleral icterus.    Conjunctiva/sclera: Conjunctivae normal.  Cardiovascular:     Rate and Rhythm: Normal rate and regular rhythm.     Heart sounds: No murmur heard.    No friction rub. No gallop.     Comments: EKG-  SR with 1st degree AV block RBBB, LAFB Unchanged No LVH Pulmonary:     Effort: Pulmonary effort is normal.     Breath sounds: No  stridor. No wheezing, rhonchi or rales.  Abdominal:     General: Abdomen is flat.     Palpations: There is no mass.     Tenderness: There is no abdominal tenderness. There is no guarding.     Hernia: No hernia is present.  Musculoskeletal:        General: Normal range of motion.     Cervical back: Neck supple.     Right lower leg: No edema.     Left lower leg: No edema.  Lymphadenopathy:     Cervical: No cervical adenopathy.  Skin:    General: Skin is warm and dry.     Findings: No rash.  Neurological:     General: No focal deficit present.     Mental Status: He is alert. Mental status is at baseline.  Psychiatric:        Mood and Affect: Mood normal.        Behavior: Behavior normal.     Lab Results  Component Value Date   WBC 6.1 05/13/2023   HGB 13.7 05/13/2023   HCT 41.4 05/13/2023   PLT 479.0 (H) 05/13/2023   GLUCOSE 82 05/13/2023   CHOL 184 09/05/2022   TRIG 107.0 09/05/2022   HDL 46.00 09/05/2022   LDLDIRECT 145.0 08/26/2014   LDLCALC 117 (H) 09/05/2022   ALT 17 05/13/2023   AST 18 05/13/2023   NA 146 (H) 05/13/2023   K 4.0 05/13/2023   CL 110 05/13/2023   CREATININE 1.56 (H) 05/13/2023   BUN 16 05/13/2023   CO2 27 05/13/2023   TSH 2.11 05/13/2023   PSA 0.62 09/05/2022   INR 1.0 08/19/2018   HGBA1C 6.2 05/13/2023    ECHOCARDIOGRAM COMPLETE Result Date: 02/02/2021    ECHOCARDIOGRAM REPORT   Patient Name:   James Sawyer Date of Exam: 02/02/2021 Medical Rec #:  [id number]     Height:       70.0 in Accession #:    [id number]    Weight:       260.0 lb Date of Birth:  February 07, 1951      BSA:          2.334 m Patient Age:    70 years      BP:           196/99 mmHg Patient Gender: M             HR:           59 bpm. Exam Location:  Inpatient Procedure: 2D Echo, Color Doppler, Cardiac Doppler and Intracardiac            Opacification Agent Indications:     I25.110 Atherosclerotic heart disease of native coronary artery                  with unstable angina pectoris  History:         Patient has prior history of Echocardiogram examinations, most                  recent 10/22/2014. CAD.  Sonographer:     Sheralyn Boatman RDCS Referring Phys:  [id number] MICHAEL E NORINS Diagnosing Phys: Orpah Cobb MD IMPRESSIONS  1. Left ventricular ejection fraction, by estimation, is 55 to 60%. The left ventricle has normal function. The left ventricle has no regional wall motion abnormalities. There is moderate concentric left ventricular hypertrophy. Left ventricular diastolic parameters are consistent with Grade I diastolic dysfunction (impaired relaxation).  2. Right ventricular systolic function is normal. The right ventricular size is normal.  3. The mitral valve is normal in structure. Mild mitral valve regurgitation.  4. The aortic valve is tricuspid. There is moderate calcification of the aortic valve. There is mild thickening of the aortic valve. Aortic valve regurgitation is not visualized. Mild aortic valve sclerosis is present, with no evidence of aortic valve stenosis.  5. There is mild (Grade II) atheroma plaque involving the aortic root and ascending aorta.  6. The inferior vena cava is normal in size with <50% respiratory variability, suggesting right atrial pressure of 8 mmHg. FINDINGS  Left Ventricle: Left ventricular ejection fraction, by estimation, is 55 to 60%. The left ventricle has normal function. The left ventricle has no regional wall motion abnormalities. Definity contrast agent was given IV to delineate the left ventricular  endocardial borders. The left ventricular internal cavity size was normal in size. There is moderate concentric left ventricular hypertrophy. Left ventricular diastolic parameters are consistent with Grade I diastolic dysfunction (impaired relaxation). Right Ventricle: The right ventricular size is normal. No increase in right ventricular wall thickness. Right ventricular systolic function is normal. Left Atrium: Left atrial size was normal in size. Right  Atrium: Right atrial size was not assessed. Pericardium: There is no evidence of pericardial effusion. Mitral Valve: The mitral valve is normal in structure. There is mild thickening of the mitral valve leaflet(s). There is mild calcification of the mitral valve leaflet(s). Mild mitral annular calcification. Mild mitral valve regurgitation. Tricuspid Valve: The tricuspid valve is normal in structure. Tricuspid valve regurgitation is trivial. Aortic Valve: The aortic valve is tricuspid. There is moderate calcification of the aortic valve. There is mild thickening of the aortic valve. There is mild aortic valve annular calcification. Aortic valve regurgitation is not visualized. Mild aortic valve sclerosis is present, with no evidence of aortic valve stenosis. Pulmonic Valve: The pulmonic valve was grossly normal. Pulmonic valve regurgitation is not visualized. Aorta: The aortic root is normal in size and structure. There is mild (Grade II) atheroma plaque involving the aortic root and ascending aorta. Venous: The inferior vena cava is normal in size with less than 50% respiratory variability, suggesting right atrial pressure of 8 mmHg. IAS/Shunts: The atrial septum is grossly normal.  LEFT VENTRICLE PLAX 2D LVIDd:         5.10 cm      Diastology LVIDs:         3.60 cm      LV e' medial:    3.83 cm/s LV PW:         1.60 cm      LV E/e' medial:  13.8 LV IVS:        1.60 cm      LV e' lateral:   5.68 cm/s LVOT diam:     2.20 cm  LV E/e' lateral: 9.3 LV SV:         73 LV SV Index:   31 LVOT Area:     3.80 cm                              3D Volume EF: LV Volumes (MOD)            3D EF:        40 % LV vol d, MOD A2C: 110.0 ml LV EDV:       186 ml LV vol d, MOD A4C: 76.4 ml  LV ESV:       112 ml LV vol s, MOD A2C: 59.8 ml  LV SV:        74 ml LV vol s, MOD A4C: 44.0 ml LV SV MOD A2C:     50.2 ml LV SV MOD A4C:     76.4 ml LV SV MOD BP:      42.8 ml RIGHT VENTRICLE RV S prime:     8.61 cm/s TAPSE (M-mode): 1.4 cm LEFT  ATRIUM             Index        RIGHT ATRIUM           Index LA diam:        4.30 cm 1.84 cm/m   RA Area:     11.50 cm LA Vol (A2C):   61.3 ml 26.27 ml/m  RA Volume:   22.50 ml  9.64 ml/m LA Vol (A4C):   29.0 ml 12.43 ml/m LA Biplane Vol: 42.8 ml 18.34 ml/m  AORTIC VALVE LVOT Vmax:   113.00 cm/s LVOT Vmean:  65.100 cm/s LVOT VTI:    0.193 m  AORTA Ao Root diam: 3.70 cm Ao Asc diam:  3.80 cm MITRAL VALVE MV Area (PHT): 3.68 cm    SHUNTS MV Decel Time: 206 msec    Systemic VTI:  0.19 m MV E velocity: 52.70 cm/s  Systemic Diam: 2.20 cm MV A velocity: 67.10 cm/s MV E/A ratio:  0.79 Orpah Cobb MD Electronically signed by Orpah Cobb MD Signature Date/Time: 02/02/2021/12:48:56 PM    Final    DG Chest 2 View Result Date: 02/01/2021 CLINICAL DATA:  Shortness of breath EXAM: CHEST - 2 VIEW COMPARISON:  08/19/2018 FINDINGS: The heart size and mediastinal contours are within normal limits. Both lungs are clear. The visualized skeletal structures are unremarkable. Mild chronic elevation right diaphragm. IMPRESSION: No active cardiopulmonary disease. Electronically Signed   By: Jasmine Pang M.D.   On: 02/01/2021 19:48    Assessment & Plan:   CKD stage 3b, GFR 30-44 ml/min (HCC)- Renal function has improved. -     Basic metabolic panel; Future -     Urinalysis, Routine w reflex microscopic; Future  Hypokalemia -     Basic metabolic panel; Future -     Magnesium; Future  Thrombocytosis -     CBC with Differential/Platelet; Future -     IBC + Ferritin; Future -     Hepatic function panel; Future  Prediabetes -     Basic metabolic panel; Future -     Hemoglobin A1c; Future  Coronary artery disease due to lipid rich plaque -     Atorvastatin Calcium; Take 1 tablet (80 mg total) by mouth daily.  Dispense: 90 tablet; Refill: 1 -     Ambulatory referral to Cardiology -  AMB Referral [facility] Care Management  Essential hypertension- He has not achieved his BP goal. Will restart the CCB -      Basic metabolic panel; Future -     Urinalysis, Routine w reflex microscopic; Future -     EKG 12-Lead -     amLODIPine Besylate; Take 1 tablet (10 mg total) by mouth daily.  Dispense: 90 tablet; Refill: 0 -     TSH; Future -     AMB Referral [facility]BCI Care Management  Need for prophylactic vaccination and inoculation against varicella -     Shingrix; Inject 0.5 mLs into the muscle once for 1 dose.  Dispense: 0.5 mL; Refill: 1  Need for prophylactic vaccination with combined diphtheria-tetanus-pertussis (DTP) vaccine -     Boostrix; Inject 0.5 mLs into the muscle once for 1 dose.  Dispense: 0.5 mL; Refill: 0  Hyperlipidemia with target LDL less than 100 -     Hepatic function panel; Future -     Atorvastatin Calcium; Take 1 tablet (80 mg total) by mouth daily.  Dispense: 90 tablet; Refill: 1 -     TSH; Future -     AMB Referral [facility]BCI Care Management  Tobacco abuse -     Ambulatory Referral for Lung Cancer Scre  Folate deficiency -     Folic Acid; Take 1 tablet (1 mg total) by mouth daily.  Dispense: 90 tablet; Refill: 1 -     Vitamin B12; Future  Coronary artery disease involving native coronary artery of native heart without angina pectoris -     Aspirin; Take 1 tablet (81 mg total) by mouth daily. Swallow whole.  Dispense: 90 tablet; Refill: 1  Vision changes -     Ambulatory referral to Ophthalmology  OSA (obstructive sleep apnea) -     Ambulatory referral to Sleep Studies     Follow-up: Return in about 6 months (around 11/10/2023).  Sanda Linger, MD

## 2023-05-13 NOTE — Patient Instructions (Signed)
 Hypertension, Adult High blood pressure (hypertension) is when the force of blood pumping through the arteries is too strong. The arteries are the blood vessels that carry blood from the heart throughout the body. Hypertension forces the heart to work harder to pump blood and may cause arteries to become narrow or stiff. Untreated or uncontrolled hypertension can lead to a heart attack, heart failure, a stroke, kidney disease, and other problems. A blood pressure reading consists of a higher number over a lower number. Ideally, your blood pressure should be below 120/80. The first ("top") number is called the systolic pressure. It is a measure of the pressure in your arteries as your heart beats. The second ("bottom") number is called the diastolic pressure. It is a measure of the pressure in your arteries as the heart relaxes. What are the causes? The exact cause of this condition is not known. There are some conditions that result in high blood pressure. What increases the risk? Certain factors may make you more likely to develop high blood pressure. Some of these risk factors are under your control, including: Smoking. Not getting enough exercise or physical activity. Being overweight. Having too much fat, sugar, calories, or salt (sodium) in your diet. Drinking too much alcohol. Other risk factors include: Having a personal history of heart disease, diabetes, high cholesterol, or kidney disease. Stress. Having a family history of high blood pressure and high cholesterol. Having obstructive sleep apnea. Age. The risk increases with age. What are the signs or symptoms? High blood pressure may not cause symptoms. Very high blood pressure (hypertensive crisis) may cause: Headache. Fast or irregular heartbeats (palpitations). Shortness of breath. Nosebleed. Nausea and vomiting. Vision changes. Severe chest pain, dizziness, and seizures. How is this diagnosed? This condition is diagnosed by  measuring your blood pressure while you are seated, with your arm resting on a flat surface, your legs uncrossed, and your feet flat on the floor. The cuff of the blood pressure monitor will be placed directly against the skin of your upper arm at the level of your heart. Blood pressure should be measured at least twice using the same arm. Certain conditions can cause a difference in blood pressure between your right and left arms. If you have a high blood pressure reading during one visit or you have normal blood pressure with other risk factors, you may be asked to: Return on a different day to have your blood pressure checked again. Monitor your blood pressure at home for 1 week or longer. If you are diagnosed with hypertension, you may have other blood or imaging tests to help your health care provider understand your overall risk for other conditions. How is this treated? This condition is treated by making healthy lifestyle changes, such as eating healthy foods, exercising more, and reducing your alcohol intake. You may be referred for counseling on a healthy diet and physical activity. Your health care provider may prescribe medicine if lifestyle changes are not enough to get your blood pressure under control and if: Your systolic blood pressure is above 130. Your diastolic blood pressure is above 80. Your personal target blood pressure may vary depending on your medical conditions, your age, and other factors. Follow these instructions at home: Eating and drinking  Eat a diet that is high in fiber and potassium, and low in sodium, added sugar, and fat. An example of this eating plan is called the DASH diet. DASH stands for Dietary Approaches to Stop Hypertension. To eat this way: Eat  plenty of fresh fruits and vegetables. Try to fill one half of your plate at each meal with fruits and vegetables. Eat whole grains, such as whole-wheat pasta, brown rice, or whole-grain bread. Fill about one  fourth of your plate with whole grains. Eat or drink low-fat dairy products, such as skim milk or low-fat yogurt. Avoid fatty cuts of meat, processed or cured meats, and poultry with skin. Fill about one fourth of your plate with lean proteins, such as fish, chicken without skin, beans, eggs, or tofu. Avoid pre-made and processed foods. These tend to be higher in sodium, added sugar, and fat. Reduce your daily sodium intake. Many people with hypertension should eat less than 1,500 mg of sodium a day. Do not drink alcohol if: Your health care provider tells you not to drink. You are pregnant, may be pregnant, or are planning to become pregnant. If you drink alcohol: Limit how much you have to: 0-1 drink a day for women. 0-2 drinks a day for men. Know how much alcohol is in your drink. In the [place] one drink equals one 12 oz bottle of beer (355 mL), one 5 oz glass of wine (148 mL), or one 1 oz glass of hard liquor (44 mL). Lifestyle  Work with your health care provider to maintain a healthy body weight or to lose weight. Ask what an ideal weight is for you. Get at least 30 minutes of exercise that causes your heart to beat faster (aerobic exercise) most days of the week. Activities may include walking, swimming, or biking. Include exercise to strengthen your muscles (resistance exercise), such as Pilates or lifting weights, as part of your weekly exercise routine. Try to do these types of exercises for 30 minutes at least 3 days a week. Do not use any products that contain nicotine or tobacco. These products include cigarettes, chewing tobacco, and vaping devices, such as e-cigarettes. If you need help quitting, ask your health care provider. Monitor your blood pressure at home as told by your health care provider. Keep all follow-up visits. This is important. Medicines Take over-the-counter and prescription medicines only as told by your health care provider. Follow directions carefully. Blood  pressure medicines must be taken as prescribed. Do not skip doses of blood pressure medicine. Doing this puts you at risk for problems and can make the medicine less effective. Ask your health care provider about side effects or reactions to medicines that you should watch for. Contact a health care provider if you: Think you are having a reaction to a medicine you are taking. Have headaches that keep coming back (recurring). Feel dizzy. Have swelling in your ankles. Have trouble with your vision. Get help right away if you: Develop a severe headache or confusion. Have unusual weakness or numbness. Feel faint. Have severe pain in your chest or abdomen. Vomit repeatedly. Have trouble breathing. These symptoms may be an emergency. Get help right away. Call 911. Do not wait to see if the symptoms will go away. Do not drive yourself to the hospital. Summary Hypertension is when the force of blood pumping through your arteries is too strong. If this condition is not controlled, it may put you at risk for serious complications. Your personal target blood pressure may vary depending on your medical conditions, your age, and other factors. For most people, a normal blood pressure is less than 120/80. Hypertension is treated with lifestyle changes, medicines, or a combination of both. Lifestyle changes include losing weight, eating a healthy,  low-sodium diet, exercising more, and limiting alcohol. This information is not intended to replace advice given to you by your health care provider. Make sure you discuss any questions you have with your health care provider. Document Revised: 01/31/2021 Document Reviewed: 01/31/2021 Elsevier Patient Education  2024 [employer].

## 2023-05-14 DIAGNOSIS — H539 Unspecified visual disturbance: Secondary | ICD-10-CM | POA: Insufficient documentation

## 2023-05-14 LAB — VITAMIN B12: Vitamin B-12: 224 pg/mL (ref 211–911)

## 2023-05-14 LAB — CBC WITH DIFFERENTIAL/PLATELET
Basophils Absolute: 0.1 10*3/uL (ref 0.0–0.1)
Basophils Relative: 1.1 % (ref 0.0–3.0)
Eosinophils Absolute: 0.2 10*3/uL (ref 0.0–0.7)
Eosinophils Relative: 3 % (ref 0.0–5.0)
HCT: 41.4 % (ref 39.0–52.0)
Hemoglobin: 13.7 g/dL (ref 13.0–17.0)
Lymphocytes Relative: 34.1 % (ref 12.0–46.0)
Lymphs Abs: 2.1 10*3/uL (ref 0.7–4.0)
MCHC: 33.1 g/dL (ref 30.0–36.0)
MCV: 96.3 fL (ref 78.0–100.0)
Monocytes Absolute: 0.6 10*3/uL (ref 0.1–1.0)
Monocytes Relative: 10.7 % (ref 3.0–12.0)
Neutro Abs: 3.1 10*3/uL (ref 1.4–7.7)
Neutrophils Relative %: 51.1 % (ref 43.0–77.0)
Platelets: 479 10*3/uL — ABNORMAL HIGH (ref 150.0–400.0)
RBC: 4.3 Mil/uL (ref 4.22–5.81)
RDW: 15.3 % (ref 11.5–15.5)
WBC: 6.1 10*3/uL (ref 4.0–10.5)

## 2023-05-14 LAB — URINALYSIS, ROUTINE W REFLEX MICROSCOPIC
Bilirubin Urine: NEGATIVE
Hgb urine dipstick: NEGATIVE
Leukocytes,Ua: NEGATIVE
Nitrite: NEGATIVE
RBC / HPF: NONE SEEN (ref 0–?)
Specific Gravity, Urine: 1.02 (ref 1.000–1.030)
Total Protein, Urine: NEGATIVE
Urine Glucose: NEGATIVE
Urobilinogen, UA: 0.2 (ref 0.0–1.0)
pH: 6.5 (ref 5.0–8.0)

## 2023-05-14 LAB — IBC + FERRITIN
Ferritin: 8 ng/mL — ABNORMAL LOW (ref 22.0–322.0)
Iron: 72 ug/dL (ref 42–165)
Saturation Ratios: 16.6 % — ABNORMAL LOW (ref 20.0–50.0)
TIBC: 434 ug/dL (ref 250.0–450.0)
Transferrin: 310 mg/dL (ref 212.0–360.0)

## 2023-05-14 LAB — BASIC METABOLIC PANEL
BUN: 16 mg/dL (ref 6–23)
CO2: 27 meq/L (ref 19–32)
Calcium: 9.3 mg/dL (ref 8.4–10.5)
Chloride: 110 meq/L (ref 96–112)
Creatinine, Ser: 1.56 mg/dL — ABNORMAL HIGH (ref 0.40–1.50)
GFR: 44.1 mL/min — ABNORMAL LOW (ref >60.00)
Glucose, Bld: 82 mg/dL (ref 70–99)
Potassium: 4 meq/L (ref 3.5–5.1)
Sodium: 146 meq/L — ABNORMAL HIGH (ref 135–145)

## 2023-05-14 LAB — HEPATIC FUNCTION PANEL
ALT: 17 U/L (ref 0–53)
AST: 18 U/L (ref 0–37)
Albumin: 4.1 g/dL (ref 3.5–5.2)
Alkaline Phosphatase: 74 U/L (ref 39–117)
Bilirubin, Direct: 0.1 mg/dL (ref 0.0–0.3)
Total Bilirubin: 0.3 mg/dL (ref 0.2–1.2)
Total Protein: 7.1 g/dL (ref 6.0–8.3)

## 2023-05-14 LAB — MAGNESIUM: Magnesium: 2 mg/dL (ref 1.5–2.5)

## 2023-05-14 LAB — HEMOGLOBIN A1C: Hgb A1c MFr Bld: 6.2 % (ref 4.6–6.5)

## 2023-05-14 LAB — TSH: TSH: 2.11 u[IU]/mL (ref 0.35–5.50)

## 2023-05-15 ENCOUNTER — Telehealth: Payer: Self-pay | Admitting: Internal Medicine

## 2023-05-15 ENCOUNTER — Telehealth: Payer: Self-pay

## 2023-05-15 NOTE — Telephone Encounter (Signed)
 LVM for patient, to clarify medication he is referring to

## 2023-05-15 NOTE — Progress Notes (Signed)
 Care Guide Pharmacy Note  05/15/2023 Name: James Sawyer MRN: [id number] DOB: 1951-01-16  Referred By: Joshua Debby CROME, MD Reason for referral: Care Coordination (Outreach to schedule with Pharm d )   James Sawyer is a 72 y.o. year old male who is a primary care patient of Joshua Debby CROME, MD.  James Sawyer Call was referred to the pharmacist for assistance related to: HTN and HLD  Successful contact was made with the patient to discuss pharmacy services including being ready for the pharmacist to call at least 5 minutes before the scheduled appointment time and to have medication bottles and any blood pressure readings ready for review. The patient agreed to meet with the pharmacist via telephone visit on (date/time).  Jeoffrey Buffalo , RMA     [facility]  [facility], [facility] Guide  Direct Dial: [phone]  Website: [email or web address]

## 2023-05-15 NOTE — Telephone Encounter (Signed)
 Copied from CRM [id number]. Topic: Clinical - Medication Question >> May 15, 2023 10:44 AM Viola F wrote: Reason for CRM: Patient request call back regarding increase on depression medication, he doesn't know the name of the medication but he is calling to see if the dosage was increased? And will the price of the medication go up due to increase of dosage?

## 2023-05-15 NOTE — Progress Notes (Signed)
 Care Guide Pharmacy Note  05/15/2023 Name: James Sawyer MRN: [id number] DOB: 05-20-1950  Referred By: Joshua Debby CROME, MD Reason for referral: Care Coordination (Outreach to schedule with Pharm d )   James Sawyer is a 73 y.o. year old male who is a primary care patient of Joshua Debby CROME, MD.  Amelie JINNY Call was referred to the pharmacist for assistance related to: HTN and HLD  An unsuccessful telephone outreach was attempted today to contact the patient who was referred to the pharmacy team for assistance with medication management. Additional attempts will be made to contact the patient.  Jeoffrey Buffalo , RMA     [facility]  [facility], [facility] Guide  Direct Dial: [phone]  Website: [email or web address]

## 2023-05-16 NOTE — Telephone Encounter (Signed)
 Spoke with patient, he is unable to clarify which medication he is referring to. After looking at most recent, OV looks like the medication is Wellbutrin . Please advise dose and send to pharmacy on file.

## 2023-05-16 NOTE — Telephone Encounter (Signed)
 LVM for patient, 2nd attempt.

## 2023-05-17 ENCOUNTER — Encounter: Payer: Self-pay | Admitting: Internal Medicine

## 2023-05-19 ENCOUNTER — Other Ambulatory Visit: Payer: Self-pay | Admitting: Internal Medicine

## 2023-05-19 DIAGNOSIS — F331 Major depressive disorder, recurrent, moderate: Secondary | ICD-10-CM

## 2023-05-19 MED ORDER — DULOXETINE HCL 30 MG PO CPEP: 30.0000 mg | ORAL_CAPSULE | Freq: Every day | ORAL | 0 refills | Status: DC

## 2023-05-19 MED ORDER — BUPROPION HCL ER (XL) 150 MG PO TB24: 150.0000 mg | ORAL_TABLET | Freq: Every day | ORAL | 0 refills | Status: DC

## 2023-05-22 DIAGNOSIS — H35033 Hypertensive retinopathy, bilateral: Secondary | ICD-10-CM | POA: Diagnosis not present

## 2023-05-22 DIAGNOSIS — H2513 Age-related nuclear cataract, bilateral: Secondary | ICD-10-CM | POA: Diagnosis not present

## 2023-05-22 DIAGNOSIS — E119 Type 2 diabetes mellitus without complications: Secondary | ICD-10-CM | POA: Diagnosis not present

## 2023-05-22 DIAGNOSIS — H40013 Open angle with borderline findings, low risk, bilateral: Secondary | ICD-10-CM | POA: Diagnosis not present

## 2023-05-31 ENCOUNTER — Encounter: Payer: Self-pay | Admitting: Internal Medicine

## 2023-05-31 ENCOUNTER — Ambulatory Visit: Payer: Medicare HMO

## 2023-06-03 ENCOUNTER — Other Ambulatory Visit (INDEPENDENT_AMBULATORY_CARE_PROVIDER_SITE_OTHER): Payer: Medicare HMO | Admitting: Pharmacist

## 2023-06-03 DIAGNOSIS — E785 Hyperlipidemia, unspecified: Secondary | ICD-10-CM

## 2023-06-03 DIAGNOSIS — I10 Essential (primary) hypertension: Secondary | ICD-10-CM

## 2023-06-03 NOTE — Patient Instructions (Signed)
 It was a pleasure speaking with you today!  Take medications daily. Check blood pressure when you go to the pharmacy. Goal blood pressure is under 130/80.  Feel free to call with any questions or concerns!  Arbutus Leas, PharmD, BCPS, CPP Clinical Pharmacist Practitioner [facility] [phone]

## 2023-06-03 NOTE — Progress Notes (Signed)
 06/03/2023 Name: James Sawyer MRN: [id number] DOB: February 01, 1951  Chief Complaint  Patient presents with   Hypertension   Hyperlipidemia   Medication Management    James Sawyer is a 73 y.o. year old male who presented for a telephone visit.   They were referred to the pharmacist by their PCP for assistance in managing hypertension and hyperlipidemia.    Subjective:  Care Team: Primary Care Provider: Etta Grandchild, MD ; Next Scheduled Visit: 11/11/23  Medication Access/Adherence  Current Pharmacy:  [facility] [address] ([address] [place] - [address] [place] ([place]) [place] [address] Phone: [phone] Fax: [phone]   Patient reports affordability concerns with their medications: Yes  Patient reports access/transportation concerns to their pharmacy: No  Patient reports adherence concerns with their medications:  Yes    Pt reports missing meds about 2x per week Suspect medication nonadherence per fill history - meds not filled since August 2024 for 90 DS Pt reports medication cost is an issue - per medicare.gov all meds are $21.72 per month   Hypertension:  Current medications: amlodipine 10 mg daily ($0 copay) Medications previously tried: Oceanographer, hydralazine, hydrochlorothiazide, indapamide, irbesartan, lisinopril/hydrochlorothiazide, telmisartan, valsartan/HCTZ  Patient does not have a validated, automated, upper arm home BP cuff Current blood pressure readings: none    Hyperlipidemia/ASCVD Risk Reduction  Current lipid lowering medications: atorvastatin 80 mg daily ($0 copay), ezetimibe 10 mg daily ($5 copay) Medications tried in the past: rosuvastatin  Antiplatelet regimen: aspirin 81 mg daily   Objective:  Lab Results  Component Value Date   HGBA1C 6.2 05/13/2023    Lab Results  Component Value Date   CREATININE 1.56 (H) 05/13/2023   BUN 16 05/13/2023   NA 146 (H) 05/13/2023   K 4.0 05/13/2023   CL 110  05/13/2023   CO2 27 05/13/2023    Lab Results  Component Value Date   CHOL 184 09/05/2022   HDL 46.00 09/05/2022   LDLCALC 117 (H) 09/05/2022   LDLDIRECT 145.0 08/26/2014   TRIG 107.0 09/05/2022   CHOLHDL 4 09/05/2022    Medications Reviewed Today     Reviewed by Bonita Quin, RPH (Pharmacist) on 06/03/23 at 1537  Med List Status: <None>   Medication Order Taking? Sig Documenting Provider Last Dose Status Informant  amLODipine (NORVASC) 10 MG tablet [id number] Yes Take 1 tablet (10 mg total) by mouth daily. Etta Grandchild, MD Taking Active   aspirin EC 81 MG tablet [id number] Yes Take 1 tablet (81 mg total) by mouth daily. Swallow whole. Etta Grandchild, MD Taking Active   atorvastatin (LIPITOR) 80 MG tablet [id number] Yes Take 1 tablet (80 mg total) by mouth daily. Etta Grandchild, MD Taking Active   buPROPion (WELLBUTRIN XL) 150 MG 24 hr tablet [id number]  Take 1 tablet (150 mg total) by mouth daily. Etta Grandchild, MD  Active   DULoxetine (CYMBALTA) 30 MG capsule [id number] Yes Take 1 capsule (30 mg total) by mouth daily. Etta Grandchild, MD Taking Active   ezetimibe (ZETIA) 10 MG tablet [id number] Yes TAKE 1 TABLET BY MOUTH ONCE DAILY . APPOINTMENT REQUIRED FOR FUTURE REFILLS Pricilla Riffle, MD Taking Active   folic acid (FOLVITE) 1 MG tablet [id number] Yes Take 1 tablet (1 mg total) by mouth daily. Etta Grandchild, MD Taking Active   potassium chloride (KLOR-CON 10) 10 MEQ tablet [id number] Yes Take 1 tablet (10 mEq total) by mouth 2 (two) times daily.  Patient  taking differently: Take 10 mEq by mouth daily.   Etta Grandchild, MD Taking Active     Discontinued 03/29/11 1006 (Cost of medication)               Assessment/Plan:   Hypertension: - Currently uncontrolled likely secondary to nonadherence, BP goal <130/80 - Recommended to check blood pressure and heart rate when possible at the pharmacy - Recommend to take amlodipine daily, avoid missed doses       Hyperlipidemia/ASCVD Risk Reduction: - Currently uncontrolled likely secondary to nonadherence. LDL goal <70 - Recommend to take atorvastatin daily   Medication adherence: Encouraged pt to fill all of his medications however since cost was an issue did say the most important for him to take daily are amlodipine, aspirin, and atorvastatin.    Follow Up Plan: 1 month  Arbutus Leas, PharmD, BCPS, CPP Clinical Pharmacist Practitioner [facility] [phone]

## 2023-06-26 ENCOUNTER — Other Ambulatory Visit: Payer: Self-pay

## 2023-06-26 ENCOUNTER — Other Ambulatory Visit: Payer: Self-pay | Admitting: Internal Medicine

## 2023-06-26 DIAGNOSIS — E538 Deficiency of other specified B group vitamins: Secondary | ICD-10-CM

## 2023-06-26 DIAGNOSIS — E876 Hypokalemia: Secondary | ICD-10-CM

## 2023-06-26 DIAGNOSIS — I10 Essential (primary) hypertension: Secondary | ICD-10-CM

## 2023-06-26 DIAGNOSIS — I2510 Atherosclerotic heart disease of native coronary artery without angina pectoris: Secondary | ICD-10-CM

## 2023-06-26 DIAGNOSIS — E785 Hyperlipidemia, unspecified: Secondary | ICD-10-CM

## 2023-06-26 DIAGNOSIS — F331 Major depressive disorder, recurrent, moderate: Secondary | ICD-10-CM

## 2023-06-26 MED ORDER — EZETIMIBE 10 MG PO TABS: 10.0000 mg | ORAL_TABLET | Freq: Every day | ORAL | 0 refills | Status: DC

## 2023-06-26 MED ORDER — ATORVASTATIN CALCIUM 80 MG PO TABS: 80.0000 mg | ORAL_TABLET | Freq: Every day | ORAL | 1 refills | Status: DC

## 2023-06-26 MED ORDER — AMLODIPINE BESYLATE 10 MG PO TABS: 10.0000 mg | ORAL_TABLET | Freq: Every day | ORAL | 1 refills | Status: DC

## 2023-06-26 MED ORDER — ASPIRIN 81 MG PO TBEC: 81.0000 mg | DELAYED_RELEASE_TABLET | Freq: Every day | ORAL | 1 refills | Status: DC

## 2023-06-26 MED ORDER — FOLIC ACID 1 MG PO TABS: 1.0000 mg | ORAL_TABLET | Freq: Every day | ORAL | 1 refills | Status: DC

## 2023-06-26 MED ORDER — BUPROPION HCL ER (XL) 150 MG PO TB24: 150.0000 mg | ORAL_TABLET | Freq: Every day | ORAL | 1 refills | Status: DC

## 2023-06-26 MED ORDER — POTASSIUM CHLORIDE ER 10 MEQ PO TBCR: 10.0000 meq | EXTENDED_RELEASE_TABLET | Freq: Two times a day (BID) | ORAL | 1 refills | Status: DC

## 2023-06-26 MED ORDER — DULOXETINE HCL 30 MG PO CPEP: 30.0000 mg | ORAL_CAPSULE | Freq: Every day | ORAL | 1 refills | Status: DC

## 2023-06-26 NOTE — Telephone Encounter (Signed)
 Copied from CRM [id number]. Topic: Clinical - Medication Refill >> Jun 26, 2023  1:40 PM Denese Killings wrote: Most Recent Primary Care Visit:  Provider: Candy Sledge R  Department: [facility]  Visit Type: PATIENT OUTREACH 60  Date: 06/03/2023  Medication: amLODipine (NORVASC) 10 MG tablet  aspirin EC 81 MG tablet, atorvastatin (LIPITOR) 80 MG tablet, buPROPion (WELLBUTRIN XL) 150 MG 24 hr tablet, DULoxetine (CYMBALTA) 30 MG capsule ,  folic acid (FOLVITE) 1 MG tablet, potassium chloride (KLOR-CON 10) 10 MEQ tablet  Has the patient contacted their pharmacy? Yes (Agent: If no, request that the patient contact the pharmacy for the refill. If patient does not wish to contact the pharmacy document the reason why and proceed with request.) (Agent: If yes, when and what did the pharmacy advise?) advise to call doctor  Is this the correct pharmacy for this prescription? Yes If no, delete pharmacy and type the correct one.  This is the patient's preferred pharmacy:  [facility] [address] ([address] [place] - [address] [place] ([place]) [place] [address] Phone: [phone] Fax: [phone]   Has the prescription been filled recently? No  Is the patient out of the medication? Yes  Has the patient been seen for an appointment in the last year OR does the patient have an upcoming appointment? Yes  Can we respond through MyChart? No  Agent: Please be advised that Rx refills may take up to 3 business days. We ask that you follow-up with your pharmacy.

## 2023-07-01 ENCOUNTER — Telehealth: Payer: Self-pay | Admitting: Pharmacist

## 2023-07-01 ENCOUNTER — Other Ambulatory Visit: Payer: Medicare HMO

## 2023-07-01 NOTE — Telephone Encounter (Signed)
 Called pt for scheduled pharmacist follow up. Left voicemail with direct call back number.  Arbutus Leas, PharmD, BCPS, CPP Clinical Pharmacist Practitioner [facility] [phone]

## 2023-07-05 ENCOUNTER — Ambulatory Visit: Payer: Medicare HMO

## 2023-07-05 VITALS — Ht 71.0 in | Wt 255.0 lb

## 2023-07-05 DIAGNOSIS — Z122 Encounter for screening for malignant neoplasm of respiratory organs: Secondary | ICD-10-CM | POA: Diagnosis not present

## 2023-07-05 DIAGNOSIS — Z0000 Encounter for general adult medical examination without abnormal findings: Secondary | ICD-10-CM | POA: Diagnosis not present

## 2023-07-05 DIAGNOSIS — F17200 Nicotine dependence, unspecified, uncomplicated: Secondary | ICD-10-CM | POA: Diagnosis not present
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
## 2023-07-05 NOTE — Patient Instructions (Addendum)
 James Sawyer , Thank you for taking time to come for your Medicare Wellness Visit. I appreciate your ongoing commitment to your health goals. Please review the following plan we discussed and let me know if I can assist you in the future.   Referrals/Orders/Follow-Ups/Clinician Recommendations: Aim for 30 minutes of exercise or brisk walking, 6-8 glasses of water, and 5 servings of fruits and vegetables each day. Ordered a Lung Cancer Screening test due to being a current smoker.  This is a list of the screening recommended for you and due dates:  Health Maintenance  Topic Date Due   DTaP/Tdap/Td vaccine (2 - Tdap) 11/15/2019   COVID-19 Vaccine (2 - Pfizer risk series) 02/24/2021   Zoster (Shingles) Vaccine (1 of 2) 08/11/2023*   Medicare Annual Wellness Visit  07/04/2024   Colon Cancer Screening  03/16/2025   Pneumonia Vaccine  Completed   Flu Shot  Completed   Hepatitis C Screening  Completed   HPV Vaccine  Aged Out  *Topic was postponed. The date shown is not the original due date.    Advanced directives: (Declined) Advance directive discussed with you today. Even though you declined this today, please call our office should you change your mind, and we can give you the proper paperwork for you to fill out.  Next Medicare Annual Wellness Visit scheduled for next year: Yes

## 2023-11-11 ENCOUNTER — Ambulatory Visit: Payer: Medicare HMO | Admitting: Internal Medicine

## 2024-01-01 ENCOUNTER — Telehealth: Payer: Self-pay

## 2024-01-01 ENCOUNTER — Encounter: Payer: Self-pay | Admitting: Internal Medicine

## 2024-01-01 ENCOUNTER — Encounter: Payer: Self-pay | Admitting: *Deleted

## 2024-01-01 ENCOUNTER — Ambulatory Visit: Admitting: Internal Medicine

## 2024-01-01 VITALS — BP 160/92 | HR 88 | Temp 98.4°F | Resp 16 | Ht 71.0 in | Wt 251.8 lb

## 2024-01-01 DIAGNOSIS — Z23 Encounter for immunization: Secondary | ICD-10-CM

## 2024-01-01 DIAGNOSIS — R052 Subacute cough: Secondary | ICD-10-CM | POA: Diagnosis not present

## 2024-01-01 DIAGNOSIS — I2583 Coronary atherosclerosis due to lipid rich plaque: Secondary | ICD-10-CM

## 2024-01-01 DIAGNOSIS — E538 Deficiency of other specified B group vitamins: Secondary | ICD-10-CM

## 2024-01-01 DIAGNOSIS — J411 Mucopurulent chronic bronchitis: Secondary | ICD-10-CM

## 2024-01-01 DIAGNOSIS — N1832 Chronic kidney disease, stage 3b: Secondary | ICD-10-CM

## 2024-01-01 DIAGNOSIS — D75839 Thrombocytosis, unspecified: Secondary | ICD-10-CM

## 2024-01-01 DIAGNOSIS — R7303 Prediabetes: Secondary | ICD-10-CM

## 2024-01-01 DIAGNOSIS — E785 Hyperlipidemia, unspecified: Secondary | ICD-10-CM

## 2024-01-01 DIAGNOSIS — N401 Enlarged prostate with lower urinary tract symptoms: Secondary | ICD-10-CM

## 2024-01-01 DIAGNOSIS — F331 Major depressive disorder, recurrent, moderate: Secondary | ICD-10-CM | POA: Diagnosis not present

## 2024-01-01 DIAGNOSIS — I2510 Atherosclerotic heart disease of native coronary artery without angina pectoris: Secondary | ICD-10-CM

## 2024-01-01 DIAGNOSIS — R7989 Other specified abnormal findings of blood chemistry: Secondary | ICD-10-CM

## 2024-01-01 DIAGNOSIS — Z720 Tobacco use: Secondary | ICD-10-CM

## 2024-01-01 DIAGNOSIS — Z0001 Encounter for general adult medical examination with abnormal findings: Secondary | ICD-10-CM

## 2024-01-01 DIAGNOSIS — R0609 Other forms of dyspnea: Secondary | ICD-10-CM

## 2024-01-01 DIAGNOSIS — I10 Essential (primary) hypertension: Secondary | ICD-10-CM

## 2024-01-01 DIAGNOSIS — E1122 Type 2 diabetes mellitus with diabetic chronic kidney disease: Secondary | ICD-10-CM

## 2024-01-01 DIAGNOSIS — E519 Thiamine deficiency, unspecified: Secondary | ICD-10-CM

## 2024-01-01 LAB — URINALYSIS, ROUTINE W REFLEX MICROSCOPIC
Bilirubin Urine: NEGATIVE
Hgb urine dipstick: NEGATIVE
Ketones, ur: NEGATIVE
Leukocytes,Ua: NEGATIVE
Nitrite: NEGATIVE
Specific Gravity, Urine: 1.02 (ref 1.000–1.030)
Total Protein, Urine: NEGATIVE
Urine Glucose: NEGATIVE
Urobilinogen, UA: 0.2 (ref 0.0–1.0)
pH: 6.5 (ref 5.0–8.0)

## 2024-01-01 LAB — BASIC METABOLIC PANEL WITH GFR
BUN: 25 mg/dL — ABNORMAL HIGH (ref 6–23)
CO2: 27 meq/L (ref 19–32)
Calcium: 9.7 mg/dL (ref 8.4–10.5)
Chloride: 105 meq/L (ref 96–112)
Creatinine, Ser: 1.65 mg/dL — ABNORMAL HIGH (ref 0.40–1.50)
GFR: 41.05 mL/min — ABNORMAL LOW (ref >60.00)
Glucose, Bld: 121 mg/dL — ABNORMAL HIGH (ref 70–99)
Potassium: 3.5 meq/L (ref 3.5–5.1)
Sodium: 143 meq/L (ref 135–145)

## 2024-01-01 LAB — CBC WITH DIFFERENTIAL/PLATELET
Basophils Absolute: 0 K/uL (ref 0.0–0.1)
Basophils Relative: 0.4 % (ref 0.0–3.0)
Eosinophils Absolute: 0.1 K/uL (ref 0.0–0.7)
Eosinophils Relative: 2.4 % (ref 0.0–5.0)
HCT: 42.8 % (ref 39.0–52.0)
Hemoglobin: 14.3 g/dL (ref 13.0–17.0)
Lymphocytes Relative: 30.7 % (ref 12.0–46.0)
Lymphs Abs: 1.8 K/uL (ref 0.7–4.0)
MCHC: 33.4 g/dL (ref 30.0–36.0)
MCV: 93.5 fl (ref 78.0–100.0)
Monocytes Absolute: 0.4 K/uL (ref 0.1–1.0)
Monocytes Relative: 6.7 % (ref 3.0–12.0)
Neutro Abs: 3.5 K/uL (ref 1.4–7.7)
Neutrophils Relative %: 59.8 % (ref 43.0–77.0)
Platelets: 477 K/uL — ABNORMAL HIGH (ref 150.0–400.0)
RBC: 4.58 Mil/uL (ref 4.22–5.81)
RDW: 15.6 % — ABNORMAL HIGH (ref 11.5–15.5)
WBC: 5.8 K/uL (ref 4.0–10.5)

## 2024-01-01 LAB — LIPID PANEL
Cholesterol: 185 mg/dL (ref 0–200)
HDL: 42.1 mg/dL (ref >39.00)
LDL Cholesterol: 121 mg/dL — ABNORMAL HIGH (ref 0–99)
NonHDL: 142.63
Total CHOL/HDL Ratio: 4
Triglycerides: 106 mg/dL (ref 0.0–149.0)
VLDL: 21.2 mg/dL (ref 0.0–40.0)

## 2024-01-01 LAB — TSH: TSH: 2.44 u[IU]/mL (ref 0.35–5.50)

## 2024-01-01 LAB — PSA: PSA: 0.72 ng/mL (ref 0.10–4.00)

## 2024-01-01 LAB — TROPONIN I (HIGH SENSITIVITY): High Sens Troponin I: 61 ng/L (ref 2–17)

## 2024-01-01 LAB — HEMOGLOBIN A1C: Hgb A1c MFr Bld: 6.9 % — ABNORMAL HIGH (ref 4.6–6.5)

## 2024-01-01 MED ORDER — DULOXETINE HCL 30 MG PO CPEP: 30.0000 mg | ORAL_CAPSULE | Freq: Every day | ORAL | 0 refills | Status: AC

## 2024-01-01 MED ORDER — EZETIMIBE 10 MG PO TABS: 10.0000 mg | ORAL_TABLET | Freq: Every day | ORAL | 0 refills | Status: DC

## 2024-01-01 MED ORDER — ASPIRIN 81 MG PO TBEC: 81.0000 mg | DELAYED_RELEASE_TABLET | Freq: Every day | ORAL | 0 refills | Status: DC

## 2024-01-01 MED ORDER — YUPELRI 175 MCG/3ML IN SOLN: 175.0000 ug | Freq: Every day | RESPIRATORY_TRACT | 0 refills | Status: AC

## 2024-01-01 MED ORDER — SHINGRIX 50 MCG/0.5ML IM SUSR: 0.5000 mL | Freq: Once | INTRAMUSCULAR | 1 refills | Status: AC

## 2024-01-01 MED ORDER — BUPROPION HCL ER (XL) 150 MG PO TB24: 150.0000 mg | ORAL_TABLET | Freq: Every day | ORAL | 0 refills | Status: AC

## 2024-01-01 MED ORDER — ATORVASTATIN CALCIUM 80 MG PO TABS: 80.0000 mg | ORAL_TABLET | Freq: Every day | ORAL | 0 refills | Status: DC

## 2024-01-01 MED ORDER — FOLIC ACID 1 MG PO TABS: 1.0000 mg | ORAL_TABLET | Freq: Every day | ORAL | 0 refills | Status: AC

## 2024-01-01 MED ORDER — AMLODIPINE BESYLATE 10 MG PO TABS: 10.0000 mg | ORAL_TABLET | Freq: Every day | ORAL | 0 refills | Status: AC

## 2024-01-01 MED ORDER — COVID-19 MRNA VAC-TRIS(PFIZER) 30 MCG/0.3ML IM SUSY: 0.3000 mL | PREFILLED_SYRINGE | Freq: Once | INTRAMUSCULAR | 0 refills | Status: AC

## 2024-01-01 MED ORDER — EMPAGLIFLOZIN 10 MG PO TABS: 10.0000 mg | ORAL_TABLET | Freq: Every day | ORAL | 0 refills | Status: AC

## 2024-01-01 MED ORDER — BOOSTRIX 5-2.5-18.5 LF-MCG/0.5 IM SUSP: 0.5000 mL | Freq: Once | INTRAMUSCULAR | 0 refills | Status: AC

## 2024-01-01 MED ORDER — ASPIRIN 81 MG PO TBEC: 81.0000 mg | DELAYED_RELEASE_TABLET | Freq: Every day | ORAL | 0 refills | Status: AC

## 2024-01-01 NOTE — Telephone Encounter (Signed)
 CRITICAL VALUE STICKER  CRITICAL VALUE: Troponin @ 61  RECEIVER (on-site recipient of call): Luke HUNT RN  DATE & TIME NOTIFIED: [phone] 01/01/24  MESSENGER (representative from lab): [email or web address]   MD NOTIFIED: Joshua

## 2024-01-01 NOTE — Telephone Encounter (Signed)
 LVM to schedule annual LDCT. New referral received.

## 2024-01-01 NOTE — Progress Notes (Signed)
 SIGISMUND CROSS                                          MRN: [id number]   01/01/2024   The VBCI Quality Team Specialist reviewed this patient medical record for the purposes of chart review for care gap closure. The following were reviewed: chart review for care gap closure-controlling blood pressure.    VBCI Quality Team

## 2024-01-01 NOTE — Patient Instructions (Signed)
 Health Maintenance, Male  Adopting a healthy lifestyle and getting preventive care are important in promoting health and wellness. Ask your health care provider about:  The right schedule for you to have regular tests and exams.  Things you can do on your own to prevent diseases and keep yourself healthy.  What should I know about diet, weight, and exercise?  Eat a healthy diet    Eat a diet that includes plenty of vegetables, fruits, low-fat dairy products, and lean protein.  Do not eat a lot of foods that are high in solid fats, added sugars, or sodium.  Maintain a healthy weight  Body mass index (BMI) is a measurement that can be used to identify possible weight problems. It estimates body fat based on height and weight. Your health care provider can help determine your BMI and help you achieve or maintain a healthy weight.  Get regular exercise  Get regular exercise. This is one of the most important things you can do for your health. Most adults should:  Exercise for at least 150 minutes each week. The exercise should increase your heart rate and make you sweat (moderate-intensity exercise).  Do strengthening exercises at least twice a week. This is in addition to the moderate-intensity exercise.  Spend less time sitting. Even light physical activity can be beneficial.  Watch cholesterol and blood lipids  Have your blood tested for lipids and cholesterol at 73 years of age, then have this test every 5 years.  You may need to have your cholesterol levels checked more often if:  Your lipid or cholesterol levels are high.  You are older than 73 years of age.  You are at high risk for heart disease.  What should I know about cancer screening?  Many types of cancers can be detected early and may often be prevented. Depending on your health history and family history, you may need to have cancer screening at various ages. This may include screening for:  Colorectal cancer.  Prostate cancer.  Skin cancer.  Lung  cancer.  What should I know about heart disease, diabetes, and high blood pressure?  Blood pressure and heart disease  High blood pressure causes heart disease and increases the risk of stroke. This is more likely to develop in people who have high blood pressure readings or are overweight.  Talk with your health care provider about your target blood pressure readings.  Have your blood pressure checked:  Every 3-5 years if you are 24-52 years of age.  Every year if you are 3 years old or older.  If you are between the ages of 60 and 72 and are a current or former smoker, ask your health care provider if you should have a one-time screening for abdominal aortic aneurysm (AAA).  Diabetes  Have regular diabetes screenings. This checks your fasting blood sugar level. Have the screening done:  Once every three years after age 66 if you are at a normal weight and have a low risk for diabetes.  More often and at a younger age if you are overweight or have a high risk for diabetes.  What should I know about preventing infection?  Hepatitis B  If you have a higher risk for hepatitis B, you should be screened for this virus. Talk with your health care provider to find out if you are at risk for hepatitis B infection.  Hepatitis C  Blood testing is recommended for:  Everyone born from [birth year] through 1965.  Anyone  with known risk factors for hepatitis C.  Sexually transmitted infections (STIs)  You should be screened each year for STIs, including gonorrhea and chlamydia, if:  You are sexually active and are younger than 73 years of age.  You are older than 73 years of age and your health care provider tells you that you are at risk for this type of infection.  Your sexual activity has changed since you were last screened, and you are at increased risk for chlamydia or gonorrhea. Ask your health care provider if you are at risk.  Ask your health care provider about whether you are at high risk for HIV. Your health care provider  may recommend a prescription medicine to help prevent HIV infection. If you choose to take medicine to prevent HIV, you should first get tested for HIV. You should then be tested every 3 months for as long as you are taking the medicine.  Follow these instructions at home:  Alcohol use  Do not drink alcohol if your health care provider tells you not to drink.  If you drink alcohol:  Limit how much you have to 0-2 drinks a day.  Know how much alcohol is in your drink. In the [place] one drink equals one 12 oz bottle of beer (355 mL), one 5 oz glass of wine (148 mL), or one 1 oz glass of hard liquor (44 mL).  Lifestyle  Do not use any products that contain nicotine or tobacco. These products include cigarettes, chewing tobacco, and vaping devices, such as e-cigarettes. If you need help quitting, ask your health care provider.  Do not use street drugs.  Do not share needles.  Ask your health care provider for help if you need support or information about quitting drugs.  General instructions  Schedule regular health, dental, and eye exams.  Stay current with your vaccines.  Tell your health care provider if:  You often feel depressed.  You have ever been abused or do not feel safe at home.  Summary  Adopting a healthy lifestyle and getting preventive care are important in promoting health and wellness.  Follow your health care provider's instructions about healthy diet, exercising, and getting tested or screened for diseases.  Follow your health care provider's instructions on monitoring your cholesterol and blood pressure.  This information is not intended to replace advice given to you by your health care provider. Make sure you discuss any questions you have with your health care provider.  Document Revised: 08/15/2020 Document Reviewed: 08/15/2020  Elsevier Patient Education  2024 [employer].

## 2024-01-01 NOTE — Progress Notes (Addendum)
 "  Subjective:  Patient ID: James Sawyer, male    DOB: 1950/05/18  Age: 73 y.o. MRN: [id number]  CC: Wheezing (Heavy breathing ), Moods (Meaner, irritated really quickly, offended fast, ), Insomnia (Just about every night but he is sleeping during the daytime. ), Hypertension, Depression, Annual Exam, and COPD   HPI James Sawyer presents for a CPX and f/up ----   Discussed the use of AI scribe software for clinical note transcription with the patient, who gave verbal consent to proceed.  History of Present Illness James Sawyer is a 73 year old male who presents with fatigue and breathing difficulties.  He experiences fatigue and a lack of energy, accompanied by breathing difficulties, particularly noticeable during physical activities such as walking. There is no cough or chest pain, but he does experience wheezing. He is not using any inhalers for these symptoms.  He continues to smoke. There is a concern about possible depression, as he isolates himself in his room, sleeps during the day, and stays up at night. He also becomes easily irritated, particularly with family members, and blames them for his current situation.  He is currently taking eight medications but has been inconsistent with adherence, taking them every other day until recently when he started taking them daily. Despite elevated blood pressure readings, he denies experiencing headaches, blurred vision, or swelling in his legs or feet.  There are no thoughts of self-harm or harm to others, although he acknowledges feeling angry and frustrated, particularly in interactions with family members.    Outpatient Medications Prior to Visit  Medication Sig Dispense Refill   potassium chloride  (KLOR-CON  10) 10 MEQ tablet Take 1 tablet (10 mEq total) by mouth 2 (two) times daily. 180 tablet 1   amLODipine  (NORVASC ) 10 MG tablet Take 1 tablet (10 mg total) by mouth daily. 90 tablet 1   aspirin  EC 81 MG tablet Take 1 tablet (81 mg  total) by mouth daily. Swallow whole. 90 tablet 1   atorvastatin  (LIPITOR ) 80 MG tablet Take 1 tablet (80 mg total) by mouth daily. 90 tablet 1   buPROPion  (WELLBUTRIN  XL) 150 MG 24 hr tablet Take 1 tablet (150 mg total) by mouth daily. 90 tablet 1   DULoxetine  (CYMBALTA ) 30 MG capsule Take 1 capsule (30 mg total) by mouth daily. 90 capsule 1   ezetimibe  (ZETIA ) 10 MG tablet Take 1 tablet (10 mg total) by mouth daily. 90 tablet 0   folic acid  (FOLVITE ) 1 MG tablet Take 1 tablet (1 mg total) by mouth daily. 90 tablet 1   No facility-administered medications prior to visit.    ROS Review of Systems  Constitutional:  Positive for fatigue. Negative for appetite change, chills, diaphoresis and unexpected weight change.  HENT: Negative.    Eyes: Negative.   Respiratory:  Positive for apnea, shortness of breath and wheezing. Negative for cough, chest tightness and stridor.   Cardiovascular:  Negative for chest pain and leg swelling.  Gastrointestinal: Negative.  Negative for abdominal pain, constipation, diarrhea, nausea and vomiting.  Endocrine: Negative.   Genitourinary: Negative.  Negative for difficulty urinating and dysuria.  Musculoskeletal: Negative.  Negative for arthralgias and myalgias.  Skin: Negative.  Negative for color change and pallor.  Neurological: Negative.  Negative for dizziness, weakness, numbness and headaches.  Hematological:  Negative for adenopathy. Does not bruise/bleed easily.  Psychiatric/Behavioral:  Positive for confusion, decreased concentration, dysphoric mood and sleep disturbance. The patient is not nervous/anxious.     Objective:  BP (!) 160/92 (BP Location: Left Arm, Patient Position: Sitting, Cuff Size: Normal) Comment: BP (R) 164/88  Pulse 88   Temp 98.4 F (36.9 C) (Oral)   Resp 16   Ht 5' 11 (1.803 m)   Wt 251 lb 12.8 oz (114.2 kg)   SpO2 96%   BMI 35.12 kg/m   BP Readings from Last 3 Encounters:  01/01/24 (!) 160/92  05/13/23 (!) 140/78   01/08/23 134/76    Wt Readings from Last 3 Encounters:  01/01/24 251 lb 12.8 oz (114.2 kg)  07/05/23 255 lb (115.7 kg)  05/13/23 255 lb (115.7 kg)    Physical Exam Vitals reviewed.  Constitutional:      General: He is not in acute distress.    Appearance: He is not toxic-appearing or diaphoretic.  HENT:     Mouth/Throat:     Mouth: Mucous membranes are moist.  Eyes:     General: No scleral icterus.    Conjunctiva/sclera: Conjunctivae normal.  Cardiovascular:     Rate and Rhythm: Normal rate and regular rhythm.     Heart sounds: No murmur heard.    No friction rub. No gallop.     Comments: EKG- SR with 1st degree AV block, 81 bpm LAD NS IV block NS T wave changes Unchanged   Pulmonary:     Effort: Pulmonary effort is normal.     Breath sounds: No stridor. No decreased breath sounds, wheezing, rhonchi or rales.  Abdominal:     General: Abdomen is protuberant. Bowel sounds are normal. There is no distension.     Palpations: Abdomen is soft. There is no hepatomegaly, splenomegaly or mass.     Tenderness: There is no abdominal tenderness.  Musculoskeletal:        General: Normal range of motion.     Cervical back: Neck supple.     Right lower leg: No edema.     Left lower leg: No edema.  Lymphadenopathy:     Cervical: No cervical adenopathy.  Neurological:     General: No focal deficit present.     Mental Status: He is alert.  Psychiatric:        Attention and Perception: He is inattentive.        Mood and Affect: Mood normal. Mood is not anxious. Affect is flat. Affect is not blunt, angry or inappropriate.        Speech: Speech is delayed and tangential.        Behavior: Behavior normal.        Thought Content: Thought content normal. Thought content is not paranoid. Thought content does not include homicidal or suicidal ideation.        Cognition and Memory: Cognition is impaired. Memory is impaired.     Lab Results  Component Value Date   WBC 5.8  01/01/2024   HGB 14.3 01/01/2024   HCT 42.8 01/01/2024   PLT 477.0 (H) 01/01/2024   GLUCOSE 121 (H) 01/01/2024   CHOL 185 01/01/2024   TRIG 106.0 01/01/2024   HDL 42.10 01/01/2024   LDLDIRECT 145.0 08/26/2014   LDLCALC 121 (H) 01/01/2024   ALT 17 05/13/2023   AST 18 05/13/2023   NA 143 01/01/2024   K 3.5 01/01/2024   CL 105 01/01/2024   CREATININE 1.65 (H) 01/01/2024   BUN 25 (H) 01/01/2024   CO2 27 01/01/2024   TSH 2.44 01/01/2024   PSA 0.72 01/01/2024   INR 1.0 08/19/2018   HGBA1C 6.9 (H) 01/01/2024  ECHOCARDIOGRAM COMPLETE Result Date: 02/02/2021    ECHOCARDIOGRAM REPORT   Patient Name:   RENA SWEEDEN Date of Exam: 02/02/2021 Medical Rec #:  [id number]     Height:       70.0 in Accession #:    [id number]    Weight:       260.0 lb Date of Birth:  1951-03-04      BSA:          2.334 m Patient Age:    70 years      BP:           196/99 mmHg Patient Gender: M             HR:           59 bpm. Exam Location:  Inpatient Procedure: 2D Echo, Color Doppler, Cardiac Doppler and Intracardiac            Opacification Agent Indications:     I25.110 Atherosclerotic heart disease of native coronary artery                  with unstable angina pectoris  History:         Patient has prior history of Echocardiogram examinations, most                  recent 10/22/2014. CAD.  Sonographer:     Ellouise Mose RDCS Referring Phys:  [id number] MICHAEL E NORINS Diagnosing Phys: Salena Negri MD IMPRESSIONS  1. Left ventricular ejection fraction, by estimation, is 55 to 60%. The left ventricle has normal function. The left ventricle has no regional wall motion abnormalities. There is moderate concentric left ventricular hypertrophy. Left ventricular diastolic parameters are consistent with Grade I diastolic dysfunction (impaired relaxation).  2. Right ventricular systolic function is normal. The right ventricular size is normal.  3. The mitral valve is normal in structure. Mild mitral valve regurgitation.  4. The aortic  valve is tricuspid. There is moderate calcification of the aortic valve. There is mild thickening of the aortic valve. Aortic valve regurgitation is not visualized. Mild aortic valve sclerosis is present, with no evidence of aortic valve stenosis.  5. There is mild (Grade II) atheroma plaque involving the aortic root and ascending aorta.  6. The inferior vena cava is normal in size with <50% respiratory variability, suggesting right atrial pressure of 8 mmHg. FINDINGS  Left Ventricle: Left ventricular ejection fraction, by estimation, is 55 to 60%. The left ventricle has normal function. The left ventricle has no regional wall motion abnormalities. Definity  contrast agent was given IV to delineate the left ventricular  endocardial borders. The left ventricular internal cavity size was normal in size. There is moderate concentric left ventricular hypertrophy. Left ventricular diastolic parameters are consistent with Grade I diastolic dysfunction (impaired relaxation). Right Ventricle: The right ventricular size is normal. No increase in right ventricular wall thickness. Right ventricular systolic function is normal. Left Atrium: Left atrial size was normal in size. Right Atrium: Right atrial size was not assessed. Pericardium: There is no evidence of pericardial effusion. Mitral Valve: The mitral valve is normal in structure. There is mild thickening of the mitral valve leaflet(s). There is mild calcification of the mitral valve leaflet(s). Mild mitral annular calcification. Mild mitral valve regurgitation. Tricuspid Valve: The tricuspid valve is normal in structure. Tricuspid valve regurgitation is trivial. Aortic Valve: The aortic valve is tricuspid. There is moderate calcification of the aortic valve. There is mild thickening of the aortic valve. There  is mild aortic valve annular calcification. Aortic valve regurgitation is not visualized. Mild aortic valve sclerosis is present, with no evidence of aortic valve  stenosis. Pulmonic Valve: The pulmonic valve was grossly normal. Pulmonic valve regurgitation is not visualized. Aorta: The aortic root is normal in size and structure. There is mild (Grade II) atheroma plaque involving the aortic root and ascending aorta. Venous: The inferior vena cava is normal in size with less than 50% respiratory variability, suggesting right atrial pressure of 8 mmHg. IAS/Shunts: The atrial septum is grossly normal.  LEFT VENTRICLE PLAX 2D LVIDd:         5.10 cm      Diastology LVIDs:         3.60 cm      LV e' medial:    3.83 cm/s LV PW:         1.60 cm      LV E/e' medial:  13.8 LV IVS:        1.60 cm      LV e' lateral:   5.68 cm/s LVOT diam:     2.20 cm      LV E/e' lateral: 9.3 LV SV:         73 LV SV Index:   31 LVOT Area:     3.80 cm                              3D Volume EF: LV Volumes (MOD)            3D EF:        40 % LV vol d, MOD A2C: 110.0 ml LV EDV:       186 ml LV vol d, MOD A4C: 76.4 ml  LV ESV:       112 ml LV vol s, MOD A2C: 59.8 ml  LV SV:        74 ml LV vol s, MOD A4C: 44.0 ml LV SV MOD A2C:     50.2 ml LV SV MOD A4C:     76.4 ml LV SV MOD BP:      42.8 ml RIGHT VENTRICLE RV S prime:     8.61 cm/s TAPSE (M-mode): 1.4 cm LEFT ATRIUM             Index        RIGHT ATRIUM           Index LA diam:        4.30 cm 1.84 cm/m   RA Area:     11.50 cm LA Vol (A2C):   61.3 ml 26.27 ml/m  RA Volume:   22.50 ml  9.64 ml/m LA Vol (A4C):   29.0 ml 12.43 ml/m LA Biplane Vol: 42.8 ml 18.34 ml/m  AORTIC VALVE LVOT Vmax:   113.00 cm/s LVOT Vmean:  65.100 cm/s LVOT VTI:    0.193 m  AORTA Ao Root diam: 3.70 cm Ao Asc diam:  3.80 cm MITRAL VALVE MV Area (PHT): 3.68 cm    SHUNTS MV Decel Time: 206 msec    Systemic VTI:  0.19 m MV E velocity: 52.70 cm/s  Systemic Diam: 2.20 cm MV A velocity: 67.10 cm/s MV E/A ratio:  0.79 Salena Negri MD Electronically signed by Salena Negri MD Signature Date/Time: 02/02/2021/12:48:56 PM    Final    DG Chest 2 View Result Date: 02/01/2021 CLINICAL  DATA:  Shortness of breath EXAM: CHEST - 2 VIEW  COMPARISON:  08/19/2018 FINDINGS: The heart size and mediastinal contours are within normal limits. Both lungs are clear. The visualized skeletal structures are unremarkable. Mild chronic elevation right diaphragm. IMPRESSION: No active cardiopulmonary disease. Electronically Signed   By: Luke Bun M.D.   On: 02/01/2021 19:48   DG Chest 2 View Result Date: 01/02/2024 CLINICAL DATA:  Cough and wheezing for the past month. EXAM: DG CHEST 2V COMPARISON:  02/01/2021 and chest CT dated 04/08/2019. FINDINGS: Normal-sized heart. Tortuous and partially calcified thoracic aorta. Poor inspiration with minimal bibasilar atelectasis. Unremarkable bones. IMPRESSION: Poor inspiration with minimal bibasilar atelectasis. Electronically Signed   By: Elspeth Bathe M.D.   On: 01/02/2024 13:23     Assessment & Plan:  Need for immunization against influenza -     Flu vaccine HIGH DOSE PF(Fluzone Trivalent)  Essential hypertension- Will try to achieve better BP control. -     EKG 12-Lead -     amLODIPine  Besylate; Take 1 tablet (10 mg total) by mouth daily.  Dispense: 90 tablet; Refill: 0 -     Urinalysis, Routine w reflex microscopic; Future -     Basic metabolic panel with GFR; Future -     TSH; Future  Need for prophylactic vaccination with combined diphtheria-tetanus-pertussis (DTP) vaccine -     Boostrix ; Inject 0.5 mLs into the muscle once for 1 dose.  Dispense: 0.5 mL; Refill: 0  Need for prophylactic vaccination and inoculation against varicella -     Shingrix ; Inject 0.5 mLs into the muscle once for 1 dose.  Dispense: 0.5 mL; Refill: 1  Subacute cough -     DG Chest 2 View; Future  Moderate episode of recurrent major depressive disorder (HCC) -     DULoxetine  HCl; Take 1 capsule (30 mg total) by mouth daily.  Dispense: 30 capsule; Refill: 0 -     buPROPion  HCl ER (XL); Take 1 tablet (150 mg total) by mouth daily.  Dispense: 30 tablet; Refill: 0 -      AMB Referral VBCI Care Management  BPH associated with nocturia -     Urinalysis, Routine w reflex microscopic; Future -     PSA; Future  CKD stage 3b, GFR 30-44 ml/min (HCC) -     Urinalysis, Routine w reflex microscopic; Future -     Basic metabolic panel with GFR; Future -     Empagliflozin ; Take 1 tablet (10 mg total) by mouth daily before breakfast.  Dispense: 90 tablet; Refill: 0  Coronary artery disease due to lipid rich plaque -     Atorvastatin  Calcium ; Take 1 tablet (80 mg total) by mouth daily.  Dispense: 90 tablet; Refill: 0 -     amLODIPine  Besylate; Take 1 tablet (10 mg total) by mouth daily.  Dispense: 90 tablet; Refill: 0 -     Aspirin ; Take 1 tablet (81 mg total) by mouth daily. Swallow whole.  Dispense: 90 tablet; Refill: 0 -     AMB Referral VBCI Care Management -     Troponin I (High Sensitivity); Future -     Ezetimibe ; Take 1 tablet (10 mg total) by mouth daily.  Dispense: 90 tablet; Refill: 0 -     Troponin I (High Sensitivity); Future -     CT CORONARY MORPH W/CTA COR W/SCORE W/CA W/CM &/OR WO/CM; Future  Thrombocytosis -     CBC with Differential/Platelet; Future  Folate deficiency -     Folic Acid ; Take 1 tablet (1 mg total) by mouth daily.  Dispense: 90 tablet; Refill: 0 -     TSH; Future  Hyperlipidemia with target LDL less than 100 -     Atorvastatin  Calcium ; Take 1 tablet (80 mg total) by mouth daily.  Dispense: 90 tablet; Refill: 0 -     Lipid panel; Future -     Ezetimibe ; Take 1 tablet (10 mg total) by mouth daily.  Dispense: 90 tablet; Refill: 0  Encounter for general adult medical examination with abnormal findings -     COVID-19 mRNA Vac-TriS(Pfizer); Inject 0.3 mLs into the muscle once for 1 dose.  Dispense: 0.3 mL; Refill: 0  Chronic bronchitis with productive mucopurulent cough (HCC)- Will start a LAMA. -     AMB Referral VBCI Care Management -     Yupelri ; Take 3 mLs (175 mcg total) by nebulization daily.  Dispense: 90 mL; Refill:  0  Type 2 diabetes mellitus with stage 3a chronic kidney disease, without long-term current use of insulin (HCC)- Will start an SGLT2-inh. -     Empagliflozin ; Take 1 tablet (10 mg total) by mouth daily before breakfast.  Dispense: 90 tablet; Refill: 0  Tobacco abuse  Elevated troponin- Will evaluate with a CA CT. -     CT CORONARY MORPH W/CTA COR W/SCORE W/CA W/CM &/OR WO/CM; Future  DOE (dyspnea on exertion) -     CT CORONARY MORPH W/CTA COR W/SCORE W/CA W/CM &/OR WO/CM; Future     Follow-up: Return in about 3 months (around 04/01/2024).  Debby Molt, MD "

## 2024-01-02 ENCOUNTER — Ambulatory Visit (INDEPENDENT_AMBULATORY_CARE_PROVIDER_SITE_OTHER)

## 2024-01-02 DIAGNOSIS — R059 Cough, unspecified: Secondary | ICD-10-CM | POA: Diagnosis not present

## 2024-01-02 DIAGNOSIS — J9811 Atelectasis: Secondary | ICD-10-CM | POA: Diagnosis not present

## 2024-01-02 DIAGNOSIS — R052 Subacute cough: Secondary | ICD-10-CM | POA: Diagnosis not present

## 2024-01-02 DIAGNOSIS — R062 Wheezing: Secondary | ICD-10-CM | POA: Diagnosis not present

## 2024-01-02 DIAGNOSIS — I771 Stricture of artery: Secondary | ICD-10-CM | POA: Diagnosis not present

## 2024-01-03 ENCOUNTER — Telehealth: Payer: Self-pay | Admitting: Radiology

## 2024-01-03 NOTE — Telephone Encounter (Signed)
 Patient assistance needed for the Jardiance . Can you help?

## 2024-01-03 NOTE — Telephone Encounter (Signed)
 Noted

## 2024-01-03 NOTE — Telephone Encounter (Signed)
 Copied from CRM [id number]. Topic: Clinical - Prescription Issue >> Jan 02, 2024  1:33 PM Alfonso ORN wrote: Reason for CRM: Draco Malczewski wife stated that they are unable to pay for rx prescribed and no generic is available. Need assistance with a replacement.  Contact [phone] Marceil Call)

## 2024-01-06 ENCOUNTER — Telehealth: Payer: Self-pay | Admitting: *Deleted

## 2024-01-06 NOTE — Progress Notes (Unsigned)
 Complex Care Management Note Care Guide Note  01/06/2024 Name: James Sawyer MRN: [id number] DOB: Sep 08, 1950   Complex Care Management Outreach Attempts: An unsuccessful telephone outreach was attempted today to offer the patient information about available complex care management services.  Follow Up Plan:  Additional outreach attempts will be made to offer the patient complex care management information and services.   Encounter Outcome:  No Answer  Thedford Franks, CMA [place]  [facility], [facility] Guide Direct Dial: [phone]  Fax: [phone] Website: [email or web address]

## 2024-01-07 NOTE — Progress Notes (Unsigned)
 Complex Care Management Note Care Guide Note  01/07/2024 Name: James Sawyer MRN: [id number] DOB: [birth year]   Complex Care Management Outreach Attempts: A second unsuccessful outreach was attempted today to offer the patient with information about available complex care management services.  Follow Up Plan:  Additional outreach attempts will be made to offer the patient complex care management information and services.   Encounter Outcome:  No Answer  Thedford Franks, CMA [place]  [facility], [facility] Guide Direct Dial: [phone]  Fax: [phone] Website: [email or web address]

## 2024-01-08 NOTE — Progress Notes (Signed)
 Complex Care Management Note  Care Guide Note 01/08/2024 Name: James Sawyer MRN: [id number] DOB: [birth year]  James Sawyer is a 73 y.o. year old male who sees Joshua Debby CROME, MD for primary care. I reached out to Amelie JINNY Call by phone today to offer complex care management services.  [name] was given information about Complex Care Management services today including:   The Complex Care Management services include support from the care team which includes your Nurse Care Manager, Clinical Social Worker, or Pharmacist.  The Complex Care Management team is here to help remove barriers to the health concerns and goals most important to you. Complex Care Management services are voluntary, and the patient may decline or stop services at any time by request to their care team member.   Complex Care Management Consent Status: Patient agreed to services and verbal consent obtained.   Follow up plan:  Telephone appointment with complex care management team member scheduled for:  01/16/2024 and 01/23/2024  Encounter Outcome:  Patient Scheduled  Thedford Franks, CMA [place]  [facility], [facility] Guide Direct Dial: [phone]  Fax: [phone] Website: [email or web address]

## 2024-01-10 ENCOUNTER — Telehealth: Payer: Self-pay

## 2024-01-10 NOTE — Telephone Encounter (Signed)
 Reached out to patient to see if he came back int to have his repeated labs done. Unable to reach patient. LMTRC.

## 2024-01-13 ENCOUNTER — Other Ambulatory Visit: Payer: Self-pay | Admitting: Internal Medicine

## 2024-01-13 DIAGNOSIS — I2510 Atherosclerotic heart disease of native coronary artery without angina pectoris: Secondary | ICD-10-CM

## 2024-01-16 ENCOUNTER — Other Ambulatory Visit (INDEPENDENT_AMBULATORY_CARE_PROVIDER_SITE_OTHER): Admitting: Pharmacist

## 2024-01-16 DIAGNOSIS — I10 Essential (primary) hypertension: Secondary | ICD-10-CM

## 2024-01-16 NOTE — Progress Notes (Unsigned)
 01/16/2024 Name: James Sawyer MRN: [id number] DOB: 05-16-1950  Chief Complaint  Patient presents with   Medication Management    James Sawyer is a 73 y.o. year old male who presented for a telephone visit.   They were referred to the pharmacist by their PCP for assistance in managing CAD.    Subjective:  Care Team: Primary Care Provider: Joshua Debby CROME, MD ; Next Scheduled Visit: 04/06/24  Medication Access/Adherence  Current Pharmacy:  [facility] [address] ([address]), [place] - [address] [place] ([place]) [place] [address] Phone: [phone] Fax: [phone]  [facility] - [place], [place] - [address] [place] [address] Phone: [phone] Fax: [phone]   Patient reports affordability concerns with their medications: No  Patient reports access/transportation concerns to their pharmacy: No  Patient reports adherence concerns with their medications:  Yes    Medication nonadherence Amlodipine  10/20/23 30DS Atorvastatin  10/20/23 30DS Ezetimibe  09/21/23 30DS  Hypertension:  Current medications: amlodipine  10 mg daily Medications previously tried: valsartan /hydrochlorothiazide , irbesartan , lisinopril /hydrochlorothiazide , indapamide , Edarbyclor   Patient does not have a validated, automated, upper arm home BP cuff Current blood pressure readings readings: none   Hyperlipidemia/ASCVD Risk Reduction  Current lipid lowering medications: atorvastatin  80 mg daily, ezetimibe  10 mg daily Medications tried in the past: rosuvastatin   Antiplatelet regimen: aspirin  81 mg daily  ASCVD History: CAD s/p PCI 2016 Risk Factors: T2DM   Objective:  BP Readings from Last 3 Encounters:  01/01/24 (!) 160/92  05/13/23 (!) 140/78  01/08/23 134/76     Lab Results  Component Value Date   HGBA1C 6.9 (H) 01/01/2024    Lab Results  Component Value Date   CREATININE 1.52 (H) 01/17/2024   BUN 15 01/17/2024   NA 146 (H)  01/17/2024   K 3.4 (L) 01/17/2024   CL 109 01/17/2024   CO2 29 01/17/2024    Lab Results  Component Value Date   CHOL 185 01/01/2024   HDL 42.10 01/01/2024   LDLCALC 121 (H) 01/01/2024   LDLDIRECT 145.0 08/26/2014   TRIG 106.0 01/01/2024   CHOLHDL 4 01/01/2024    Medications Reviewed Today     Reviewed by Merceda Lela SAUNDERS, RPH (Pharmacist) on 01/16/24 at 1658  Med List Status: <None>   Medication Order Taking? Sig Documenting Provider Last Dose Status Informant  amLODipine  (NORVASC ) 10 MG tablet [id number]  Take 1 tablet (10 mg total) by mouth daily. Joshua Debby CROME, MD  Active   aspirin  EC 81 MG tablet [id number]  Take 1 tablet (81 mg total) by mouth daily. Swallow whole. Joshua Debby CROME, MD  Active   atorvastatin  (LIPITOR ) 80 MG tablet [id number]  Take 1 tablet (80 mg total) by mouth daily. Joshua Debby CROME, MD  Active   buPROPion  (WELLBUTRIN  XL) 150 MG 24 hr tablet [id number]  Take 1 tablet (150 mg total) by mouth daily. Joshua Debby CROME, MD  Active   DULoxetine  (CYMBALTA ) 30 MG capsule [id number]  Take 1 capsule (30 mg total) by mouth daily. Joshua Debby CROME, MD  Active   empagliflozin  (JARDIANCE ) 10 MG TABS tablet [id number]  Take 1 tablet (10 mg total) by mouth daily before breakfast. Joshua Debby CROME, MD  Active   ezetimibe  (ZETIA ) 10 MG tablet [id number]  Take 1 tablet (10 mg total) by mouth daily. Joshua Debby CROME, MD  Active   folic acid  (FOLVITE ) 1 MG tablet [id number]  Take 1 tablet (1 mg total) by mouth daily.  Joshua Debby CROME, MD  Active   potassium chloride  (KLOR-CON  10) 10 MEQ tablet [id number]  Take 1 tablet (10 mEq total) by mouth 2 (two) times daily. Joshua Debby CROME, MD  Active   revefenacin  (YUPELRI ) 175 MCG/3ML nebulizer solution [id number]  Take 3 mLs (175 mcg total) by nebulization daily. Joshua Debby CROME, MD  Active   Discontinued 03/29/11 1006 (Cost of medication)               Assessment/Plan:   Hypertension: - Currently uncontrolled, BP goal <130/80 -  Reviewed long term cardiovascular and renal outcomes of uncontrolled blood pressure - Reviewed importance of adherence to medications - Recommend to continue amlodipine . Recommend ACE/ARB given hx CAD - Recommend updated UACR   Hyperlipidemia/ASCVD Risk Reduction: - Currently uncontrolled. LDL goal <55 - Reviewed long term complications of uncontrolled cholesterol - Reviewed importance of medication adherence - Recommend to continue atorvastatin  and ezetimibe     Follow Up Plan: Walk in lab tomorrow  Darrelyn Drum, PharmD, BCPS, CPP Clinical Pharmacist Practitioner [facility] at [facility] [phone]

## 2024-01-17 ENCOUNTER — Other Ambulatory Visit

## 2024-01-17 DIAGNOSIS — I10 Essential (primary) hypertension: Secondary | ICD-10-CM

## 2024-01-17 LAB — BASIC METABOLIC PANEL WITH GFR
BUN: 15 mg/dL (ref 6–23)
CO2: 29 meq/L (ref 19–32)
Calcium: 8.8 mg/dL (ref 8.4–10.5)
Chloride: 109 meq/L (ref 96–112)
Creatinine, Ser: 1.52 mg/dL — ABNORMAL HIGH (ref 0.40–1.50)
GFR: 45.28 mL/min — ABNORMAL LOW (ref >60.00)
Glucose, Bld: 89 mg/dL (ref 70–99)
Potassium: 3.4 meq/L — ABNORMAL LOW (ref 3.5–5.1)
Sodium: 146 meq/L — ABNORMAL HIGH (ref 135–145)

## 2024-01-21 ENCOUNTER — Ambulatory Visit: Payer: Self-pay | Admitting: Pharmacist

## 2024-01-21 DIAGNOSIS — E876 Hypokalemia: Secondary | ICD-10-CM

## 2024-01-21 DIAGNOSIS — I10 Essential (primary) hypertension: Secondary | ICD-10-CM

## 2024-01-21 MED ORDER — POTASSIUM CHLORIDE ER 10 MEQ PO TBCR: 10.0000 meq | EXTENDED_RELEASE_TABLET | Freq: Two times a day (BID) | ORAL | 0 refills | Status: AC

## 2024-01-23 ENCOUNTER — Telehealth: Payer: Self-pay | Admitting: *Deleted

## 2024-01-23 ENCOUNTER — Encounter: Payer: Self-pay | Admitting: *Deleted

## 2024-01-23 NOTE — Progress Notes (Signed)
 James Sawyer                                          MRN: [id number]   01/23/2024   The VBCI Quality Team Specialist reviewed this patient medical record for the purposes of chart review for care gap closure. The following were reviewed: chart review for care gap closure-controlling blood pressure.    VBCI Quality Team

## 2024-02-05 ENCOUNTER — Telehealth: Payer: Self-pay | Admitting: *Deleted

## 2024-02-05 NOTE — Progress Notes (Signed)
 Complex Care Management Care Guide Note  02/05/2024 Name: James Sawyer MRN: [id number] DOB: [birth year]  James Sawyer is a 73 y.o. year old male who is a primary care patient of Joshua Debby CROME, MD and is actively engaged with the care management team. I reached out to Amelie JINNY Call by phone today to assist with re-scheduling  with the Licensed Clinical Child Psychotherapist.  Follow up plan: Unsuccessful telephone outreach attempt made. A HIPAA compliant phone message was left for the patient providing contact information and requesting a return call.  Thedford Franks, CMA [place]  [facility], [facility] Guide Direct Dial: [phone]  Fax: [phone] Website: [email or web address]

## 2024-02-11 ENCOUNTER — Encounter: Payer: Self-pay | Admitting: Cardiology

## 2024-02-11 ENCOUNTER — Ambulatory Visit: Attending: Cardiology | Admitting: Cardiology

## 2024-02-11 VITALS — BP 144/92 | HR 70 | Ht 71.0 in | Wt 252.2 lb

## 2024-02-11 DIAGNOSIS — R5383 Other fatigue: Secondary | ICD-10-CM

## 2024-02-11 DIAGNOSIS — Z720 Tobacco use: Secondary | ICD-10-CM

## 2024-02-11 DIAGNOSIS — E785 Hyperlipidemia, unspecified: Secondary | ICD-10-CM | POA: Diagnosis not present

## 2024-02-11 DIAGNOSIS — I10 Essential (primary) hypertension: Secondary | ICD-10-CM

## 2024-02-11 DIAGNOSIS — G4733 Obstructive sleep apnea (adult) (pediatric): Secondary | ICD-10-CM

## 2024-02-11 DIAGNOSIS — I2510 Atherosclerotic heart disease of native coronary artery without angina pectoris: Secondary | ICD-10-CM | POA: Diagnosis not present

## 2024-02-11 DIAGNOSIS — E782 Mixed hyperlipidemia: Secondary | ICD-10-CM | POA: Diagnosis not present

## 2024-02-11 NOTE — Progress Notes (Signed)
 Cardiology Office Note:  .   Date:  02/11/2024  ID:  James Sawyer, DOB Sep 29, 1950, MRN [id number] PCP: Joshua Debby CROME, MD  [facility] Providers Cardiologist:  Vina Gull, MD {  History of Present Illness: .   James Sawyer is a 73 y.o. male with history of CAD with DES to RCA 2016 with residual mild CAD, 2:1 AVB while sleeping in 2016, bradycardia (not on BB due to this), CKD stage 3, HLD, diabetes, tobacco abuse, HTN, morbid obesity, severe OSA, depression.     CAD 2016 had abnormal EKG/Myoview  prompting cardiac catheterization with proximal RCA lesion followed by 90% stenosis successful DES.  Complicated by 2:1 AV block occurring nocturnally related to severe OSA. 01/2021 admitted with DOE, mildly elevated troponins felt related to poorly controlled blood pressure.  Stress test normal.  EF measured to be 36% on stress but TTE visually was normal.  Had 7 beats of wide-complex tachycardia in setting of hypokalemia.   Social history  Smoker since his teenage years.  1.5 pack/week.  Occasional marijuana use. Not active     Patient with history of CAD with DES to RCA.  Last seen in office 02/2021 without any acute complaints.  We have been titrating his hypertensive meds and had increased amlodipine  from 5 mg to 10 mg.  Still pending sleep study.  He was seen 12/2023 by his PCP complaining of lack of energy and depressive like symptoms.  He was isolating himself in the room and sleeping for long periods of time.  Inconsistent with medication adherence.  Coronary CTA was ordered for a troponin of 61.  Chronically has some degree of troponin elevation.  Today patient presents for overdue follow-up and has not been seen in the last 3 years by cardiology.  He reports for the last 6 to 8 months he has had fatigue with a lack of energy.  He is not very active and really the most exertional thing he does is taking out the garbage.  However he finds that he has worsening lack of energy with this  and with accompanying shortness of breath.  Denied any orthopnea, peripheral edema, chest pain.  He has been loosely compliant with his medications and often times forget.  He says he is a little bit better about this since following up with his PCP in September but still inconsistent.  Also for the past couple months he has been reporting dark stools.  Denies any frank bleeding though.  Additionally he raises some concerns that he is financially limited and only has about $700 of income after paying for rent.  ROS: Denies: Chest pain, shortness of breath, orthopnea, peripheral edema, palpitations, decreased exercise intolerance, fatigue, lightheadedness.   Studies Reviewed: SABRA    EKG Interpretation Date/Time:  Tuesday February 11 2024 14:11:01 EST Ventricular Rate:  67 PR Interval:  252 QRS Duration:  164 QT Interval:  450 QTC Calculation: 475 R Axis:   -65  Text Interpretation: Sinus rhythm with 1st degree A-V block Left axis deviation Right bundle branch block New since previous tracing T wave abnormality, consider inferolateral ischemia When compared with ECG of 03-Feb-2021 03:21, chronic appearing inferior lateral t wave changes. no significant changes. Confirmed by Darryle Currier [phone]) on 02/11/2024 2:15:13 PM    Risk Assessment/Calculations:       Physical Exam:   VS:  BP (!) 144/92   Pulse 70   Ht 5' 11 (1.803 m)   Wt 252 lb 3.2 oz (114.4 kg)  SpO2 95%   BMI 35.17 kg/m    Wt Readings from Last 3 Encounters:  02/11/24 252 lb 3.2 oz (114.4 kg)  01/01/24 251 lb 12.8 oz (114.2 kg)  07/05/23 255 lb (115.7 kg)    GEN: Well nourished, well developed in no acute distress NECK: No JVD; No carotid bruits CARDIAC: RRR, no murmurs, rubs, gallops RESPIRATORY:  Clear to auscultation without rales, wheezing or rhonchi  ABDOMEN: Soft, non-tender, non-distended EXTREMITIES:  No edema; No deformity   ASSESSMENT AND PLAN: .    Fatigue/SOB CAD Hyperlipidemia - 2016 DES to RCA -  01/2021 normal stress test For the past 6 to 8 months patient reporting worsening complaints of fatigue/shortness of breath.  Looks euvolemic.  Has had no chest pain. EKG with chronic appearing inferolateral T wave inversions, slightly improved.  Differential for his fatigue is extensive, clinically has a flat affect and signs of depression that he feels have been blown out of proportion.  However with his history of CAD with stent placement CAD certainly on the differential.  Could also be related to untreated, severe OSA.  Nonetheless he is high risk with possible anginal equivalents that warrant further evaluation. Ordering PET stress test, echo, obtain CBC, BMP. Continue with aspirin , atorvastatin  80 mg, Zetia .  Avoid beta-blocker as below.  LDL is not well-controlled.  He has been loosely compliant with his medications for the past several months. 12/2023 LDL was 121.  Goal should be less than 55.  Repeat fasting lipid panel in 2 months when he has demonstrated better compliancy.  If not at target goal will need additional therapies.  Possibly PCSK9 inhibitor. PCP had ordered coronary CTA.  Since he has stents, he is likely to have artifact with this.  Would cancel. ER precautions given.  History of 2:1 AV block Noted nocturnally after heart catheterization 2016.  Avoid beta-blockers.  Denies any complaints of syncope, lightheadedness, dizziness.  Tobacco abuse 1.5 pack/week.  Encouraged cessation.  Hypertension Not at target goal less than 130 systolic although again noncompliant with medications.  144/92. Take blood pressure daily for the next week and send results to us  through MyChart. Continue with amlodipine  10 mg for now.  Severe OSA Documented on sleep study 03/2021.  Not on CPAP, he is reluctant to try this but discussed the concerns of long-term deleterious effects.  Will try to arrange CPAP if he is willing. Falling asleep in the room.   Type 2 diabetes A1c 6.9%.  On  Jardiance   Dark stools No obvious signs of bleeding.  Order CBC.  PCP to follow-up.    Dispo: 2-month follow-up with Dr. Okey or myself to go over studies.  Signed, Thom LITTIE Sluder, PA-C

## 2024-02-11 NOTE — Patient Instructions (Signed)
 Medication Instructions:  none *If you need a refill on your cardiac medications before your next appointment, please call your pharmacy*  Lab Work: Today BMP, CBC. In 2 months a fasting lipid panel If you have labs (blood work) drawn today and your tests are completely normal, you will receive your results only by: MyChart Message (if you have MyChart) OR A paper copy in the mail If you have any lab test that is abnormal or we need to change your treatment, we will call you to review the results.  Testing/Procedures:  Echocardiogram  Your physician has requested that you have an echocardiogram. Echocardiography is a painless test that uses sound waves to create images of your heart. It provides your doctor with information about the size and shape of your heart and how well your heart's chambers and valves are working. This procedure takes approximately one hour. There are no restrictions for this procedure. Please do NOT wear cologne, perfume, aftershave, or lotions (deodorant is allowed). Please arrive 15 minutes prior to your appointment time.  Please note: We ask at that you not bring children with you during ultrasound (echo/ vascular) testing. Due to room size and safety concerns, children are not allowed in the ultrasound rooms during exams. Our front office staff cannot provide observation of children in our lobby area while testing is being conducted. An adult accompanying a patient to their appointment will only be allowed in the ultrasound room at the discretion of the ultrasound technician under special circumstances. We apologize for any inconvenience.     PET Stress    Please report to Radiology at the [facility] Main Entrance 30 minutes early for your test.  [address] [place], [place] [address]                         OR   Please report to Radiology at [facility] Main Entrance, medical mall, 30 mins prior to your test.  [address]  [place], [place]  How to Prepare for Your Cardiac PET/CT Stress Test:  Nothing to eat or drink, except water, 3 hours prior to arrival time.  NO caffeine/decaffeinated products, or chocolate 12 hours prior to arrival. (Please note decaffeinated beverages (teas/coffees) still contain caffeine).  If you have caffeine within 12 hours prior, the test will need to be rescheduled.  Medication instructions: Do not take erectile dysfunction medications for 72 hours prior to test (sildenafil, tadalafil ) Do not take nitrates (isosorbide mononitrate, Ranexa) the day before or day of test Do not take tamsulosin  the day before or morning of test Hold theophylline containing medications for 12 hours. Hold Dipyridamole 48 hours prior to the test.  Diabetic Preparation: If able to eat breakfast prior to 3 hour fasting, you may take all medications, including your insulin. Do not worry if you miss your breakfast dose of insulin - start at your next meal. If you do not eat prior to 3 hour fast-Hold all diabetes (oral and insulin) medications. Patients who wear a continuous glucose monitor MUST remove the device prior to scanning.  You may take your remaining medications with water.  NO perfume, cologne or lotion on chest or abdomen area. FEMALES - Please avoid wearing dresses to this appointment.  Total time is 1 to 2 hours; you may want to bring reading material for the waiting time.  IF YOU THINK YOU MAY BE PREGNANT, OR ARE NURSING PLEASE INFORM THE TECHNOLOGIST.  In preparation for your appointment, medication and supplies will be purchased.  Appointment availability is limited, so if you need to cancel or reschedule, please call the Radiology Department Scheduler at [phone] 24 hours in advance to avoid a cancellation fee of $100.00  What to Expect When you Arrive:  Once you arrive and check in for your appointment, you will be taken to a preparation room within the Radiology  Department.  A technologist or Nurse will obtain your medical history, verify that you are correctly prepped for the exam, and explain the procedure.  Afterwards, an IV will be started in your arm and electrodes will be placed on your skin for EKG monitoring during the stress portion of the exam. Then you will be escorted to the PET/CT scanner.  There, staff will get you positioned on the scanner and obtain a blood pressure and EKG.  During the exam, you will continue to be connected to the EKG and blood pressure machines.  A small, safe amount of a radioactive tracer will be injected in your IV to obtain a series of pictures of your heart along with an injection of a stress agent.    After your Exam:  It is recommended that you eat a meal and drink a caffeinated beverage to counter act any effects of the stress agent.  Drink plenty of fluids for the remainder of the day and urinate frequently for the first couple of hours after the exam.  Your doctor will inform you of your test results within 7-10 business days.  For more information and frequently asked questions, please visit our website: [email or web address]  For questions about your test or how to prepare for your test, please call: Cardiac Imaging Nurse Navigators Office: [phone]  Follow-Up: At [facility], you and your health needs are our priority.  As part of our continuing mission to provide you with exceptional heart care, our providers are all part of one team.  This team includes your primary Cardiologist (physician) and Advanced Practice Providers or APPs (Physician Assistants and Nurse Practitioners) who all work together to provide you with the care you need, when you need it.  Your next appointment:   3 month(s) , to be scheduled after the PET Stress Test.  Provider:   Vina Gull, MD    We recommend signing up for the patient portal called MyChart.  Sign up information is provided on this After  Visit Summary.  MyChart is used to connect with patients for Virtual Visits (Telemedicine).  Patients are able to view lab/test results, encounter notes, upcoming appointments, etc.  Non-urgent messages can be sent to your provider as well.   To learn more about what you can do with MyChart, go to [email or web address].   Other Instructions Check blood pressure daily for 2 weeks and bring the readings to the office.

## 2024-02-12 ENCOUNTER — Telehealth: Payer: Self-pay | Admitting: *Deleted

## 2024-02-12 ENCOUNTER — Ambulatory Visit: Payer: Self-pay | Admitting: Cardiology

## 2024-02-12 DIAGNOSIS — I422 Other hypertrophic cardiomyopathy: Secondary | ICD-10-CM

## 2024-02-12 DIAGNOSIS — I10 Essential (primary) hypertension: Secondary | ICD-10-CM

## 2024-02-12 DIAGNOSIS — G4733 Obstructive sleep apnea (adult) (pediatric): Secondary | ICD-10-CM

## 2024-02-12 DIAGNOSIS — I2510 Atherosclerotic heart disease of native coronary artery without angina pectoris: Secondary | ICD-10-CM

## 2024-02-12 LAB — BASIC METABOLIC PANEL WITH GFR
BUN/Creatinine Ratio: 11 (ref 10–24)
BUN: 20 mg/dL (ref 8–27)
CO2: 26 mmol/L (ref 20–29)
Calcium: 9.8 mg/dL (ref 8.6–10.2)
Chloride: 105 mmol/L (ref 96–106)
Creatinine, Ser: 1.83 mg/dL — AB (ref 0.76–1.27)
Glucose: 103 mg/dL — AB (ref 70–99)
Potassium: 4.4 mmol/L (ref 3.5–5.2)
Sodium: 147 mmol/L — AB (ref 134–144)
eGFR: 38 mL/min/1.73 — AB (ref >59)

## 2024-02-12 LAB — CBC
Hematocrit: 41.7 % (ref 37.5–51.0)
Hemoglobin: 13.4 g/dL (ref 13.0–17.7)
MCH: 30.7 pg (ref 26.6–33.0)
MCHC: 32.1 g/dL (ref 31.5–35.7)
MCV: 96 fL (ref 79–97)
Platelets: 501 x10E3/uL — ABNORMAL HIGH (ref 150–450)
RBC: 4.36 x10E6/uL (ref 4.14–5.80)
RDW: 14.1 % (ref 11.6–15.4)
WBC: 5.7 x10E3/uL (ref 3.4–10.8)

## 2024-02-12 NOTE — Telephone Encounter (Signed)
 Pt returned call to f/u please advise

## 2024-02-12 NOTE — Telephone Encounter (Signed)
 Spoke to [place] at [employer] and she says to send over an updated Rx and an updated ONO order and she will send to the PAP intake team to get started on setting the patient up. They will call him. Patient is required to put a card on file for 10 months. After a 10 month rental the card is removed from the account.

## 2024-02-12 NOTE — Telephone Encounter (Signed)
 LVM asking pt to call our office to discuss lab results. 2nd attempt

## 2024-02-21 NOTE — Progress Notes (Signed)
 Complex Care Management Care Guide Note  02/21/2024 Name: James Sawyer MRN: [id number] DOB: July 11, 1950  James Sawyer is a 73 y.o. year old male who is a primary care patient of Joshua Debby CROME, MD and is actively engaged with the care management team. I reached out to Amelie JINNY Call by phone today to assist with re-scheduling  with the Licensed Clinical Child Psychotherapist.  Follow up plan: 03/24/2024  Thedford Franks, CMA [place]  [facility], [facility] Guide Direct Dial: [phone]  Fax: [phone] Website: [email or web address]

## 2024-03-23 ENCOUNTER — Encounter (HOSPITAL_COMMUNITY): Payer: Self-pay

## 2024-03-23 ENCOUNTER — Ambulatory Visit (HOSPITAL_COMMUNITY)
Admission: RE | Admit: 2024-03-23 | Discharge: 2024-03-23 | Disposition: A | Source: Ambulatory Visit | Attending: Cardiology

## 2024-03-23 DIAGNOSIS — I2510 Atherosclerotic heart disease of native coronary artery without angina pectoris: Secondary | ICD-10-CM | POA: Insufficient documentation

## 2024-03-23 LAB — ECHOCARDIOGRAM COMPLETE
Area-P 1/2: 3.03 cm2
S' Lateral: 3.6 cm

## 2024-03-23 MED ORDER — PERFLUTREN LIPID MICROSPHERE
1.0000 mL | INTRAVENOUS | Status: AC | PRN
  Administered 2024-03-23: 12:00:00 2 mL via INTRAVENOUS

## 2024-03-24 ENCOUNTER — Telehealth: Payer: Self-pay | Admitting: *Deleted

## 2024-03-25 ENCOUNTER — Telehealth: Payer: Self-pay

## 2024-03-31 NOTE — Addendum Note (Signed)
 Addended by: Lacora Folmer on: 03/31/2024 11:18 AM   Modules accepted: Orders

## 2024-04-03 NOTE — Progress Notes (Signed)
 James Sawyer                                          MRN: [id number]   04/03/2024   The VBCI Quality Team Specialist reviewed this patient medical record for the purposes of chart review for care gap closure. The following were reviewed: chart review for care gap closure-controlling blood pressure.    VBCI Quality Team

## 2024-04-06 ENCOUNTER — Ambulatory Visit: Admitting: Internal Medicine

## 2024-04-06 ENCOUNTER — Ambulatory Visit: Payer: Self-pay | Admitting: Internal Medicine

## 2024-04-06 ENCOUNTER — Encounter: Payer: Self-pay | Admitting: Internal Medicine

## 2024-04-06 VITALS — BP 136/86 | HR 84 | Temp 98.3°F | Ht 71.0 in | Wt 253.2 lb

## 2024-04-06 DIAGNOSIS — I2583 Coronary atherosclerosis due to lipid rich plaque: Secondary | ICD-10-CM | POA: Diagnosis not present

## 2024-04-06 DIAGNOSIS — I10 Essential (primary) hypertension: Secondary | ICD-10-CM

## 2024-04-06 DIAGNOSIS — I2510 Atherosclerotic heart disease of native coronary artery without angina pectoris: Secondary | ICD-10-CM

## 2024-04-06 DIAGNOSIS — N1832 Chronic kidney disease, stage 3b: Secondary | ICD-10-CM

## 2024-04-06 DIAGNOSIS — E1122 Type 2 diabetes mellitus with diabetic chronic kidney disease: Secondary | ICD-10-CM

## 2024-04-06 DIAGNOSIS — N1831 Chronic kidney disease, stage 3a: Secondary | ICD-10-CM | POA: Diagnosis not present

## 2024-04-06 DIAGNOSIS — E785 Hyperlipidemia, unspecified: Secondary | ICD-10-CM | POA: Diagnosis not present

## 2024-04-06 LAB — URINALYSIS, ROUTINE W REFLEX MICROSCOPIC
Bilirubin Urine: NEGATIVE
Hgb urine dipstick: NEGATIVE
Ketones, ur: NEGATIVE
Leukocytes,Ua: NEGATIVE
Nitrite: NEGATIVE
RBC / HPF: NONE SEEN
Specific Gravity, Urine: 1.015 (ref 1.000–1.030)
Total Protein, Urine: NEGATIVE
Urine Glucose: NEGATIVE
Urobilinogen, UA: 0.2 (ref 0.0–1.0)
pH: 7 (ref 5.0–8.0)

## 2024-04-06 LAB — BASIC METABOLIC PANEL WITH GFR
BUN: 22 mg/dL (ref 6–23)
CO2: 29 meq/L (ref 19–32)
Calcium: 9 mg/dL (ref 8.4–10.5)
Chloride: 106 meq/L (ref 96–112)
Creatinine, Ser: 1.68 mg/dL — ABNORMAL HIGH (ref 0.40–1.50)
GFR: 40.1 mL/min — ABNORMAL LOW
Glucose, Bld: 142 mg/dL — ABNORMAL HIGH (ref 70–99)
Potassium: 3.8 meq/L (ref 3.5–5.1)
Sodium: 145 meq/L (ref 135–145)

## 2024-04-06 LAB — MICROALBUMIN / CREATININE URINE RATIO
Creatinine,U: 178.2 mg/dL
Microalb Creat Ratio: 17 mg/g (ref 0.0–30.0)
Microalb, Ur: 3 mg/dL — ABNORMAL HIGH (ref 0.7–1.9)

## 2024-04-06 LAB — HEMOGLOBIN A1C: Hgb A1c MFr Bld: 6.3 % (ref 4.6–6.5)

## 2024-04-06 MED ORDER — EZETIMIBE 10 MG PO TABS: 10.0000 mg | ORAL_TABLET | Freq: Every day | ORAL | 0 refills | Status: AC

## 2024-04-06 MED ORDER — ATORVASTATIN CALCIUM 80 MG PO TABS: 80.0000 mg | ORAL_TABLET | Freq: Every day | ORAL | 0 refills | Status: AC

## 2024-04-06 NOTE — Progress Notes (Signed)
 "     Subjective:  Patient ID: James Sawyer, male    DOB: Jun 11, 1950  Age: 73 y.o. MRN: [id number]  CC: Hypertension   HPI James Sawyer presents for f/up ----  Discussed the use of AI scribe software for clinical note transcription with the patient, who gave verbal consent to proceed.  History of Present Illness James Sawyer is a 73 year old male who presents with shortness of breath on exertion.  He experiences shortness of breath during activities such as rolling the trash can to the street. He denies chest pain and swelling in the legs or feet. He has not been staying active and spends most of his time watching TV.  He denies symptoms related to blood sugar fluctuations such as excessive thirst, excessive urination, or changes in weight or appetite.  He experiences occasional leg cramps when walking, which he alleviates by placing his foot on the floor or against a wall.  He has a bothersome big toenail that causes discomfort when pressure is applied or when it gets caught in covers.  He quit smoking seven days ago, having gradually reduced his consumption over time. He has not used nicotine  patches recently to aid in quitting. He is uncertain if his breathing has improved since quitting smoking.  He is not using any inhalers and does not feel the need for one.     Outpatient Medications Prior to Visit  Medication Sig Dispense Refill   amLODipine  (NORVASC ) 10 MG tablet Take 1 tablet (10 mg total) by mouth daily. 90 tablet 0   aspirin  EC 81 MG tablet Take 1 tablet (81 mg total) by mouth daily. Swallow whole. 90 tablet 0   buPROPion  (WELLBUTRIN  XL) 150 MG 24 hr tablet Take 1 tablet (150 mg total) by mouth daily. 30 tablet 0   DULoxetine  (CYMBALTA ) 30 MG capsule Take 1 capsule (30 mg total) by mouth daily. 30 capsule 0   empagliflozin  (JARDIANCE ) 10 MG TABS tablet Take 1 tablet (10 mg total) by mouth daily before breakfast. 90 tablet 0   folic acid  (FOLVITE ) 1 MG tablet Take 1  tablet (1 mg total) by mouth daily. 90 tablet 0   potassium chloride  (KLOR-CON  10) 10 MEQ tablet Take 1 tablet (10 mEq total) by mouth 2 (two) times daily. 180 tablet 0   revefenacin  (YUPELRI ) 175 MCG/3ML nebulizer solution Take 3 mLs (175 mcg total) by nebulization daily. 90 mL 0   atorvastatin  (LIPITOR ) 80 MG tablet Take 1 tablet (80 mg total) by mouth daily. 90 tablet 0   ezetimibe  (ZETIA ) 10 MG tablet Take 1 tablet (10 mg total) by mouth daily. 90 tablet 0   No facility-administered medications prior to visit.    ROS Review of Systems  Constitutional:  Negative for appetite change, chills, diaphoresis, fatigue and fever.  HENT: Negative.  Negative for trouble swallowing.   Eyes: Negative.   Respiratory:  Positive for shortness of breath. Negative for cough, chest tightness and wheezing.   Cardiovascular:  Negative for chest pain, palpitations and leg swelling.  Gastrointestinal:  Negative for abdominal pain, constipation, diarrhea, nausea and vomiting.  Genitourinary: Negative.  Negative for difficulty urinating, dysuria and hematuria.  Musculoskeletal: Negative.  Negative for arthralgias and myalgias.  Skin: Negative.   Neurological:  Negative for dizziness, weakness, light-headedness and headaches.  Hematological:  Negative for adenopathy. Does not bruise/bleed easily.  Psychiatric/Behavioral: Negative.      Objective:  BP 136/86 (BP Location: Left Arm, Patient Position: Sitting, Cuff  Size: Normal)   Pulse 84   Temp 98.3 F (36.8 C) (Oral)   Ht 5' 11 (1.803 m)   Wt 253 lb 3.2 oz (114.9 kg)   SpO2 98%   BMI 35.31 kg/m   BP Readings from Last 3 Encounters:  04/06/24 136/86  02/11/24 (!) 144/92  01/01/24 (!) 160/92    Wt Readings from Last 3 Encounters:  04/06/24 253 lb 3.2 oz (114.9 kg)  02/11/24 252 lb 3.2 oz (114.4 kg)  01/01/24 251 lb 12.8 oz (114.2 kg)    Physical Exam Vitals reviewed.  Constitutional:      General: He is not in acute distress.     Appearance: He is ill-appearing. He is not toxic-appearing or diaphoretic.  HENT:     Nose: Nose normal.     Mouth/Throat:     Mouth: Mucous membranes are moist.  Eyes:     General: No scleral icterus.    Conjunctiva/sclera: Conjunctivae normal.  Cardiovascular:     Rate and Rhythm: Normal rate and regular rhythm.     Heart sounds: No murmur heard.    No friction rub. No gallop.  Pulmonary:     Effort: Pulmonary effort is normal.     Breath sounds: No stridor. No wheezing, rhonchi or rales.  Abdominal:     General: Abdomen is flat.     Palpations: There is no mass.     Tenderness: There is no abdominal tenderness. There is no guarding or rebound.     Hernia: No hernia is present.  Musculoskeletal:        General: Normal range of motion.     Cervical back: Neck supple.     Right lower leg: No edema.     Left lower leg: No edema.  Lymphadenopathy:     Cervical: No cervical adenopathy.  Skin:    General: Skin is warm and dry.  Neurological:     General: No focal deficit present.     Mental Status: He is alert.  Psychiatric:        Mood and Affect: Mood normal.        Behavior: Behavior normal.     Lab Results  Component Value Date   WBC 5.7 02/11/2024   HGB 13.4 02/11/2024   HCT 41.7 02/11/2024   PLT 501 (H) 02/11/2024   GLUCOSE 142 (H) 04/06/2024   CHOL 185 01/01/2024   TRIG 106.0 01/01/2024   HDL 42.10 01/01/2024   LDLDIRECT 145.0 08/26/2014   LDLCALC 121 (H) 01/01/2024   ALT 17 05/13/2023   AST 18 05/13/2023   NA 145 04/06/2024   K 3.8 04/06/2024   CL 106 04/06/2024   CREATININE 1.68 (H) 04/06/2024   BUN 22 04/06/2024   CO2 29 04/06/2024   TSH 2.44 01/01/2024   PSA 0.72 01/01/2024   INR 1.0 08/19/2018   HGBA1C 6.3 04/06/2024   MICROALBUR 3.0 (H) 04/06/2024    ECHOCARDIOGRAM COMPLETE Result Date: 03/23/2024    ECHOCARDIOGRAM REPORT   Patient Name:   James Sawyer Date of Exam: 03/23/2024 Medical Rec #:  [id number]     Height:       71.0 in Accession #:     [id number]    Weight:       252.2 lb Date of Birth:  [birth year]      BSA:          2.327 m Patient Age:    73 years      BP:  144/92 mmHg Patient Gender: M             HR:           70 bpm. Exam Location:  [address] Procedure: 2D Echo, Cardiac Doppler, Color Doppler and Intracardiac            Opacification Agent (Both Spectral and Color Flow Doppler were            utilized during procedure). Indications:    CAD Native Vessel I25.10  History:        Patient has prior history of Echocardiogram examinations, most                 recent 02/02/2021. CAD, COPD and CKD, Arrythmias:Bradycardia;                 Risk Factors:Hypertension and Dyslipidemia.  Sonographer:    Augustin Seals RDCS Referring Phys: [id number] SHENG L HALEY IMPRESSIONS  1. Apical hypertrophy th a spade-like appearance. Consistent with apical variant hypertrophic cardiomyopathy. Consider cardiac MRI. Left ventricular ejection fraction, by estimation, is 65 to 70%. The left ventricle has normal function. The left ventricle has no regional wall motion abnormalities. There is moderate concentric left ventricular hypertrophy. Left ventricular diastolic parameters are consistent with Grade I diastolic dysfunction (impaired relaxation).  2. Right ventricular systolic function is normal. The right ventricular size is normal.  3. The mitral valve is normal in structure. No evidence of mitral valve regurgitation. No evidence of mitral stenosis.  4. The aortic valve is normal in structure. Aortic valve regurgitation is not visualized. No aortic stenosis is present.  5. The inferior vena cava is dilated in size with >50% respiratory variability, suggesting right atrial pressure of 8 mmHg. FINDINGS  Left Ventricle: Apical hypertrophy th a spade-like appearance. Consistent with apical variant hypertrophic cardiomyopathy. Consider cardiac MRI. Left ventricular ejection fraction, by estimation, is 65 to 70%. The left ventricle has normal function.  The  left ventricle has no regional wall motion abnormalities. Definity  contrast agent was given IV to delineate the left ventricular endocardial borders. The left ventricular internal cavity size was normal in size. There is moderate concentric left ventricular hypertrophy. Left ventricular diastolic parameters are consistent with Grade I diastolic dysfunction (impaired relaxation). Right Ventricle: The right ventricular size is normal. No increase in right ventricular wall thickness. Right ventricular systolic function is normal. Left Atrium: Left atrial size was normal in size. Right Atrium: Right atrial size was normal in size. Pericardium: There is no evidence of pericardial effusion. Mitral Valve: The mitral valve is normal in structure. No evidence of mitral valve regurgitation. No evidence of mitral valve stenosis. Tricuspid Valve: The tricuspid valve is normal in structure. Tricuspid valve regurgitation is not demonstrated. No evidence of tricuspid stenosis. Aortic Valve: The aortic valve is normal in structure. Aortic valve regurgitation is not visualized. No aortic stenosis is present. Pulmonic Valve: The pulmonic valve was normal in structure. Pulmonic valve regurgitation is not visualized. No evidence of pulmonic stenosis. Aorta: The aortic root is normal in size and structure. Venous: The inferior vena cava is dilated in size with greater than 50% respiratory variability, suggesting right atrial pressure of 8 mmHg. IAS/Shunts: No atrial level shunt detected by color flow Doppler.  LEFT VENTRICLE PLAX 2D LVIDd:         5.00 cm   Diastology LVIDs:         3.60 cm   LV e' medial:    5.34 cm/s LV PW:  1.30 cm   LV E/e' medial:  13.1 LV IVS:        1.30 cm   LV e' lateral:   7.27 cm/s LVOT diam:     2.00 cm   LV E/e' lateral: 9.6 LV SV:         61 LV SV Index:   26 LVOT Area:     3.14 cm  RIGHT VENTRICLE             IVC RV Basal diam:  3.90 cm     IVC diam: 2.60 cm RV Mid diam:    3.70 cm RV S  prime:     11.00 cm/s  PULMONARY VEINS TAPSE (M-mode): 1.7 cm      A Reversal Velocity: 24.60 cm/s                             Diastolic Velocity:  48.60 cm/s                             S/D Velocity:        1.00                             Systolic Velocity:   47.00 cm/s LEFT ATRIUM             Index        RIGHT ATRIUM           Index LA diam:        4.60 cm 1.98 cm/m   RA Area:     14.10 cm LA Vol (A2C):   30.2 ml 12.98 ml/m  RA Volume:   35.90 ml  15.43 ml/m LA Vol (A4C):   48.5 ml 20.84 ml/m LA Biplane Vol: 39.2 ml 16.85 ml/m  AORTIC VALVE LVOT Vmax:   116.00 cm/s LVOT Vmean:  69.700 cm/s LVOT VTI:    0.195 m  AORTA Ao Root diam: 3.00 cm Ao Asc diam:  3.60 cm MITRAL VALVE MV Area (PHT): 3.03 cm    SHUNTS MV Decel Time: 250 msec    Systemic VTI:  0.20 m MV E velocity: 69.95 cm/s  Systemic Diam: 2.00 cm MV A velocity: 81.55 cm/s MV E/A ratio:  0.86 Annabella Scarce MD Electronically signed by Annabella Scarce MD Signature Date/Time: 03/23/2024/3:46:24 PM    Final    Estimated Creatinine Clearance: 50.5 mL/min (A) (by C-G formula based on SCr of 1.68 mg/dL (H)).   Assessment & Plan:   Type 2 diabetes mellitus with stage 3a chronic kidney disease, without long-term current use of insulin (HCC)- Blood sugar is well controlled. -     Basic metabolic panel with GFR; Future -     Hemoglobin A1c; Future -     Urinalysis, Routine w reflex microscopic; Future -     Microalbumin / creatinine urine ratio; Future -     HM Diabetes Foot Exam  CKD stage 3b, GFR 30-44 ml/min (HCC)- Renal function has improved. -     Basic metabolic panel with GFR; Future -     Urinalysis, Routine w reflex microscopic; Future -     Microalbumin / creatinine urine ratio; Future  Essential hypertension- BP is well controlled. -     Basic metabolic panel with GFR; Future -     Urinalysis, Routine w reflex microscopic; Future  Coronary  artery disease due to lipid rich plaque -     Atorvastatin  Calcium ; Take 1 tablet (80  mg total) by mouth daily.  Dispense: 90 tablet; Refill: 0 -     Ezetimibe ; Take 1 tablet (10 mg total) by mouth daily.  Dispense: 90 tablet; Refill: 0  Hyperlipidemia with target LDL less than 100 -     Atorvastatin  Calcium ; Take 1 tablet (80 mg total) by mouth daily.  Dispense: 90 tablet; Refill: 0 -     Ezetimibe ; Take 1 tablet (10 mg total) by mouth daily.  Dispense: 90 tablet; Refill: 0     Follow-up: Return in about 3 months (around 07/05/2024).  Debby Molt, MD "

## 2024-04-06 NOTE — Patient Instructions (Signed)

## 2024-04-07 ENCOUNTER — Other Ambulatory Visit: Payer: Self-pay | Admitting: Internal Medicine

## 2024-04-07 NOTE — Addendum Note (Signed)
 Addended by: JOSHUA DEBBY CROME on: 04/07/2024 12:04 PM   Modules accepted: Level of Service

## 2024-04-08 ENCOUNTER — Encounter: Payer: Self-pay | Admitting: Pharmacist

## 2024-04-08 NOTE — Progress Notes (Signed)
 Pharmacy Quality Measure Review  This patient is appearing on a report for being at risk of failing the Controlling Blood Pressure measure this calendar year.   Last documented BP 136/86 on 04/06/24  No further action needed.  Darrelyn Drum, PharmD, BCPS, CPP Clinical Pharmacist Practitioner [facility] [phone]

## 2024-04-10 NOTE — Progress Notes (Signed)
 James Sawyer                                          MRN: [id number]   04/10/2024   The VBCI Quality Team Specialist reviewed this patient medical record for the purposes of chart review for care gap closure. The following were reviewed: abstraction for care gap closure-kidney health evaluation for diabetes:eGFR  and uACR.    VBCI Quality Team

## 2024-04-13 ENCOUNTER — Other Ambulatory Visit: Payer: Self-pay

## 2024-04-13 NOTE — Patient Outreach (Signed)
 LCSW spoke with patient for scheduled visit. LCSW discussed the reason for the referral for possible depression and caregiver stress. Patient explained that he is not feeling depressed and reports no SI/HI thoughts. Patient explained that it was mentioned a couple months ago at his PCP visit. Patient reports no caregiver stress at this time.Patient reports that he cares for himself. LCSW reviewed SDOH screening which was negative. Patient had a PHQ2 and 9 completed on 04/06/2024 and PHQ2 was negative and the PHQ9 scored very mild. LCSW explained VBCI services and patient explained that he is not seeking any treatment at this time. Patient agreed for LCSW to call him back in one month. Another visit scheduled for 05/11/2024 at 11:00 am.  Olam Ally, MSW, LCSW [place]  [facility], [facility] Licensed Clinical Social Worker Direct Dial: [phone]

## 2024-04-13 NOTE — Patient Instructions (Signed)
 Visit Information  Thank you for taking time to visit with me today. Please don't hesitate to contact me if I can be of assistance to you before our next scheduled appointment.  Our next appointment is by telephone on 05/11/2024 at 11:00 am Please call the care guide team at [phone] if you need to cancel or reschedule your appointment.     Please call the Suicide and Crisis Lifeline: 988 if you are experiencing a Mental Health or Behavioral Health Crisis or need someone to talk to.  Olam Ally, MSW, LCSW [place]  [facility], [facility] Licensed Clinical Social Worker Direct Dial: [phone]

## 2024-04-14 ENCOUNTER — Encounter (HOSPITAL_COMMUNITY): Payer: Self-pay

## 2024-04-15 ENCOUNTER — Encounter (HOSPITAL_COMMUNITY): Payer: Self-pay

## 2024-04-15 ENCOUNTER — Ambulatory Visit (HOSPITAL_COMMUNITY)
Admission: RE | Admit: 2024-04-15 | Discharge: 2024-04-15 | Disposition: A | Discharge status: Home / Self Care | Source: Ambulatory Visit | Attending: Cardiology | Admitting: Cardiology

## 2024-04-15 DIAGNOSIS — I422 Other hypertrophic cardiomyopathy: Secondary | ICD-10-CM

## 2024-04-16 ENCOUNTER — Encounter (HOSPITAL_COMMUNITY): Payer: Self-pay | Admitting: Emergency Medicine

## 2024-04-21 ENCOUNTER — Telehealth (HOSPITAL_COMMUNITY): Payer: Self-pay | Admitting: Emergency Medicine

## 2024-04-21 NOTE — Telephone Encounter (Signed)
 Reaching out to patient to offer assistance regarding upcoming cardiac imaging study; pt verbalizes understanding of appt date/time, parking situation and where to check in, pre-test NPO status and medications ordered, and verified current allergies; name and call back number provided for further questions should they arise [place]well [place] RN Navigator Cardiac Imaging [facility] [phone] office [phone] cell

## 2024-04-22 ENCOUNTER — Ambulatory Visit (HOSPITAL_COMMUNITY)
Admission: RE | Admit: 2024-04-22 | Discharge: 2024-04-22 | Disposition: A | Discharge status: Home / Self Care | Source: Ambulatory Visit | Attending: Cardiology | Admitting: Cardiology

## 2024-04-22 DIAGNOSIS — I2510 Atherosclerotic heart disease of native coronary artery without angina pectoris: Secondary | ICD-10-CM | POA: Insufficient documentation

## 2024-04-22 DIAGNOSIS — K8020 Calculus of gallbladder without cholecystitis without obstruction: Secondary | ICD-10-CM | POA: Diagnosis not present

## 2024-04-22 MED ORDER — REGADENOSON 0.4 MG/5ML IV SOLN
INTRAVENOUS | Status: AC
  Filled 2024-04-22: qty 5

## 2024-04-22 MED ORDER — RUBIDIUM RB82 GENERATOR (RUBYFILL)
29.2000 | PACK | Freq: Once | INTRAVENOUS | Status: AC
  Administered 2024-04-22: 29.2 via INTRAVENOUS

## 2024-04-22 MED ORDER — REGADENOSON 0.4 MG/5ML IV SOLN
0.4000 mg | Freq: Once | INTRAVENOUS | Status: AC
  Administered 2024-04-22: 0.4 mg via INTRAVENOUS

## 2024-04-22 MED ORDER — RUBIDIUM RB82 GENERATOR (RUBYFILL)
29.5000 | PACK | Freq: Once | INTRAVENOUS | Status: AC
  Administered 2024-04-22: 29.5 via INTRAVENOUS

## 2024-04-23 LAB — NM PET CT CARDIAC PERFUSION MULTI W/ABSOLUTE BLOODFLOW
MBFR: 2.61
Nuc Rest EF: 36 %
Nuc Stress EF: 51 %
Rest MBF: 0.74 ml/g/min
Rest Nuclear Isotope Dose: 29.5 mCi
ST Depression (mm): 0 mm
Stress MBF: 1.93 ml/g/min
Stress Nuclear Isotope Dose: 29.2 mCi
TID: 0.93

## 2024-05-01 ENCOUNTER — Ambulatory Visit: Attending: Cardiology

## 2024-05-01 DIAGNOSIS — I422 Other hypertrophic cardiomyopathy: Secondary | ICD-10-CM

## 2024-05-01 NOTE — Progress Notes (Unsigned)
 Enrolled patient for a 14 day Zio XT monitor to be mailed to patients home  Ross to read

## 2024-05-01 NOTE — Addendum Note (Signed)
 Addended by: Marik Sedore L on: 05/01/2024 02:39 PM   Modules accepted: Orders

## 2024-05-01 NOTE — Progress Notes (Signed)
 Spoke with pt regarding pet results. Regarding cpap, patient is only willing to use the cpap machine if there is assistance with payment. Will send message to sleep pool to assist. 2 wk zio monitor ordered.

## 2024-05-05 NOTE — Progress Notes (Signed)
 James Sawyer                                          MRN: [id number]   05/05/2024   The VBCI Quality Team Specialist reviewed this patient medical record for the purposes of chart review for care gap closure. The following were reviewed: abstraction for care gap closure-controlling blood pressure and kidney health evaluation for diabetes:eGFR  and uACR.    VBCI Quality Team

## 2024-05-11 ENCOUNTER — Other Ambulatory Visit: Payer: Self-pay

## 2024-05-11 NOTE — Patient Outreach (Signed)
 LCSW called patient for month follow up from previous EMMI call. LCSW was unable to reach patient therefore LCSW left voicemail with contact information if patient has any social work needs.  James Sawyer, MSW, LCSW [place]  [facility], [facility] Licensed Clinical Social Worker Direct Dial: [phone]

## 2024-05-20 ENCOUNTER — Ambulatory Visit: Admitting: Internal Medicine

## 2024-07-08 ENCOUNTER — Ambulatory Visit: Admitting: Internal Medicine

## 2024-07-10 ENCOUNTER — Ambulatory Visit
